# Patient Record
Sex: Male | Born: 1935 | Race: White | Hispanic: No | Smoking: Former smoker
Health system: Southern US, Community
[De-identification: ages and names within clinical notes are randomized; demographics above are authoritative.]

## PROBLEM LIST (undated history)

## (undated) DIAGNOSIS — Z8739 Personal history of other diseases of the musculoskeletal system and connective tissue: Secondary | ICD-10-CM

## (undated) DIAGNOSIS — I441 Atrioventricular block, second degree: Secondary | ICD-10-CM

## (undated) DIAGNOSIS — I10 Essential (primary) hypertension: Secondary | ICD-10-CM

## (undated) DIAGNOSIS — Z87442 Personal history of urinary calculi: Secondary | ICD-10-CM

## (undated) DIAGNOSIS — E785 Hyperlipidemia, unspecified: Secondary | ICD-10-CM

## (undated) DIAGNOSIS — M4310 Spondylolisthesis, site unspecified: Secondary | ICD-10-CM

## (undated) DIAGNOSIS — E119 Type 2 diabetes mellitus without complications: Secondary | ICD-10-CM

## (undated) DIAGNOSIS — I4510 Unspecified right bundle-branch block: Secondary | ICD-10-CM

## (undated) DIAGNOSIS — IMO0001 Reserved for inherently not codable concepts without codable children: Secondary | ICD-10-CM

## (undated) DIAGNOSIS — Z950 Presence of cardiac pacemaker: Secondary | ICD-10-CM

## (undated) DIAGNOSIS — N183 Chronic kidney disease, stage 3 unspecified: Secondary | ICD-10-CM

## (undated) DIAGNOSIS — D649 Anemia, unspecified: Secondary | ICD-10-CM

## (undated) DIAGNOSIS — M1990 Unspecified osteoarthritis, unspecified site: Secondary | ICD-10-CM

## (undated) DIAGNOSIS — J189 Pneumonia, unspecified organism: Secondary | ICD-10-CM

## (undated) DIAGNOSIS — C679 Malignant neoplasm of bladder, unspecified: Secondary | ICD-10-CM

## (undated) DIAGNOSIS — H9190 Unspecified hearing loss, unspecified ear: Secondary | ICD-10-CM

## (undated) HISTORY — DX: Anemia, unspecified: D64.9

## (undated) HISTORY — PX: EXCISIONAL HEMORRHOIDECTOMY: SHX1541

## (undated) HISTORY — DX: Unspecified right bundle-branch block: I45.10

## (undated) HISTORY — PX: CYSTOSCOPY WITH LITHOLAPAXY: SHX1425

## (undated) HISTORY — PX: REFRACTIVE SURGERY: SHX103

---

## 2001-07-17 ENCOUNTER — Emergency Department (HOSPITAL_COMMUNITY): Admission: EM | Admit: 2001-07-17 | Discharge: 2001-07-17 | Payer: Self-pay

## 2002-03-13 ENCOUNTER — Encounter: Admission: RE | Admit: 2002-03-13 | Discharge: 2002-06-11 | Payer: Self-pay | Admitting: Internal Medicine

## 2004-03-26 HISTORY — PX: COLONOSCOPY: SHX174

## 2009-02-08 ENCOUNTER — Encounter (INDEPENDENT_AMBULATORY_CARE_PROVIDER_SITE_OTHER): Payer: Self-pay | Admitting: *Deleted

## 2009-07-11 HISTORY — PX: US ECHOCARDIOGRAPHY: HXRAD669

## 2011-02-26 ENCOUNTER — Other Ambulatory Visit: Payer: Self-pay | Admitting: Urology

## 2011-02-26 ENCOUNTER — Ambulatory Visit
Admission: RE | Admit: 2011-02-26 | Discharge: 2011-02-26 | Disposition: A | Discharge status: Home / Self Care | Payer: MEDICARE | Source: Ambulatory Visit | Attending: Urology | Admitting: Urology

## 2011-02-26 DIAGNOSIS — Z01811 Encounter for preprocedural respiratory examination: Secondary | ICD-10-CM

## 2011-02-26 DIAGNOSIS — D494 Neoplasm of unspecified behavior of bladder: Secondary | ICD-10-CM

## 2011-03-02 ENCOUNTER — Other Ambulatory Visit: Payer: Self-pay | Admitting: Urology

## 2011-03-02 ENCOUNTER — Ambulatory Visit (HOSPITAL_BASED_OUTPATIENT_CLINIC_OR_DEPARTMENT_OTHER)
Admission: RE | Admit: 2011-03-02 | Discharge: 2011-03-02 | Disposition: A | Discharge status: Home / Self Care | Payer: MEDICARE | Source: Ambulatory Visit | Attending: Urology | Admitting: Urology

## 2011-03-02 DIAGNOSIS — Z01812 Encounter for preprocedural laboratory examination: Secondary | ICD-10-CM | POA: Insufficient documentation

## 2011-03-02 DIAGNOSIS — R9431 Abnormal electrocardiogram [ECG] [EKG]: Secondary | ICD-10-CM | POA: Insufficient documentation

## 2011-03-02 DIAGNOSIS — C671 Malignant neoplasm of dome of bladder: Secondary | ICD-10-CM | POA: Insufficient documentation

## 2011-03-02 DIAGNOSIS — R310 Gross hematuria: Secondary | ICD-10-CM | POA: Insufficient documentation

## 2011-03-02 DIAGNOSIS — Z01810 Encounter for preprocedural cardiovascular examination: Secondary | ICD-10-CM | POA: Insufficient documentation

## 2011-03-02 HISTORY — PX: TRANSURETHRAL RESECTION OF BLADDER TUMOR: SHX2575

## 2011-03-02 LAB — POCT I-STAT, CHEM 8
BUN: 29 mg/dL — ABNORMAL HIGH (ref 6–23)
Calcium, Ion: 1.36 mmol/L — ABNORMAL HIGH (ref 1.12–1.32)
Chloride: 111 mEq/L (ref 96–112)
Creatinine, Ser: 1.3 mg/dL (ref 0.4–1.5)
Glucose, Bld: 118 mg/dL — ABNORMAL HIGH (ref 70–99)
HCT: 39 % (ref 39.0–52.0)
Hemoglobin: 13.3 g/dL (ref 13.0–17.0)
Potassium: 4.3 mEq/L (ref 3.5–5.1)
Sodium: 143 mEq/L (ref 135–145)
TCO2: 20 mmol/L (ref 0–100)

## 2011-03-05 NOTE — Op Note (Signed)
  Keith Drake, Keith Drake               ACCOUNT NO.:  [id number]  MEDICAL RECORD NO.:  [id number]           PATIENT TYPE:  LOCATION:                                 FACILITY:  PHYSICIAN:  Timmie Dugue C. Karsten Ro, M.D.       DATE OF BIRTH:  DATE OF PROCEDURE:  03/02/2011 DATE OF DISCHARGE:                              OPERATIVE REPORT   PREOPERATIVE DIAGNOSIS:  Bladder tumor.  POSTOPERATIVE DIAGNOSIS:  Bladder tumor.  PROCEDURE:  Transurethral resection of bladder tumor (2.5 cm).  SURGEON:  Vegas Fritze C. Karsten Ro, M.D.  ANESTHESIA:  General.  DRAINS:  None.  SPECIMENS:  Portions of bladder tumor to pathology.  BLOOD LOSS:  Minimal.  COMPLICATIONS:  None.  INDICATIONS:  The patient is a 76 year old male who had an episode of gross hematuria.  He was found by CT scan to have no abnormality of the upper tract, however, cystoscopically I noted what appeared to be a papillary/sessile tumor involving the dome of the bladder.  No other tumors were found within the bladder other than the single tumor at the dome and I, therefore, recommended transurethral resection of this tumor.  I have discussed the risks, complications and alternatives with the patient and he understands and has elected to proceed.  DESCRIPTION OF OPERATION:  After informed consent, the patient was brought to the major OR, placed on the table and administered general anesthesia.  He was then moved to the dorsal lithotomy position and his genitalia was sterilely prepped and draped.  An official time-out was then performed.  The 28-French resectoscope sheath with Timberlake obturator was introduced into the bladder and the obturator removed and replaced with the resectoscope element and 12-degree lens.  A full, systematic inspection of the bladder was then undertaken.  Ureteral orifices were noted to be of normal configuration and position.  There was 3+ trabeculation of the bladder noted as well.  The tumor was identified  at the dome of the bladder again and appeared to have a papillary as well as sessile component.  I resected the bladder tumor from the dome of the bladder and then fulgurated the surrounding mucosa and base of the tumor.  I then used the Microvasive evacuator to remove the portions of bladder tumor and these were sent to pathology.  Reinspection of the bladder revealed no bleeding, perforation, or other abnormality.  I, therefore, drained the bladder, removed the resectoscope and the patient was awakened and taken to recovery room in stable and satisfactory condition.  He tolerated the procedure well with no intraoperative complications.  He will be given a prescription for Pyridium 200 mg #30 and Vicodin HP #28 with followup in my office in 1 week to discuss the results of pathology and in addition written discharge instructions were given at the time of discharge.     Kree Rafter C. Karsten Ro, M.D.     MCO/MEDQ  D:  03/02/2011  T:  03/02/2011  Job:  [id number]  Electronically Signed by Kathie Rhodes M.D. on 03/05/2011 04:30:33 AM

## 2011-05-01 ENCOUNTER — Ambulatory Visit (HOSPITAL_BASED_OUTPATIENT_CLINIC_OR_DEPARTMENT_OTHER)
Admission: RE | Admit: 2011-05-01 | Discharge: 2011-05-01 | Disposition: A | Discharge status: Home / Self Care | Payer: Medicare Other | Source: Ambulatory Visit | Attending: Urology | Admitting: Urology

## 2011-05-01 ENCOUNTER — Other Ambulatory Visit: Payer: Self-pay | Admitting: Urology

## 2011-05-01 DIAGNOSIS — C671 Malignant neoplasm of dome of bladder: Secondary | ICD-10-CM | POA: Insufficient documentation

## 2011-05-01 DIAGNOSIS — I10 Essential (primary) hypertension: Secondary | ICD-10-CM | POA: Insufficient documentation

## 2011-05-01 DIAGNOSIS — I4510 Unspecified right bundle-branch block: Secondary | ICD-10-CM | POA: Insufficient documentation

## 2011-05-01 DIAGNOSIS — E119 Type 2 diabetes mellitus without complications: Secondary | ICD-10-CM | POA: Insufficient documentation

## 2011-05-01 DIAGNOSIS — Z794 Long term (current) use of insulin: Secondary | ICD-10-CM | POA: Insufficient documentation

## 2011-05-01 DIAGNOSIS — I441 Atrioventricular block, second degree: Secondary | ICD-10-CM | POA: Insufficient documentation

## 2011-05-01 DIAGNOSIS — R310 Gross hematuria: Secondary | ICD-10-CM | POA: Insufficient documentation

## 2011-05-01 DIAGNOSIS — N400 Enlarged prostate without lower urinary tract symptoms: Secondary | ICD-10-CM | POA: Insufficient documentation

## 2011-05-01 DIAGNOSIS — I498 Other specified cardiac arrhythmias: Secondary | ICD-10-CM | POA: Insufficient documentation

## 2011-05-01 DIAGNOSIS — Z01812 Encounter for preprocedural laboratory examination: Secondary | ICD-10-CM | POA: Insufficient documentation

## 2011-05-01 HISTORY — PX: OTHER SURGICAL HISTORY: SHX169

## 2011-05-01 LAB — POCT I-STAT 4, (NA,K, GLUC, HGB,HCT)
Glucose, Bld: 112 mg/dL — ABNORMAL HIGH (ref 70–99)
HCT: 40 % (ref 39.0–52.0)
Hemoglobin: 13.6 g/dL (ref 13.0–17.0)
Potassium: 3.9 mEq/L (ref 3.5–5.1)
Sodium: 141 mEq/L (ref 135–145)

## 2011-05-01 LAB — GLUCOSE, CAPILLARY: Glucose-Capillary: 137 mg/dL — ABNORMAL HIGH (ref 70–99)

## 2011-05-06 NOTE — Op Note (Signed)
Keith Drake, Keith Drake               ACCOUNT NO.:  [id number]  MEDICAL RECORD NO.:  [id number]  LOCATION:                                 FACILITY:  PHYSICIAN:  Yehonatan Grandison C. Karsten Drake, M.D.       DATE OF BIRTH:  DATE OF PROCEDURE:  05/01/2011 DATE OF DISCHARGE:                              OPERATIVE REPORT   PREOPERATIVE DIAGNOSIS:  History of transitional cell carcinoma of the bladder.  POSTOPERATIVE DIAGNOSIS:  History of transitional cell carcinoma of the bladder.  PROCEDURE:  Cystoscopy with cold cup and resectional biopsies.  SURGEON:  Doneshia Hill C. Karsten Drake, M.D.  ANESTHESIA:  General.  SPECIMENS: 1. Cold cup biopsies of the previous tumor site. 2. Resectional biopsies of previous tumor site.  These were sent to     pathology.  BLOOD LOSS:  Less than 5 cc.  DRAINS:  None.  COMPLICATIONS:  None.  INDICATIONS:  The patient is a 76 year old male who was found on workup of gross hematuria to have a transitional cell carcinoma involving the dome of the bladder.  This was resected on Mar 02, 2011, and was found to be pathologically high-grade transitional cell carcinoma with invasion into the lamina propria.  We therefore discussed the need for repeat biopsy and deeper resection into the muscularis to be sure he was free of tumor.  The risks, complications, alternatives, and limitations were discussed.  The patient understands and has elected to proceed.  DESCRIPTION OF OPERATION:  After informed consent, the patient was brought to the major OR, placed on table, and administered general anesthesia, and then moved to the dorsal lithotomy position.  His genitalia was sterilely prepped and draped and an official time-out was then performed.  The 22-French cystoscope was then passed under direct vision using 12 degree lens down the urethra which was normal.  The prostatic urethra revealed elongation and bilobar hypertrophy but no lesions were identified.  Upon entering the bladder,  again I noted some trabeculation.  Normal ureteral orifices and using both the 12 and 70 degree lens, the bladder was fully and systematically inspected and the only area of concern was at the dome where I previously resected his bladder tumor.  It appeared to be possibly inflamed as to be expected after having undergone resection in that location fairly recently.  The cold cup biopsy forceps were then introduced and I obtained cold cup biopsies from 3 different locations near the periphery of the previous resection site that appeared possibly suspicious.  I then removed the cystoscope and inserted the resectoscope.  The resectoscope was then used to resect deeper into the base of the previous tumor location and then I used the resectoscope to fulgurate both the base and surrounding mucosa.  No bleeding was noted at the end of the procedure.  I used the Microvasive evacuator to remove all the resected portions of the bladder and these were sent to pathology separately.  The resectoscope was removed after the bladder was drained and the patient was awakened and taken to recovery room in stable and satisfactory condition.  He tolerated the procedure well and there were no intraoperative complications.  He was given written instructions on  discharge and will follow up with me in a week and was given prescriptions for Pyridium 200 #20 and Vicodin #20.     Keith Drake, M.D.     MCO/MEDQ  D:  05/01/2011  T:  05/01/2011  Job:  [id number]  Electronically Signed by Kathie Rhodes M.D. on 05/06/2011 04:48:58 AM

## 2011-09-10 ENCOUNTER — Other Ambulatory Visit: Payer: Self-pay | Admitting: Urology

## 2011-10-01 ENCOUNTER — Encounter (HOSPITAL_BASED_OUTPATIENT_CLINIC_OR_DEPARTMENT_OTHER): Payer: Self-pay | Admitting: *Deleted

## 2011-10-02 ENCOUNTER — Encounter (HOSPITAL_BASED_OUTPATIENT_CLINIC_OR_DEPARTMENT_OTHER): Payer: Self-pay | Admitting: *Deleted

## 2011-10-02 NOTE — Progress Notes (Signed)
NPO AFTER MN. PT ARRIVES AT 0615. NEEDS ISTAT. CURRENT EKG W/ CHART.

## 2011-10-04 NOTE — H&P (Signed)
History of Present Illness      Transitional cell carcinoma of the bladder: He had an episode of gross hematuria and CT scan revealed no abnormality of the upper tracts as well as no pelvic lymphadenopathy. He did have bilateral renal cysts as well as a left lower pole calculus however cystoscopically I found a bladder tumor at the dome of his bladder. On 03/02/11 he underwent TURBT and pathologically was found to have high-grade transitional cell carcinoma with invasion into the lamina propria (stage T1,G3).      Nephrolithiasis: He underwent lithotripsy approximately 20 years ago with no further recurrence of stones.  BPH with outlet obstruction: He was found to have a thickened bladder wall by CT scan consistent with long-standing outlet obstruction which was confirmed cystoscopically with the finding of 3+ trabeculation and bilobar hypertrophy of the prostate.  Nephrolithiasis: He was found on a CT scan done 02/17/11 to have a single nonobstructing stone in the lower pole of his left kidney. No renal calculi were identified.   Past Medical History Problems  1. History of  Diabetes Mellitus 250.00 2. History of  Essential Hypertension 401.9 3. History of  Gout 274.9 4. History of  Hypercholesterolemia 272.0 5. History of  Nephrolithiasis V13.01  Surgical History Problems  1. History of  Cystoscopy With Fulguration Medium Lesion (2-5cm)  Current Meds 1. Allopurinol 300 MG Oral Tablet; Therapy: 29Sep2011 to 2. Crestor 40 MG Oral Tablet; Therapy: [id number] to 3. Glimepiride 4 MG Oral Tablet; Therapy: 28Feb2011 to 4. Januvia 100 MG Oral Tablet; Therapy: [id number] to 5. Lantus SoloStar 100 UNIT/ML Subcutaneous Solution; Therapy: 08Sep2011 to 6. Losartan Potassium 100 MG Oral Tablet; Therapy: [id number] to 7. MetFORMIN HCl 1000 MG Oral Tablet; Therapy: [id number] to 8. Zetia 10 MG Oral Tablet; Therapy: [id number] to  Allergies Medication  1. No Known Drug Allergies  Family  History Problems  1. Family history of  Death In The Family Father died age 41-old age 60. Family history of  Death In The Family Mother died age 58-Hit by drunk driver 3. Family history of  Family Health Status Number Of Children 2 sons 4. Fraternal history of  Nephrolithiasis  Social History Problems  1. Caffeine Use 2 per day 2. Marital History - Currently Married 3. Occupation: Retired 85. Tobacco Use Occassional cigar use Denied  5. History of  Alcohol  Vitals Vital Signs [Data Includes: Last 1 Day]  [id number] 08:30AM  BMI Calculated: 28.92 BSA Calculated: 2.1 Height: 5 ft 10 in Weight: 202 lb  Blood Pressure: 117 / 85 Heart Rate: 66  Physical Exam Constitutional: Well nourished and well developed . No acute distress. The patient appears well hydrated.  ENT:. The ears and nose are normal in appearance.  Neck: The appearance of the neck is normal.  Pulmonary: No respiratory distress.  Cardiovascular: Heart rate and rhythm are normal.  Abdomen: The abdomen is flat. The abdomen is soft and nontender. No suprapubic tenderness. No CVA tenderness. Bowel sounds are normal. No hernias are palpable.  Rectal: Rectal exam demonstrates normal sphincter tone, the anus is normal on inspection. and no tenderness. Estimated prostate size is 2+. Normal rectal tone, no rectal masses, prostate is smooth, symmetric and non-tender. The prostate has no nodularity, is not indurated, is not tender and is not fluctuant. The perineum is normal on inspection, no perineal tenderness.  Genitourinary: Examination of the penis demonstrates no adherence of the prepuce and a normal meatus. The penis is uncircumcised. The scrotum is normal in appearance.  The right testis is palpably normal, not enlarged and non-tender. The left testis is normal, not enlarged and non-tender.  Lymphatics: The femoral and inguinal nodes are not enlarged or tender.  Skin: Normal skin turgor and normal skin color and  pigmentation.  Neuro/Psych:. Mood and affect are appropriate.    Results/Data  The following images/tracing/specimen were independently visualized:  Renal US: 11.42 X 1.03 X 6.26 X 6.23 cm. Upper pole cyst: 1.15 X 0.58 X 0.61 cm. Left kidney: 11.40 X 1.12 X 5.21 X 5.07 cm. Lower pole cyst: 0.85 X 0.78 X 0.77 cm. Cyst with calcifications: 0.81 X 0.61 X 0.97 cm. Bilateral hyperechoic areas w/w/o shadowing.  The following clinical lab reports were reviewed:  UA NMP-22 PSA BUN/creatitine.  PVR: Ultrasound PVR 192.85 ml.  Assessment Assessed  1. Transitional Cell Carcinoma Of The Bladder 188.9      His repeat resection has revealed residual disease that unfortunately is T1,G3 with lymphovascular invasion present. I first discussed with him the fact that this places him at an increased risk for recurrence as well as an increased risk for progression. We therefore discussed the treatment options which would be proceeding with cystectomy at this time or chemoradiation versus induction BCG with repeat resection. He would like to try to avoid either of these first 2 options and we therefore discussed proceeding with full-strength BCG induction therapy for 6 weeks followed by a repeat resection. I went over BCG with him as well as its potential risks and complications. He understands and has elected to proceed in that fashion.  Plan: He presents today to undergo cystoscopy with bladder biopsies to evaluate for the presence of any residual disease status post induction course of BCG.

## 2011-10-05 ENCOUNTER — Encounter (HOSPITAL_BASED_OUTPATIENT_CLINIC_OR_DEPARTMENT_OTHER): Payer: Self-pay | Admitting: *Deleted

## 2011-10-05 ENCOUNTER — Encounter (HOSPITAL_BASED_OUTPATIENT_CLINIC_OR_DEPARTMENT_OTHER): Payer: Self-pay | Admitting: Anesthesiology

## 2011-10-05 ENCOUNTER — Ambulatory Visit (HOSPITAL_BASED_OUTPATIENT_CLINIC_OR_DEPARTMENT_OTHER)
Admission: RE | Admit: 2011-10-05 | Discharge: 2011-10-05 | Disposition: A | Discharge status: Home / Self Care | Payer: Medicare Other | Source: Ambulatory Visit | Attending: Urology | Admitting: Urology

## 2011-10-05 ENCOUNTER — Ambulatory Visit (HOSPITAL_BASED_OUTPATIENT_CLINIC_OR_DEPARTMENT_OTHER): Payer: Medicare Other | Admitting: Anesthesiology

## 2011-10-05 ENCOUNTER — Other Ambulatory Visit: Payer: Self-pay | Admitting: Urology

## 2011-10-05 ENCOUNTER — Encounter (HOSPITAL_BASED_OUTPATIENT_CLINIC_OR_DEPARTMENT_OTHER)
Admission: RE | Disposition: A | Discharge status: Home / Self Care | Payer: Self-pay | Source: Ambulatory Visit | Attending: Urology

## 2011-10-05 DIAGNOSIS — E119 Type 2 diabetes mellitus without complications: Secondary | ICD-10-CM | POA: Insufficient documentation

## 2011-10-05 DIAGNOSIS — C679 Malignant neoplasm of bladder, unspecified: Secondary | ICD-10-CM | POA: Insufficient documentation

## 2011-10-05 DIAGNOSIS — Z87442 Personal history of urinary calculi: Secondary | ICD-10-CM | POA: Insufficient documentation

## 2011-10-05 DIAGNOSIS — N401 Enlarged prostate with lower urinary tract symptoms: Secondary | ICD-10-CM | POA: Insufficient documentation

## 2011-10-05 DIAGNOSIS — M109 Gout, unspecified: Secondary | ICD-10-CM | POA: Insufficient documentation

## 2011-10-05 DIAGNOSIS — I10 Essential (primary) hypertension: Secondary | ICD-10-CM | POA: Insufficient documentation

## 2011-10-05 DIAGNOSIS — E7800 Pure hypercholesterolemia, unspecified: Secondary | ICD-10-CM | POA: Insufficient documentation

## 2011-10-05 DIAGNOSIS — N138 Other obstructive and reflux uropathy: Secondary | ICD-10-CM | POA: Insufficient documentation

## 2011-10-05 DIAGNOSIS — Z79899 Other long term (current) drug therapy: Secondary | ICD-10-CM | POA: Insufficient documentation

## 2011-10-05 HISTORY — PX: CYSTOSCOPY WITH BIOPSY: SHX5122

## 2011-10-05 HISTORY — DX: Hyperlipidemia, unspecified: E78.5

## 2011-10-05 HISTORY — DX: Personal history of other diseases of the musculoskeletal system and connective tissue: Z87.39

## 2011-10-05 HISTORY — DX: Essential (primary) hypertension: I10

## 2011-10-05 HISTORY — DX: Reserved for inherently not codable concepts without codable children: IMO0001

## 2011-10-05 HISTORY — DX: Unspecified osteoarthritis, unspecified site: M19.90

## 2011-10-05 HISTORY — DX: Unspecified hearing loss, unspecified ear: H91.90

## 2011-10-05 LAB — POCT I-STAT 4, (NA,K, GLUC, HGB,HCT)
Glucose, Bld: 103 mg/dL — ABNORMAL HIGH (ref 70–99)
Glucose, Bld: 113 mg/dL — ABNORMAL HIGH (ref 70–99)
HCT: 41 % (ref 39.0–52.0)
HCT: 38 % — ABNORMAL LOW (ref 39.0–52.0)
Hemoglobin: 13.9 g/dL (ref 13.0–17.0)
Hemoglobin: 12.9 g/dL — ABNORMAL LOW (ref 13.0–17.0)
Potassium: 5.6 mEq/L — ABNORMAL HIGH (ref 3.5–5.1)
Potassium: 3.9 mEq/L (ref 3.5–5.1)
Sodium: 142 mEq/L (ref 135–145)
Sodium: 143 mEq/L (ref 135–145)

## 2011-10-05 LAB — GLUCOSE, CAPILLARY: Glucose-Capillary: 78 mg/dL (ref 70–99)

## 2011-10-05 SURGERY — CYSTOSCOPY, WITH BIOPSY
Anesthesia: General | Site: Bladder | Wound class: Clean Contaminated

## 2011-10-05 MED ORDER — LACTATED RINGERS IV SOLN
INTRAVENOUS | Status: DC
  Administered 2011-10-05 (×3): via INTRAVENOUS

## 2011-10-05 MED ORDER — STERILE WATER FOR IRRIGATION IR SOLN
Status: DC | PRN
  Administered 2011-10-05: 1

## 2011-10-05 MED ORDER — GLYCOPYRROLATE 0.2 MG/ML IJ SOLN
INTRAMUSCULAR | Status: DC | PRN
  Administered 2011-10-05: 0.2 mg via INTRAVENOUS

## 2011-10-05 MED ORDER — PROMETHAZINE HCL 25 MG/ML IJ SOLN: 6.2500 mg | INTRAMUSCULAR | Status: DC | PRN

## 2011-10-05 MED ORDER — PROPOFOL 10 MG/ML IV EMUL
INTRAVENOUS | Status: DC | PRN
  Administered 2011-10-05: 50 mg via INTRAVENOUS
  Administered 2011-10-05: 200 mg via INTRAVENOUS
  Administered 2011-10-05: 50 mg via INTRAVENOUS

## 2011-10-05 MED ORDER — PHENAZOPYRIDINE HCL 200 MG PO TABS: 200.0000 mg | ORAL_TABLET | Freq: Three times a day (TID) | ORAL | Status: AC | PRN

## 2011-10-05 MED ORDER — FENTANYL CITRATE 0.05 MG/ML IJ SOLN
INTRAMUSCULAR | Status: DC | PRN
  Administered 2011-10-05 (×3): 50 ug via INTRAVENOUS

## 2011-10-05 MED ORDER — ONDANSETRON HCL 4 MG/2ML IJ SOLN
INTRAMUSCULAR | Status: DC | PRN
  Administered 2011-10-05: 4 mg via INTRAVENOUS

## 2011-10-05 MED ORDER — CIPROFLOXACIN IN D5W 200 MG/100ML IV SOLN
200.0000 mg | INTRAVENOUS | Status: AC
  Administered 2011-10-05: 200 mg via INTRAVENOUS

## 2011-10-05 MED ORDER — LIDOCAINE HCL (CARDIAC) 20 MG/ML IV SOLN
INTRAVENOUS | Status: DC | PRN
  Administered 2011-10-05: 60 mg via INTRAVENOUS

## 2011-10-05 MED ORDER — FENTANYL CITRATE 0.05 MG/ML IJ SOLN: 25.0000 ug | INTRAMUSCULAR | Status: DC | PRN

## 2011-10-05 MED ORDER — LACTATED RINGERS IV SOLN: INTRAVENOUS | Status: DC

## 2011-10-05 MED ORDER — PHENAZOPYRIDINE HCL 200 MG PO TABS
200.0000 mg | ORAL_TABLET | Freq: Once | ORAL | Status: AC
  Administered 2011-10-05: 200 mg via ORAL

## 2011-10-05 SURGICAL SUPPLY — 18 items
BAG DRAIN URO-CYSTO SKYTR STRL (DRAIN) ×2 IMPLANT
BAG DRN UROCATH (DRAIN) ×1
CANISTER SUCT LVC 12 LTR MEDI- (MISCELLANEOUS) ×1 IMPLANT
CLOTH BEACON ORANGE TIMEOUT ST (SAFETY) ×2 IMPLANT
DRAPE CAMERA CLOSED 9X96 (DRAPES) ×2 IMPLANT
ELECT REM PT RETURN 9FT ADLT (ELECTROSURGICAL) ×2
ELECTRODE REM PT RTRN 9FT ADLT (ELECTROSURGICAL) ×1 IMPLANT
GLOVE BIO SURGEON STRL SZ8 (GLOVE) ×2 IMPLANT
GLOVE BIOGEL PI IND STRL 6.5 (GLOVE) IMPLANT
GLOVE BIOGEL PI INDICATOR 6.5 (GLOVE) ×2
GOWN BRE IMP SLV AUR LG STRL (GOWN DISPOSABLE) ×1 IMPLANT
GOWN STRL REIN XL XLG (GOWN DISPOSABLE) ×2 IMPLANT
GOWN XL W/COTTON TOWEL STD (GOWNS) ×2 IMPLANT
IV NS IRRIG 3000ML ARTHROMATIC (IV SOLUTION) ×1 IMPLANT
NEEDLE HYPO 22GX1.5 SAFETY (NEEDLE) IMPLANT
NS IRRIG 500ML POUR BTL (IV SOLUTION) IMPLANT
PACK CYSTOSCOPY (CUSTOM PROCEDURE TRAY) ×2 IMPLANT
WATER STERILE IRR 3000ML UROMA (IV SOLUTION) ×2 IMPLANT

## 2011-10-05 NOTE — Anesthesia Preprocedure Evaluation (Addendum)
Anesthesia Evaluation  Patient identified by MRN, date of birth, ID band Patient awake    Reviewed: Allergy & Precautions, H&P , NPO status , Patient's Chart, lab work & pertinent test results  Airway Mallampati: IV TM Distance: >3 FB Neck ROM: Full    Dental No notable dental hx.    Pulmonary neg pulmonary ROS,  clear to auscultation  Pulmonary exam normal       Cardiovascular hypertension, Pt. on medications neg cardio ROS Regular Normal    Neuro/Psych Negative Neurological ROS  Negative Psych ROS   GI/Hepatic negative GI ROS, Neg liver ROS,   Endo/Other  Negative Endocrine ROSDiabetes mellitus-, Type 2, Oral Hypoglycemic Agents  Renal/GU negative Renal ROS  Genitourinary negative   Musculoskeletal negative musculoskeletal ROS (+)   Abdominal   Peds negative pediatric ROS (+)  Hematology negative hematology ROS (+)   Anesthesia Other Findings   Reproductive/Obstetrics negative OB ROS                          Anesthesia Physical Anesthesia Plan  ASA: III  Anesthesia Plan: General   Post-op Pain Management:    Induction: Intravenous  Airway Management Planned: LMA  Additional Equipment:   Intra-op Plan:   Post-operative Plan: Extubation in OR  Informed Consent: I have reviewed the patients History and Physical, chart, labs and discussed the procedure including the risks, benefits and alternatives for the proposed anesthesia with the patient or authorized representative who has indicated his/her understanding and acceptance.   Dental advisory given  Plan Discussed with: CRNA  Anesthesia Plan Comments:        Anesthesia Quick Evaluation

## 2011-10-05 NOTE — Op Note (Signed)
PATIENT:  Keith Drake  PRE-OPERATIVE DIAGNOSIS: History of transitional cell carcinoma of the bladder.  POST-OPERATIVE DIAGNOSIS: Same  PROCEDURE:  Procedure(s):  cystoscopy with bladder biopsy  SURGEON:  Surgeon(s): Claybon Jabs  ANESTHESIA:   General  EBL:  Minimal  DRAINS:  none  SPECIMEN:  Source of Specimen: bladder biopsies   DISPOSITION OF SPECIMEN:  PATHOLOGY  Indication: He had an episode of gross hematuria and was evaluated with a CT scan which revealed no abnormality of the upper tracts. He was found to have a bladder tumor and underwent TURBT on 03/02/11 and was found to have high-grade transitional cell carcinoma with invasion into the lamina propria but not into the muscularis (stage TI, G3). He underwent an six-week induction course of BCG and presents today for reevaluation with cystoscopy and bladder biopsies.  Description of operation: The patient was taken to the operating room and administered general anesthesia. He was then placed on the table and moved to the dorsal lithotomy position after which his genitalia was sterilely prepped and draped. An official timeout was then performed.  The 61 French cystoscope with 12 lens was then passed under direct vision down the urethra which was noted be entirely normal. The prostatic urethra was free of lesions and the bladder was entered. The ureteral orifices were noted to be of normal configuration and position. There was 3-4+ trabeculation of the bladder and this was photographed. I did a systematic inspection of the entire bladder with both the 12 and 70 lenses. I noted no papillary tumors. There was an area of slight erythema located at the dome/anterior bladder wall it was best seen with the 70 lens. No other lesions were identified within the bladder.  The cold cup biopsy forceps were then passed through the cystoscope and a biopsy from the area of his previous tumor was obtained as well as a biopsy from an area  near the dome as well. These were sent to pathology. I then used the 70 lens and deflecting bridge to better visualize the area and it was fulgurated with Bugbee electrode. I made sure that I fulgurated all of the area that appeared slightly redder than the surrounding mucosa. Once this was completely fulgurated I drained the bladder, removed the cystoscope and the patient was awakened and taken to recovery room. He tolerated the procedure well and there were no intraoperative complications.  PLAN OF CARE: Discharge to home after PACU  PATIENT DISPOSITION:  PACU - hemodynamically stable.

## 2011-10-05 NOTE — Anesthesia Procedure Notes (Signed)
Procedure Name: LMA Insertion Date/Time: 10/05/2011 7:47 AM Performed by: Edwyna Perfect Pre-anesthesia Checklist: Patient identified, Emergency Drugs available, Suction available and Patient being monitored Patient Re-evaluated:Patient Re-evaluated prior to inductionOxygen Delivery Method: Circle System Utilized Preoxygenation: Pre-oxygenation with 100% oxygen Intubation Type: IV induction Ventilation: Mask ventilation without difficulty LMA: LMA with gastric port inserted LMA Size: 4.0 Number of attempts: 1 Placement Confirmation: positive ETCO2 Tube secured with: Tape Dental Injury: Teeth and Oropharynx as per pre-operative assessment

## 2011-10-05 NOTE — Anesthesia Postprocedure Evaluation (Signed)
  Anesthesia Post-op Note  Patient: Keith Drake  Procedure(s) Performed:  CYSTOSCOPY WITH BIOPSY - BLADDER BIOPSY GYRUS DIABETIC  Patient Location: PACU  Anesthesia Type: General  Level of Consciousness: awake and alert   Airway and Oxygen Therapy: Patient Spontanous Breathing  Post-op Pain: mild  Post-op Assessment: Post-op Vital signs reviewed, Patient's Cardiovascular Status Stable, Respiratory Function Stable, Patent Airway and No signs of Nausea or vomiting  Post-op Vital Signs: stable  Complications: No apparent anesthesia complications

## 2011-10-05 NOTE — Transfer of Care (Signed)
Immediate Anesthesia Transfer of Care Note  Patient: Keith Drake  Procedure(s) Performed:  CYSTOSCOPY WITH BIOPSY - BLADDER BIOPSY GYRUS DIABETIC  Patient Location: Patient transported to PACU with oxygen via face mask at 6 Liters / Min  Anesthesia Type: General  Level of Consciousness:  alert and sedated Airway & Oxygen Therapy: Patient Spontanous Breathing and Patient connected to face mask oxygen  Post-op Assessment: Report given to PACU RN and Post -op Vital signs reviewed and stable  Post vital signs: Reviewed and stable  Complications: No apparent anesthesia complications

## 2011-10-06 ENCOUNTER — Encounter (HOSPITAL_BASED_OUTPATIENT_CLINIC_OR_DEPARTMENT_OTHER): Payer: Self-pay | Admitting: Urology

## 2012-01-11 ENCOUNTER — Encounter: Payer: Self-pay | Admitting: *Deleted

## 2013-02-28 ENCOUNTER — Encounter: Payer: Self-pay | Admitting: *Deleted

## 2013-03-23 ENCOUNTER — Encounter: Payer: Self-pay | Admitting: Cardiovascular Disease

## 2013-03-23 ENCOUNTER — Ambulatory Visit (INDEPENDENT_AMBULATORY_CARE_PROVIDER_SITE_OTHER): Payer: Medicare Other | Admitting: Cardiovascular Disease

## 2013-03-23 ENCOUNTER — Encounter (HOSPITAL_COMMUNITY): Payer: Self-pay | Admitting: Cardiovascular Disease

## 2013-03-23 VITALS — BP 110/70 | HR 55 | Ht 70.0 in | Wt 201.0 lb

## 2013-03-23 DIAGNOSIS — I10 Essential (primary) hypertension: Secondary | ICD-10-CM

## 2013-03-23 DIAGNOSIS — Z794 Long term (current) use of insulin: Secondary | ICD-10-CM

## 2013-03-23 DIAGNOSIS — I4510 Unspecified right bundle-branch block: Secondary | ICD-10-CM | POA: Insufficient documentation

## 2013-03-23 DIAGNOSIS — E119 Type 2 diabetes mellitus without complications: Secondary | ICD-10-CM

## 2013-03-23 DIAGNOSIS — R0789 Other chest pain: Secondary | ICD-10-CM

## 2013-03-23 DIAGNOSIS — IMO0001 Reserved for inherently not codable concepts without codable children: Secondary | ICD-10-CM | POA: Insufficient documentation

## 2013-03-23 DIAGNOSIS — E785 Hyperlipidemia, unspecified: Secondary | ICD-10-CM

## 2013-03-23 NOTE — Assessment & Plan Note (Signed)
On statin therapy followed by his premature physician

## 2013-03-23 NOTE — Patient Instructions (Signed)
  Your physician wants you to follow-up with him in : 13 months                                              Your physician has ordered the following tests: [facility] has requested that you have an [place]. For further information please visit [email or web address]. Please follow instruction sheet, as given.

## 2013-03-23 NOTE — Assessment & Plan Note (Signed)
Under good control of her medications

## 2013-03-23 NOTE — Progress Notes (Signed)
03/23/2013 [name]   Mar 02, 1935  [id number]  Primary Physician Jerlyn Ly, MD Primary Cardiologist: Lorretta Harp MD Renae Gloss   HPI:  Mr. Watwood is a 78 year old mildly overweight divorced African American male father of 2, grandfather to 5 grandchildren accompanied by his male companion today. 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 He was referred by Dr. Joylene Draft for cardiovascular evaluation because of right bundle branch block. His cardiac risk factor profile is notable for 2 hypertension, hyperlipidemia and insulin-dependent diabetes.    Current Outpatient Prescriptions  Medication Sig Dispense Refill  . allopurinol (ZYLOPRIM) 300 MG tablet Take 300 mg by mouth every other day.       Marland Kitchen aspirin 81 MG tablet Take 81 mg by mouth every other day.       . ezetimibe (ZETIA) 10 MG tablet Take 10 mg by mouth daily.       . hydrochlorothiazide (HYDRODIURIL) 25 MG tablet 25 mg daily.      . insulin glargine (LANTUS) 100 UNIT/ML injection Inject 4 Units into the skin at bedtime.       Marland Kitchen LOSARTAN POTASSIUM PO Take 100 mg by mouth daily.       . metFORMIN (GLUCOPHAGE) 1000 MG tablet Take 1,000 mg by mouth 2 (two) times daily with a meal.       . minocycline (MINOCIN,DYNACIN) 100 MG capsule 100 mg every other day.      Marland Kitchen NOVOLOG FLEXPEN 100 UNIT/ML SOPN FlexPen 24 Units every morning.      . rosuvastatin (CRESTOR) 40 MG tablet Take 40 mg by mouth every evening.       . sitaGLIPtin (JANUVIA) 100 MG tablet Take 100 mg by mouth. 1/2 tablet daily       No current facility-administered medications for this visit.    No Known Allergies  History   Social History  . Marital Status: Divorced    Spouse Name: N/A    Number of Children: N/A  . Years of Education: N/A   Occupational History  . Not on file.   Social History Main Topics  . Smoking status: Never Smoker   . Smokeless tobacco: Never Used  . Alcohol Use: No   . Drug Use: No  . Sexually Active: Not on file   Other Topics Concern  . Not on file   Social History Narrative  . No narrative on file     Review of Systems: General: negative for chills, fever, night sweats or weight changes.  Cardiovascular: negative for chest pain, dyspnea on exertion, edema, orthopnea, palpitations, paroxysmal nocturnal dyspnea or shortness of breath Dermatological: negative for rash Respiratory: negative for cough or wheezing Urologic: negative for hematuria Abdominal: negative for nausea, vomiting, diarrhea, bright red blood per rectum, melena, or hematemesis Neurologic: negative for visual changes, syncope, or dizziness All other systems reviewed and are otherwise negative except as noted above.    Blood pressure 110/70, pulse 55, height 5\' 10"  (1.778 m), weight 201 lb (91.173 kg).  General appearance: alert and no distress Neck: no adenopathy, no carotid bruit, no JVD, supple, symmetrical, trachea midline and thyroid not enlarged, symmetric, no tenderness/mass/nodules Lungs: clear to auscultation bilaterally Heart: regular rate and rhythm, S1, S2 normal, no murmur, click, rub or gallop Abdomen: soft, non-tender; bowel sounds normal; no masses,  no organomegaly Extremities: extremities normal, atraumatic, no cyanosis or edema Pulses: 2+ and symmetric  EKG sinus bradycardia at 55 with right bundle-branch block and  left axis deviation  ASSESSMENT AND PLAN:   Right bundle branch block The patient was a rare referred for abnormal EKG notable for right bundle branch block. He has complained of some nocturnal chest pain with cardiac risk factors notable for hypertension, hyperlipidemia and diabetes. Based on this, his age and his EKG findings and then to obtain [place] to rule out an ischemic etiology.  Essential hypertension Under good control of her medications  Hyperlipidemia On statin therapy followed by his premature  physician      Lorretta Harp MD [facility], [facility] 03/23/2013 9:50 AM

## 2013-03-23 NOTE — Assessment & Plan Note (Signed)
The patient was a rare referred for abnormal EKG notable for right bundle branch block. He has complained of some nocturnal chest pain with cardiac risk factors notable for hypertension, hyperlipidemia and diabetes. Based on this, his age and his EKG findings and then to obtain [place] to rule out an ischemic etiology.

## 2013-03-29 ENCOUNTER — Ambulatory Visit (HOSPITAL_COMMUNITY)
Admission: RE | Admit: 2013-03-29 | Discharge: 2013-03-29 | Disposition: A | Discharge status: Home / Self Care | Payer: Medicare Other | Source: Ambulatory Visit | Attending: Cardiovascular Disease | Admitting: Cardiovascular Disease

## 2013-03-29 DIAGNOSIS — R079 Chest pain, unspecified: Secondary | ICD-10-CM | POA: Insufficient documentation

## 2013-03-29 DIAGNOSIS — E109 Type 1 diabetes mellitus without complications: Secondary | ICD-10-CM | POA: Insufficient documentation

## 2013-03-29 DIAGNOSIS — I4510 Unspecified right bundle-branch block: Secondary | ICD-10-CM | POA: Insufficient documentation

## 2013-03-29 DIAGNOSIS — Z794 Long term (current) use of insulin: Secondary | ICD-10-CM | POA: Insufficient documentation

## 2013-03-29 DIAGNOSIS — I10 Essential (primary) hypertension: Secondary | ICD-10-CM | POA: Insufficient documentation

## 2013-03-29 DIAGNOSIS — R0789 Other chest pain: Secondary | ICD-10-CM

## 2013-03-29 DIAGNOSIS — E663 Overweight: Secondary | ICD-10-CM | POA: Insufficient documentation

## 2013-03-29 DIAGNOSIS — R42 Dizziness and giddiness: Secondary | ICD-10-CM | POA: Insufficient documentation

## 2013-03-29 MED ORDER — TECHNETIUM TC 99M SESTAMIBI GENERIC - CARDIOLITE
30.4000 | Freq: Once | INTRAVENOUS | Status: AC | PRN
  Administered 2013-03-29: 30 via INTRAVENOUS

## 2013-03-29 MED ORDER — TECHNETIUM TC 99M SESTAMIBI GENERIC - CARDIOLITE
10.5000 | Freq: Once | INTRAVENOUS | Status: AC | PRN
  Administered 2013-03-29: 11 via INTRAVENOUS

## 2013-03-29 MED ORDER — REGADENOSON 0.4 MG/5ML IV SOLN
0.4000 mg | Freq: Once | INTRAVENOUS | Status: AC
  Administered 2013-03-29: 0.4 mg via INTRAVENOUS

## 2013-03-29 NOTE — Procedures (Addendum)
[facility] [address] [place] [address] [id number]  Cardiology Nuclear Med Study  Keith Drake is a 78 y.o. male     MRN : [id number]     DOB: 1935-07-26  Procedure Date: 03/29/2013  Nuclear Med Background Indication for Stress Test:  Evaluation for Ischemia and Abnormal EKG History:  NO PRIOR HISTORY Cardiac Risk Factors: Hypertension, IDDM Type 1, Lipids, Overweight and RBBB  Symptoms:  Chest Pain, Dizziness and Light-Headedness   Nuclear Pre-Procedure Caffeine/Decaff Intake:  7:00pm NPO After: 5:00am   IV Site: R Hand  IV 0.9% NS with Angio Cath:  22g  Chest Size (in):  44"  IV Started by: Azucena Cecil, RN  Height: 5\' 10"  (1.778 m)  Cup Size: n/a  BMI:  Body mass index is 28.84 kg/(m^2). Weight:  201 lb (91.173 kg)   Tech Comments:  N/A    Nuclear Med Study 1 or 2 day study: 1 day  Stress Test Type:  [place]  Order Authorizing Provider:  Quay Burow, MD   Resting Radionuclide: Technetium 41m Sestamibi  Resting Radionuclide Dose: 10.5 mCi   Stress Radionuclide:  Technetium 43m Sestamibi  Stress Radionuclide Dose: 30.4 mCi           Stress Protocol Rest HR: 49 Stress HR: 65  Rest BP: 142/81 Stress BP: 133/83  Exercise Time (min): n/a METS: n/a   Predicted Max HR: 144 bpm % Max HR: 45.14 bpm Rate Pressure Product: 9230  Dose of Adenosine (mg):  n/a Dose of Lexiscan: 0.4 mg  Dose of Atropine (mg): n/a Dose of Dobutamine: n/a mcg/kg/min (at max HR)  Stress Test Technologist: Leane Para, CCT Nuclear Technologist: Imagene Riches, CNMT   Rest Procedure:  Myocardial perfusion imaging was performed at rest 45 minutes following the intravenous administration of Technetium 27m Sestamibi. Stress Procedure:  The patient received IV Lexiscan 0.4 mg over 15-seconds.  Technetium 82m Sestamibi injected at 30-seconds.  There were no significant changes with Lexiscan.  Quantitative spect images were obtained  after a 45 minute delay.  Transient Ischemic Dilatation (Normal <1.22):  1.23 Lung/Heart Ratio (Normal <0.45):  0.27 QGS EDV:  n/a ml QGS ESV:  n/a ml LV Ejection Fraction: Study not gated     Rest ECG: NSR-RBBB and PACs  Stress ECG: No significant change from baseline ECG  QPS Raw Data Images:  Normal; no motion artifact; normal heart/lung ratio. Stress Images:  Normal homogeneous uptake in all areas of the myocardium. Rest Images:  Normal homogeneous uptake in all areas of the myocardium. Subtraction (SDS):  Normal  Impression Exercise Capacity:  Lexiscan with no exercise. BP Response:  Normal blood pressure response. Clinical Symptoms:  No significant symptoms noted. ECG Impression:  No significant ST segment change suggestive of ischemia. Comparison with Prior Nuclear Study: No previous nuclear study performed  Overall Impression:  Normal stress nuclear study.  LV Wall Motion:  Could not be gated due to ectopy   Keith Mcmichael, MD  03/29/2013 1:49 PM

## 2013-04-03 ENCOUNTER — Encounter: Payer: Self-pay | Admitting: *Deleted

## 2013-04-18 ENCOUNTER — Encounter: Payer: Self-pay | Admitting: Cardiovascular Disease

## 2014-02-01 ENCOUNTER — Encounter: Payer: Self-pay | Admitting: Gastroenterology

## 2015-02-15 ENCOUNTER — Encounter: Payer: Self-pay | Admitting: Gastroenterology

## 2015-10-30 ENCOUNTER — Encounter: Payer: Self-pay | Admitting: Gastroenterology

## 2015-11-11 ENCOUNTER — Encounter: Payer: Self-pay | Admitting: Gastroenterology

## 2016-01-16 ENCOUNTER — Ambulatory Visit (AMBULATORY_SURGERY_CENTER): Payer: Self-pay

## 2016-01-16 VITALS — Ht 70.0 in | Wt 203.0 lb

## 2016-01-16 DIAGNOSIS — Z1211 Encounter for screening for malignant neoplasm of colon: Secondary | ICD-10-CM

## 2016-01-16 MED ORDER — NA SULFATE-K SULFATE-MG SULF 17.5-3.13-1.6 GM/177ML PO SOLN: 1.0000 | Freq: Once | ORAL | Status: DC

## 2016-01-16 NOTE — Progress Notes (Signed)
No egg or soy allergies Not on home 02 No previous anesthesia complications No diet or weight loss meds 

## 2016-01-16 NOTE — Addendum Note (Signed)
Addended by: Kasandra Knudsen on: 01/16/2016 03:32 PM   Modules accepted: Orders

## 2016-01-21 ENCOUNTER — Ambulatory Visit: Payer: Medicare Other | Admitting: Gastroenterology

## 2016-01-21 VITALS — Temp 97.3°F

## 2016-01-21 DIAGNOSIS — Z1211 Encounter for screening for malignant neoplasm of colon: Secondary | ICD-10-CM

## 2016-01-21 MED ORDER — NA SULFATE-K SULFATE-MG SULF 17.5-3.13-1.6 GM/177ML PO SOLN: 1.0000 | Freq: Once | ORAL | Status: DC

## 2016-01-21 MED ORDER — SODIUM CHLORIDE 0.9 % IV SOLN: 500.0000 mL | INTRAVENOUS | Status: DC

## 2016-01-21 NOTE — Progress Notes (Signed)
Went over 2 day prep instructions with patient and carepartner. Advised clear liquids only until after the procedure on 01-23-16. Patient and care partner verbalized understanding and agreed to follow instructions. Advised patient/care partner to purchase miralax, dulcolax, and gatorade for 2 day prep. Patient was also given a sample of Suprep. Keith Drake-PV

## 2016-01-21 NOTE — Progress Notes (Signed)
Pt.stated that he is still having solid Alechia Lezama stool after taken both parts of prep,made doctor Fuller Plan aware and he stated that patient need to be rescheduled with a 2 day prep. Explained  To pt. That doctor would not be able to see colon if he is not cleaned out,pt. Verbalize understanding.pt. Scheduled for Thursday 01/23/16 @8 :30 and we are doing previst today with 2 day prep. Pt. Taken to consultation room to give instructions.

## 2016-01-23 ENCOUNTER — Ambulatory Visit (AMBULATORY_SURGERY_CENTER): Payer: Medicare Other | Admitting: Gastroenterology

## 2016-01-23 ENCOUNTER — Encounter: Payer: Self-pay | Admitting: Gastroenterology

## 2016-01-23 VITALS — BP 108/65 | HR 56 | Temp 97.7°F | Resp 16 | Ht 70.0 in | Wt 203.0 lb

## 2016-01-23 DIAGNOSIS — D124 Benign neoplasm of descending colon: Secondary | ICD-10-CM | POA: Diagnosis not present

## 2016-01-23 DIAGNOSIS — Z1211 Encounter for screening for malignant neoplasm of colon: Secondary | ICD-10-CM | POA: Diagnosis present

## 2016-01-23 DIAGNOSIS — D122 Benign neoplasm of ascending colon: Secondary | ICD-10-CM

## 2016-01-23 MED ORDER — SODIUM CHLORIDE 0.9 % IV SOLN: 500.0000 mL | INTRAVENOUS | Status: DC

## 2016-01-23 NOTE — Op Note (Signed)
[name]: Keith Drake Procedure Date: 01/23/2016 8:35 AM MRN: [id number] Endoscopist: Ladene Artist , MD Age: 81 Referring MD:  Date of Birth: [birth year] Gender: Male Procedure:                Colonoscopy Indications:              Screening for colorectal malignant neoplasm Medicines:                Monitored Anesthesia Care Procedure:                Pre-Anesthesia Assessment:                           - Prior to the procedure, a History and Physical                            was performed, and patient medications and                            allergies were reviewed. The patient's tolerance of                            previous anesthesia was also reviewed. The risks                            and benefits of the procedure and the sedation                            options and risks were discussed with the patient.                            All questions were answered, and informed consent                            was obtained. Prior Anticoagulants: The patient has                            taken no previous anticoagulant or antiplatelet                            agents. ASA Grade Assessment: III - A patient with                            severe systemic disease. After reviewing the risks                            and benefits, the patient was deemed in                            satisfactory condition to undergo the procedure.                           After obtaining informed consent, the colonoscope  was passed under direct vision. Throughout the                            procedure, the patient's blood pressure, pulse, and                            oxygen saturations were monitored continuously. The                            Model PCF-H190L [id number]) scope was introduced                            through the anus and advanced to the the cecum,                            identified by appendiceal orifice and  ileocecal                            valve. The colonoscopy was performed without                            difficulty. The patient tolerated the procedure                            well. The quality of the bowel preparation was                            good. The ileocecal valve, appendiceal orifice, and                            rectum were photographed. Scope In: 8:42:50 AM Scope Out: 8:59:03 AM Scope Withdrawal Time: 0 hours 13 minutes 22 seconds  Total Procedure Duration: 0 hours 16 minutes 13 seconds  Findings:      The digital rectal exam was normal.      Two sessile polyps were found in the descending colon and ascending       colon. The polyps were 7 to 8 mm in size. These polyps were removed with       a cold snare. Resection and retrieval were complete.      The exam was otherwise normal throughout the examined colon.      Internal hemorrhoids were found during retroflexion. The hemorrhoids       were moderate and Grade I (internal hemorrhoids that do not prolapse).      No additional abnormalities were found on retroflexion. Complications:            No immediate complications. Estimated Blood Loss:     Estimated blood loss: none. Impression:               - Two 7 to 8 mm polyps in the descending colon and                            in the ascending colon, removed with a cold snare.  Resected and retrieved.                           - Internal hemorrhoids. Recommendation:           - Patient has a contact number available for                            emergencies. The signs and symptoms of potential                            delayed complications were discussed with the                            patient. Return to normal activities tomorrow.                            Written discharge instructions were provided to the                            patient.                           - Resume previous diet.                           - Continue  present medications.                           - Await pathology results.                           - No repeat colonoscopy due to age. Procedure Code(s):        --- Professional ---                           [phone], Colonoscopy, flexible; with removal of                            tumor(s), polyp(s), or other lesion(s) by snare                            technique CPT copyright 2016 American Medical Association. All rights reserved. Ladene Artist, MD 01/23/2016 9:05:51 AM This report has been signed electronically. Number of Addenda: 0 Referring MD:      Crist Infante, MD

## 2016-01-23 NOTE — Progress Notes (Signed)
A/ox3 pleased with MAC, report to Suzanne RN 

## 2016-01-23 NOTE — Progress Notes (Signed)
Discharge instructions given by Riki Sheer, LPN

## 2016-01-23 NOTE — Progress Notes (Signed)
Called to room to assist during endoscopic procedure.  Patient ID and intended procedure confirmed with present staff. Received instructions for my participation in the procedure from the performing physician.  

## 2016-01-23 NOTE — Patient Instructions (Signed)
YOU HAD AN ENDOSCOPIC PROCEDURE TODAY AT THE [facility]:   Refer to the procedure report that was given to you for any specific questions about what was found during the examination.  If the procedure report does not answer your questions, please call your gastroenterologist to clarify.  If you requested that your care partner not be given the details of your procedure findings, then the procedure report has been included in a sealed envelope for you to review at your convenience later.  YOU SHOULD EXPECT: Some feelings of bloating in the abdomen. Passage of more gas than usual.  Walking can help get rid of the air that was put into your GI tract during the procedure and reduce the bloating. If you had a lower endoscopy (such as a colonoscopy or flexible sigmoidoscopy) you may notice spotting of blood in your stool or on the toilet paper. If you underwent a bowel prep for your procedure, you may not have a normal bowel movement for a few days.  Please Note:  You might notice some irritation and congestion in your nose or some drainage.  This is from the oxygen used during your procedure.  There is no need for concern and it should clear up in a day or so.  SYMPTOMS TO REPORT IMMEDIATELY:   Following lower endoscopy (colonoscopy or flexible sigmoidoscopy):  Excessive amounts of blood in the stool  Significant tenderness or worsening of abdominal pains  Swelling of the abdomen that is new, acute  Fever of 100F or higher   For urgent or emergent issues, a gastroenterologist can be reached at any hour by calling [phone].   DIET: Your first meal following the procedure should be a small meal and then it is ok to progress to your normal diet. Heavy or fried foods are harder to digest and may make you feel nauseous or bloated.  Likewise, meals heavy in dairy and vegetables can increase bloating.  Drink plenty of fluids but you should avoid alcoholic beverages for 24  hours.  ACTIVITY:  You should plan to take it easy for the rest of today and you should NOT DRIVE or use heavy machinery until tomorrow (because of the sedation medicines used during the test).    FOLLOW UP: Our staff will call the number listed on your records the next business day following your procedure to check on you and address any questions or concerns that you may have regarding the information given to you following your procedure. If we do not reach you, we will leave a message.  However, if you are feeling well and you are not experiencing any problems, there is no need to return our call.  We will assume that you have returned to your regular daily activities without incident.  If any biopsies were taken you will be contacted by phone or by letter within the next 1-3 weeks.  Please call us at [phone] if you have not heard about the biopsies in 3 weeks.    SIGNATURES/CONFIDENTIALITY: You and/or your care partner have signed paperwork which will be entered into your electronic medical record.  These signatures attest to the fact that that the information above on your After Visit Summary has been reviewed and is understood.  Full responsibility of the confidentiality of this discharge information lies with you and/or your care-partner.  Read all of the handouts given to you by your recovery room nurse. 

## 2016-01-24 ENCOUNTER — Telehealth: Payer: Self-pay | Admitting: *Deleted

## 2016-01-24 NOTE — Telephone Encounter (Signed)
  Follow up Call-  Call back number 01/23/2016 01/21/2016  Post procedure Call Back phone  # [phone]  Permission to leave phone message Yes Yes     Patient questions:  Do you have a fever, pain , or abdominal swelling? No. Pain Score  0 *  Have you tolerated food without any problems? Yes.    Have you been able to return to your normal activities? Yes.    Do you have any questions about your discharge instructions: Diet   No. Medications  No. Follow up visit  No.  Do you have questions or concerns about your Care? No.  Actions: * If pain score is 4 or above: No action needed, pain <4.

## 2016-01-31 ENCOUNTER — Encounter: Payer: Self-pay | Admitting: Gastroenterology

## 2017-07-05 ENCOUNTER — Ambulatory Visit (INDEPENDENT_AMBULATORY_CARE_PROVIDER_SITE_OTHER): Payer: Medicare Other | Admitting: Podiatry

## 2017-07-05 ENCOUNTER — Encounter: Payer: Self-pay | Admitting: Podiatry

## 2017-07-05 VITALS — BP 141/69 | HR 54

## 2017-07-05 DIAGNOSIS — B351 Tinea unguium: Secondary | ICD-10-CM

## 2017-07-05 DIAGNOSIS — E0842 Diabetes mellitus due to underlying condition with diabetic polyneuropathy: Secondary | ICD-10-CM

## 2017-07-05 DIAGNOSIS — M79676 Pain in unspecified toe(s): Secondary | ICD-10-CM | POA: Diagnosis not present

## 2017-07-05 NOTE — Progress Notes (Signed)
   Subjective:    Patient ID: Keith Drake, male    DOB: 08/25/1935, 82 y.o.   MRN: [id number]  HPI  Chief Complaint  Patient presents with  . Diabetic foot care       Review of Systems  All other systems reviewed and are negative.      Objective:   Physical Exam        Assessment & Plan:

## 2017-07-05 NOTE — Progress Notes (Signed)
Patient ID: Keith Drake, male   DOB: 06-Apr-1935, 82 y.o.   MRN: [id number]   SUBJECTIVE Patient with a history of diabetes mellitus presents to office today complaining of elongated, thickened nails. Pain while ambulating in shoes. Patient is unable to trim their own nails.   Past Medical History:  Diagnosis Date  . Anemia   . Arthritis    OCC LEG PAIN  . Chronic kidney disease    stage lll  . Diabetes mellitus    ORAL AND INSULIN MEDS  . History of bladder carcinoma TCC   S/P BCG TX'S   . History of gout CURRENTLY STABLE  . Hyperlipidemia   . Hypertension   . Impaired hearing BILATERAL    OCCASIONAL WEARS HEARING AIDS  . Right bundle branch block     OBJECTIVE General Patient is awake, alert, and oriented x 3 and in no acute distress. Derm Skin is dry and supple bilateral. Negative open lesions or macerations. Remaining integument unremarkable. Nails are tender, long, thickened and dystrophic with subungual debris, consistent with onychomycosis, 1-5 bilateral. No signs of infection noted. Vasc  DP and PT pedal pulses palpable bilaterally. Temperature gradient within normal limits.  Neuro Epicritic and protective threshold sensation diminished bilaterally.  Musculoskeletal Exam No symptomatic pedal deformities noted bilateral. Muscular strength within normal limits.  ASSESSMENT 1. Diabetes Mellitus w/ peripheral neuropathy 2. Onychomycosis of nail due to dermatophyte bilateral 3. Pain in foot bilateral  PLAN OF CARE 1. Patient evaluated today. 2. Instructed to maintain good pedal hygiene and foot care. Stressed importance of controlling blood sugar.  3. Mechanical debridement of nails 1-5 bilaterally performed using a nail nipper. Filed with dremel without incident.  4. Return to clinic in 3 mos.     Edrick Kins, DPM [facility]  Dr. Edrick Kins, [place]                                        [place], [place] [address]                 Office [phone]  Fax [phone]

## 2017-08-31 ENCOUNTER — Other Ambulatory Visit: Payer: Self-pay | Admitting: Nephrology

## 2017-08-31 DIAGNOSIS — N184 Chronic kidney disease, stage 4 (severe): Secondary | ICD-10-CM

## 2017-09-21 ENCOUNTER — Other Ambulatory Visit: Payer: Medicare Other

## 2017-09-24 ENCOUNTER — Ambulatory Visit
Admission: RE | Admit: 2017-09-24 | Discharge: 2017-09-24 | Disposition: A | Discharge status: Home / Self Care | Payer: Medicare Other | Source: Ambulatory Visit | Attending: Nephrology | Admitting: Nephrology

## 2017-09-24 DIAGNOSIS — N184 Chronic kidney disease, stage 4 (severe): Secondary | ICD-10-CM

## 2017-10-04 ENCOUNTER — Ambulatory Visit: Payer: Medicare Other | Admitting: Podiatry

## 2018-04-21 ENCOUNTER — Other Ambulatory Visit: Payer: Self-pay | Admitting: Family Medicine

## 2018-04-21 DIAGNOSIS — N184 Chronic kidney disease, stage 4 (severe): Secondary | ICD-10-CM

## 2018-04-21 DIAGNOSIS — R29898 Other symptoms and signs involving the musculoskeletal system: Secondary | ICD-10-CM

## 2018-04-21 DIAGNOSIS — C679 Malignant neoplasm of bladder, unspecified: Secondary | ICD-10-CM

## 2018-05-02 ENCOUNTER — Ambulatory Visit
Admission: RE | Admit: 2018-05-02 | Discharge: 2018-05-02 | Disposition: A | Discharge status: Home / Self Care | Payer: Medicare Other | Source: Ambulatory Visit | Attending: Family Medicine | Admitting: Family Medicine

## 2018-05-02 DIAGNOSIS — C679 Malignant neoplasm of bladder, unspecified: Secondary | ICD-10-CM

## 2018-05-02 DIAGNOSIS — N184 Chronic kidney disease, stage 4 (severe): Secondary | ICD-10-CM

## 2018-05-02 DIAGNOSIS — R29898 Other symptoms and signs involving the musculoskeletal system: Secondary | ICD-10-CM

## 2018-06-21 ENCOUNTER — Ambulatory Visit (HOSPITAL_COMMUNITY): Admission: RE | Admit: 2018-06-21 | Payer: Medicare Other | Source: Ambulatory Visit

## 2018-06-21 ENCOUNTER — Other Ambulatory Visit (HOSPITAL_COMMUNITY): Payer: Self-pay | Admitting: Nephrology

## 2018-06-21 DIAGNOSIS — I10 Essential (primary) hypertension: Secondary | ICD-10-CM

## 2018-07-06 ENCOUNTER — Ambulatory Visit (HOSPITAL_COMMUNITY)
Admission: RE | Admit: 2018-07-06 | Discharge: 2018-07-06 | Disposition: A | Discharge status: Home / Self Care | Payer: Medicare Other | Source: Ambulatory Visit | Attending: Nephrology | Admitting: Nephrology

## 2018-07-06 DIAGNOSIS — I10 Essential (primary) hypertension: Secondary | ICD-10-CM

## 2018-07-06 DIAGNOSIS — I129 Hypertensive chronic kidney disease with stage 1 through stage 4 chronic kidney disease, or unspecified chronic kidney disease: Secondary | ICD-10-CM | POA: Insufficient documentation

## 2018-07-06 DIAGNOSIS — M109 Gout, unspecified: Secondary | ICD-10-CM | POA: Diagnosis not present

## 2018-07-06 DIAGNOSIS — D631 Anemia in chronic kidney disease: Secondary | ICD-10-CM | POA: Insufficient documentation

## 2018-07-06 DIAGNOSIS — N281 Cyst of kidney, acquired: Secondary | ICD-10-CM | POA: Insufficient documentation

## 2018-07-06 DIAGNOSIS — I774 Celiac artery compression syndrome: Secondary | ICD-10-CM | POA: Insufficient documentation

## 2018-07-06 DIAGNOSIS — C679 Malignant neoplasm of bladder, unspecified: Secondary | ICD-10-CM | POA: Insufficient documentation

## 2018-07-06 DIAGNOSIS — N183 Chronic kidney disease, stage 3 (moderate): Secondary | ICD-10-CM | POA: Insufficient documentation

## 2018-07-06 NOTE — Progress Notes (Signed)
Renal artery duplex completed:  No evidence of renal artery stenosis.   Keith Drake 07/06/2018 10:41 AM

## 2018-07-12 ENCOUNTER — Other Ambulatory Visit: Payer: Self-pay | Admitting: Neurosurgery

## 2018-07-19 ENCOUNTER — Encounter (HOSPITAL_COMMUNITY): Payer: Self-pay

## 2018-07-19 NOTE — Pre-Procedure Instructions (Signed)
Keith Drake  07/19/2018      [id number] - [place], [place] [address] [place] [address] Phone: [phone] Fax: [phone]    Your procedure is scheduled on Monday September 30.  Report to [facility] at 5:30 A.M.  Call this number if you have problems the morning of surgery:  [phone]   Remember:  Do not eat or drink after midnight.    Take these medicines the morning of surgery with A SIP OF WATER:   Allopurinol (Zyloprim) Amlodipine (Norvasc) Minocycline (Minocin) Acetaminophen (Tylenol) if needed  TAKE 70% of Humolog 70/30 the day before surgery. (Morning- 28 units, Evening- 10 units)  DO NOT TAKE Humolog 70/30 the day of surgery  7 days prior to surgery STOP taking any Aleve, Naproxen, Ibuprofen, Motrin, Advil, Goody's, BC's, all herbal medications, fish oil, and all vitamins  FOLLOW your surgeon's instructions on stopping Aspirin. If no instructions were given, please call your surgeon's office.       How to Manage Your Diabetes Before and After Surgery  Why is it important to control my blood sugar before and after surgery? . Improving blood sugar levels before and after surgery helps healing and can limit problems. . A way of improving blood sugar control is eating a healthy diet by: o  Eating less sugar and carbohydrates o  Increasing activity/exercise o  Talking with your doctor about reaching your blood sugar goals . High blood sugars (greater than 180 mg/dL) can raise your risk of infections and slow your recovery, so you will need to focus on controlling your diabetes during the weeks before surgery. . Make sure that the doctor who takes care of your diabetes knows about your planned surgery including the date and location.  How do I manage my blood sugar before surgery? . Check your blood sugar at least 4 times a day, starting 2 days before  surgery, to make sure that the level is not too high or low. o Check your blood sugar the morning of your surgery when you wake up and every 2 hours until you get to the Short Stay unit. . If your blood sugar is less than 70 mg/dL, you will need to treat for low blood sugar: o Do not take insulin. o Treat a low blood sugar (less than 70 mg/dL) with  cup of clear juice (cranberry or apple), 4 glucose tablets, OR glucose gel. Recheck blood sugar in 15 minutes after treatment (to make sure it is greater than 70 mg/dL). If your blood sugar is not greater than 70 mg/dL on recheck, call [phone] o  for further instructions. . Report your blood sugar to the short stay nurse when you get to Short Stay.  . If you are admitted to the hospital after surgery: o Your blood sugar will be checked by the staff and you will probably be given insulin after surgery (instead of oral diabetes medicines) to make sure you have good blood sugar levels. o The goal for blood sugar control after surgery is 80-180 mg/dL.              Do not wear jewelry, make-up or nail polish.  Do not wear lotions, powders, or perfumes, or deodorant.  Do not shave 48 hours prior to surgery.  Men may shave face and neck.  Do not bring valuables to the hospital.  [facility]  is not responsible for any belongings or valuables.  Contacts, dentures or bridgework may not be worn into surgery.  Leave your suitcase in the car.  After surgery it may be brought to your room.  For patients admitted to the hospital, discharge time will be determined by your treatment team.  Patients discharged the day of surgery will not be allowed to drive home.   Special instructions:    [facility]- Preparing For Surgery  Before surgery, you can play an important role. Because skin is not sterile, your skin needs to be as free of germs as possible. You can reduce the number of germs on your skin by washing with CHG (chlorahexidine gluconate)  Soap before surgery.  CHG is an antiseptic cleaner which kills germs and bonds with the skin to continue killing germs even after washing.    Oral Hygiene is also important to reduce your risk of infection.  Remember - BRUSH YOUR TEETH THE MORNING OF SURGERY WITH YOUR REGULAR TOOTHPASTE  Please do not use if you have an allergy to CHG or antibacterial soaps. If your skin becomes reddened/irritated stop using the CHG.  Do not shave (including legs and underarms) for at least 48 hours prior to first CHG shower. It is OK to shave your face.  Please follow these instructions carefully.   1. Shower the NIGHT BEFORE SURGERY and the MORNING OF SURGERY with CHG.   2. If you chose to wash your hair, wash your hair first as usual with your normal shampoo.  3. After you shampoo, rinse your hair and body thoroughly to remove the shampoo.  4. Use CHG as you would any other liquid soap. You can apply CHG directly to the skin and wash gently with a scrungie or a clean washcloth.   5. Apply the CHG Soap to your body ONLY FROM THE NECK DOWN.  Do not use on open wounds or open sores. Avoid contact with your eyes, ears, mouth and genitals (private parts). Wash Face and genitals (private parts)  with your normal soap.  6. Wash thoroughly, paying special attention to the area where your surgery will be performed.  7. Thoroughly rinse your body with warm water from the neck down.  8. DO NOT shower/wash with your normal soap after using and rinsing off the CHG Soap.  9. Pat yourself dry with a CLEAN TOWEL.  10. Wear CLEAN PAJAMAS to bed the night before surgery, wear comfortable clothes the morning of surgery  11. Place CLEAN SHEETS on your bed the night of your first shower and DO NOT SLEEP WITH PETS.    Day of Surgery:  Do not apply any deodorants/lotions.  Please wear clean clothes to the hospital/surgery center.   Remember to brush your teeth WITH YOUR REGULAR TOOTHPASTE.    Please read over  the following fact sheets that you were given. Coughing and Deep Breathing, MRSA Information and Surgical Site Infection Prevention

## 2018-07-20 ENCOUNTER — Emergency Department (HOSPITAL_BASED_OUTPATIENT_CLINIC_OR_DEPARTMENT_OTHER): Payer: Medicare Other

## 2018-07-20 ENCOUNTER — Telehealth: Payer: Self-pay | Admitting: Physician Assistant

## 2018-07-20 ENCOUNTER — Encounter (HOSPITAL_COMMUNITY): Payer: Self-pay

## 2018-07-20 ENCOUNTER — Ambulatory Visit (HOSPITAL_COMMUNITY)
Admission: EM | Disposition: A | Discharge status: Home / Self Care | Payer: Self-pay | Source: Home / Self Care | Attending: Emergency Medicine

## 2018-07-20 ENCOUNTER — Encounter (HOSPITAL_COMMUNITY)
Admission: RE | Admit: 2018-07-20 | Discharge: 2018-07-20 | Disposition: A | Discharge status: Home / Self Care | Payer: Medicare Other | Source: Ambulatory Visit | Attending: Neurosurgery | Admitting: Neurosurgery

## 2018-07-20 ENCOUNTER — Encounter (HOSPITAL_COMMUNITY): Payer: Self-pay | Admitting: Physician Assistant

## 2018-07-20 ENCOUNTER — Other Ambulatory Visit: Payer: Self-pay

## 2018-07-20 ENCOUNTER — Emergency Department (HOSPITAL_COMMUNITY): Payer: Medicare Other

## 2018-07-20 ENCOUNTER — Ambulatory Visit (HOSPITAL_COMMUNITY)
Admission: EM | Admit: 2018-07-20 | Discharge: 2018-07-21 | Disposition: A | Discharge status: Home / Self Care | Payer: Medicare Other | Attending: Cardiovascular Disease | Admitting: Cardiovascular Disease

## 2018-07-20 DIAGNOSIS — I081 Rheumatic disorders of both mitral and tricuspid valves: Secondary | ICD-10-CM | POA: Insufficient documentation

## 2018-07-20 DIAGNOSIS — I441 Atrioventricular block, second degree: Secondary | ICD-10-CM | POA: Diagnosis not present

## 2018-07-20 DIAGNOSIS — Z794 Long term (current) use of insulin: Secondary | ICD-10-CM | POA: Insufficient documentation

## 2018-07-20 DIAGNOSIS — E785 Hyperlipidemia, unspecified: Secondary | ICD-10-CM | POA: Diagnosis not present

## 2018-07-20 DIAGNOSIS — Z8551 Personal history of malignant neoplasm of bladder: Secondary | ICD-10-CM | POA: Insufficient documentation

## 2018-07-20 DIAGNOSIS — M109 Gout, unspecified: Secondary | ICD-10-CM | POA: Insufficient documentation

## 2018-07-20 DIAGNOSIS — E1122 Type 2 diabetes mellitus with diabetic chronic kidney disease: Secondary | ICD-10-CM | POA: Insufficient documentation

## 2018-07-20 DIAGNOSIS — Z9889 Other specified postprocedural states: Secondary | ICD-10-CM | POA: Diagnosis not present

## 2018-07-20 DIAGNOSIS — Z800 Family history of malignant neoplasm of digestive organs: Secondary | ICD-10-CM | POA: Insufficient documentation

## 2018-07-20 DIAGNOSIS — R001 Bradycardia, unspecified: Secondary | ICD-10-CM

## 2018-07-20 DIAGNOSIS — Z01818 Encounter for other preprocedural examination: Secondary | ICD-10-CM | POA: Insufficient documentation

## 2018-07-20 DIAGNOSIS — I442 Atrioventricular block, complete: Secondary | ICD-10-CM | POA: Diagnosis not present

## 2018-07-20 DIAGNOSIS — E119 Type 2 diabetes mellitus without complications: Secondary | ICD-10-CM

## 2018-07-20 DIAGNOSIS — Z79899 Other long term (current) drug therapy: Secondary | ICD-10-CM | POA: Insufficient documentation

## 2018-07-20 DIAGNOSIS — I10 Essential (primary) hypertension: Secondary | ICD-10-CM | POA: Insufficient documentation

## 2018-07-20 DIAGNOSIS — R9431 Abnormal electrocardiogram [ECG] [EKG]: Secondary | ICD-10-CM

## 2018-07-20 DIAGNOSIS — I361 Nonrheumatic tricuspid (valve) insufficiency: Secondary | ICD-10-CM | POA: Diagnosis not present

## 2018-07-20 DIAGNOSIS — Z833 Family history of diabetes mellitus: Secondary | ICD-10-CM | POA: Diagnosis not present

## 2018-07-20 DIAGNOSIS — N183 Chronic kidney disease, stage 3 (moderate): Secondary | ICD-10-CM | POA: Insufficient documentation

## 2018-07-20 DIAGNOSIS — F17290 Nicotine dependence, other tobacco product, uncomplicated: Secondary | ICD-10-CM | POA: Diagnosis not present

## 2018-07-20 DIAGNOSIS — M1990 Unspecified osteoarthritis, unspecified site: Secondary | ICD-10-CM | POA: Diagnosis not present

## 2018-07-20 DIAGNOSIS — Z809 Family history of malignant neoplasm, unspecified: Secondary | ICD-10-CM | POA: Diagnosis not present

## 2018-07-20 DIAGNOSIS — I452 Bifascicular block: Secondary | ICD-10-CM

## 2018-07-20 DIAGNOSIS — Z7982 Long term (current) use of aspirin: Secondary | ICD-10-CM | POA: Insufficient documentation

## 2018-07-20 DIAGNOSIS — I129 Hypertensive chronic kidney disease with stage 1 through stage 4 chronic kidney disease, or unspecified chronic kidney disease: Secondary | ICD-10-CM | POA: Diagnosis not present

## 2018-07-20 DIAGNOSIS — Z950 Presence of cardiac pacemaker: Secondary | ICD-10-CM

## 2018-07-20 HISTORY — PX: PACEMAKER IMPLANT: EP1218

## 2018-07-20 HISTORY — DX: Chronic kidney disease, stage 3 (moderate): N18.3

## 2018-07-20 HISTORY — DX: Type 2 diabetes mellitus without complications: E11.9

## 2018-07-20 HISTORY — DX: Personal history of urinary calculi: Z87.442

## 2018-07-20 HISTORY — PX: INSERT / REPLACE / REMOVE PACEMAKER: SUR710

## 2018-07-20 HISTORY — DX: Chronic kidney disease, stage 3 unspecified: N18.30

## 2018-07-20 HISTORY — DX: Atrioventricular block, second degree: I44.1

## 2018-07-20 HISTORY — DX: Presence of cardiac pacemaker: Z95.0

## 2018-07-20 HISTORY — DX: Pneumonia, unspecified organism: J18.9

## 2018-07-20 HISTORY — DX: Malignant neoplasm of bladder, unspecified: C67.9

## 2018-07-20 LAB — LIPID PANEL
CHOL/HDL RATIO: 4.3 ratio
Cholesterol: 115 mg/dL (ref 0–200)
HDL: 27 mg/dL — AB (ref >40)
LDL Cholesterol: 61 mg/dL (ref 0–99)
Triglycerides: 133 mg/dL (ref <150)
VLDL: 27 mg/dL (ref 0–40)

## 2018-07-20 LAB — COMPREHENSIVE METABOLIC PANEL
ALBUMIN: 3.9 g/dL (ref 3.5–5.0)
ALT: 21 U/L (ref 0–44)
AST: 23 U/L (ref 15–41)
Alkaline Phosphatase: 61 U/L (ref 38–126)
Anion gap: 10 (ref 5–15)
BUN: 33 mg/dL — AB (ref 8–23)
CHLORIDE: 112 mmol/L — AB (ref 98–111)
CO2: 20 mmol/L — ABNORMAL LOW (ref 22–32)
Calcium: 10.5 mg/dL — ABNORMAL HIGH (ref 8.9–10.3)
Creatinine, Ser: 2.08 mg/dL — ABNORMAL HIGH (ref 0.61–1.24)
GFR calc Af Amer: 33 mL/min — ABNORMAL LOW (ref >60)
GFR calc non Af Amer: 28 mL/min — ABNORMAL LOW (ref >60)
GLUCOSE: 57 mg/dL — AB (ref 70–99)
POTASSIUM: 4.3 mmol/L (ref 3.5–5.1)
SODIUM: 142 mmol/L (ref 135–145)
Total Bilirubin: 0.6 mg/dL (ref 0.3–1.2)
Total Protein: 7.1 g/dL (ref 6.5–8.1)

## 2018-07-20 LAB — SURGICAL PCR SCREEN
MRSA, PCR: NEGATIVE
Staphylococcus aureus: NEGATIVE

## 2018-07-20 LAB — GLUCOSE, CAPILLARY
GLUCOSE-CAPILLARY: 130 mg/dL — AB (ref 70–99)
Glucose-Capillary: 103 mg/dL — ABNORMAL HIGH (ref 70–99)
Glucose-Capillary: 159 mg/dL — ABNORMAL HIGH (ref 70–99)
Glucose-Capillary: 290 mg/dL — ABNORMAL HIGH (ref 70–99)

## 2018-07-20 LAB — CBG MONITORING, ED
Glucose-Capillary: 127 mg/dL — ABNORMAL HIGH (ref 70–99)
Glucose-Capillary: 249 mg/dL — ABNORMAL HIGH (ref 70–99)
Glucose-Capillary: 61 mg/dL — ABNORMAL LOW (ref 70–99)
Glucose-Capillary: 165 mg/dL — ABNORMAL HIGH (ref 70–99)
Glucose-Capillary: 137 mg/dL — ABNORMAL HIGH (ref 70–99)
Glucose-Capillary: 52 mg/dL — ABNORMAL LOW (ref 70–99)

## 2018-07-20 LAB — CBC WITH DIFFERENTIAL/PLATELET
Abs Immature Granulocytes: 0 10*3/uL (ref 0.0–0.1)
Basophils Absolute: 0.1 10*3/uL (ref 0.0–0.1)
Basophils Relative: 1 %
Eosinophils Absolute: 0.6 10*3/uL (ref 0.0–0.7)
Eosinophils Relative: 6 %
HEMATOCRIT: 42.4 % (ref 39.0–52.0)
HEMOGLOBIN: 13.2 g/dL (ref 13.0–17.0)
IMMATURE GRANULOCYTES: 0 %
LYMPHS PCT: 40 %
Lymphs Abs: 4 10*3/uL (ref 0.7–4.0)
MCH: 27.8 pg (ref 26.0–34.0)
MCHC: 31.1 g/dL (ref 30.0–36.0)
MCV: 89.3 fL (ref 78.0–100.0)
Monocytes Absolute: 1 10*3/uL (ref 0.1–1.0)
Monocytes Relative: 10 %
NEUTROS PCT: 43 %
Neutro Abs: 4.3 10*3/uL (ref 1.7–7.7)
Platelets: 243 10*3/uL (ref 150–400)
RBC: 4.75 MIL/uL (ref 4.22–5.81)
RDW: 15.1 % (ref 11.5–15.5)
WBC: 9.9 10*3/uL (ref 4.0–10.5)

## 2018-07-20 LAB — TYPE AND SCREEN
ABO/RH(D): B POS
Antibody Screen: NEGATIVE

## 2018-07-20 LAB — APTT: APTT: 27 s (ref 24–36)

## 2018-07-20 LAB — CBC
HCT: 41.2 % (ref 39.0–52.0)
Hemoglobin: 12.9 g/dL — ABNORMAL LOW (ref 13.0–17.0)
MCH: 27.5 pg (ref 26.0–34.0)
MCHC: 31.3 g/dL (ref 30.0–36.0)
MCV: 87.8 fL (ref 78.0–100.0)
Platelets: 226 10*3/uL (ref 150–400)
RBC: 4.69 MIL/uL (ref 4.22–5.81)
RDW: 15 % (ref 11.5–15.5)
WBC: 8.8 10*3/uL (ref 4.0–10.5)

## 2018-07-20 LAB — TROPONIN I: Troponin I: <0.03 ng/mL (ref <0.03)

## 2018-07-20 LAB — BASIC METABOLIC PANEL
Anion gap: 9 (ref 5–15)
BUN: 34 mg/dL — ABNORMAL HIGH (ref 8–23)
CO2: 22 mmol/L (ref 22–32)
Calcium: 10.5 mg/dL — ABNORMAL HIGH (ref 8.9–10.3)
Chloride: 109 mmol/L (ref 98–111)
Creatinine, Ser: 2.07 mg/dL — ABNORMAL HIGH (ref 0.61–1.24)
GFR calc Af Amer: 33 mL/min — ABNORMAL LOW (ref >60)
GFR calc non Af Amer: 28 mL/min — ABNORMAL LOW (ref >60)
GLUCOSE: 81 mg/dL (ref 70–99)
POTASSIUM: 4.2 mmol/L (ref 3.5–5.1)
Sodium: 140 mmol/L (ref 135–145)

## 2018-07-20 LAB — PROTIME-INR
INR: 1.03
Prothrombin Time: 13.4 seconds (ref 11.4–15.2)

## 2018-07-20 LAB — ECHOCARDIOGRAM COMPLETE
Height: 70 in
Weight: 3136 oz

## 2018-07-20 LAB — ABO/RH: ABO/RH(D): B POS

## 2018-07-20 LAB — HEMOGLOBIN A1C
Hgb A1c MFr Bld: 9.2 % — ABNORMAL HIGH (ref 4.8–5.6)
Mean Plasma Glucose: 217.34 mg/dL

## 2018-07-20 SURGERY — PACEMAKER IMPLANT

## 2018-07-20 MED ORDER — SODIUM CHLORIDE 0.9% FLUSH
3.0000 mL | Freq: Two times a day (BID) | INTRAVENOUS | Status: DC
  Administered 2018-07-20: 22:00:00 3 mL via INTRAVENOUS

## 2018-07-20 MED ORDER — HEPARIN (PORCINE) IN NACL 1000-0.9 UT/500ML-% IV SOLN
INTRAVENOUS | Status: AC
  Filled 2018-07-20: qty 500

## 2018-07-20 MED ORDER — LIDOCAINE HCL (PF) 1 % IJ SOLN
INTRAMUSCULAR | Status: DC | PRN
  Administered 2018-07-20: 60 mL

## 2018-07-20 MED ORDER — ONDANSETRON HCL 4 MG/2ML IJ SOLN: 4.0000 mg | Freq: Four times a day (QID) | INTRAMUSCULAR | Status: DC | PRN

## 2018-07-20 MED ORDER — SODIUM CHLORIDE 0.9 % IV SOLN: INTRAVENOUS | Status: DC

## 2018-07-20 MED ORDER — ROSUVASTATIN CALCIUM 20 MG PO TABS
40.0000 mg | ORAL_TABLET | Freq: Every day | ORAL | Status: DC
  Administered 2018-07-21: 09:00:00 40 mg via ORAL
  Filled 2018-07-20: qty 2

## 2018-07-20 MED ORDER — ACETAMINOPHEN 325 MG PO TABS: 325.0000 mg | ORAL_TABLET | ORAL | Status: DC | PRN

## 2018-07-20 MED ORDER — CEFAZOLIN SODIUM-DEXTROSE 2-4 GM/100ML-% IV SOLN
2.0000 g | INTRAVENOUS | Status: AC
  Administered 2018-07-20: 2 g via INTRAVENOUS

## 2018-07-20 MED ORDER — CEFAZOLIN SODIUM-DEXTROSE 2-4 GM/100ML-% IV SOLN
INTRAVENOUS | Status: AC
  Filled 2018-07-20: qty 100

## 2018-07-20 MED ORDER — ALLOPURINOL 100 MG PO TABS
100.0000 mg | ORAL_TABLET | Freq: Every day | ORAL | Status: DC
  Administered 2018-07-21: 09:00:00 100 mg via ORAL
  Filled 2018-07-20: qty 1

## 2018-07-20 MED ORDER — CHLORHEXIDINE GLUCONATE 4 % EX LIQD: 60.0000 mL | Freq: Once | CUTANEOUS | Status: DC

## 2018-07-20 MED ORDER — SODIUM CHLORIDE 0.9 % IV SOLN
INTRAVENOUS | Status: DC
  Administered 2018-07-21: 01:00:00 via INTRAVENOUS

## 2018-07-20 MED ORDER — FENTANYL CITRATE (PF) 100 MCG/2ML IJ SOLN
INTRAMUSCULAR | Status: AC
  Filled 2018-07-20: qty 2

## 2018-07-20 MED ORDER — INSULIN ASPART PROT & ASPART (70-30 MIX) 100 UNIT/ML ~~LOC~~ SUSP
15.0000 [IU] | Freq: Two times a day (BID) | SUBCUTANEOUS | Status: DC
  Administered 2018-07-21: 09:00:00 15 [IU] via SUBCUTANEOUS
  Filled 2018-07-20: qty 10

## 2018-07-20 MED ORDER — LIDOCAINE HCL 1 % IJ SOLN
INTRAMUSCULAR | Status: AC
  Filled 2018-07-20: qty 60

## 2018-07-20 MED ORDER — DEXTROSE 50 % IV SOLN
INTRAVENOUS | Status: AC
  Filled 2018-07-20: qty 50

## 2018-07-20 MED ORDER — ACETAMINOPHEN 500 MG PO TABS: 1000.0000 mg | ORAL_TABLET | Freq: Four times a day (QID) | ORAL | Status: DC | PRN

## 2018-07-20 MED ORDER — MIDAZOLAM HCL 5 MG/5ML IJ SOLN
INTRAMUSCULAR | Status: AC
  Filled 2018-07-20: qty 5

## 2018-07-20 MED ORDER — DEXTROSE 10 % IV SOLN: INTRAVENOUS | Status: DC

## 2018-07-20 MED ORDER — SODIUM CHLORIDE 0.9 % IV SOLN: 250.0000 mL | INTRAVENOUS | Status: DC | PRN

## 2018-07-20 MED ORDER — SODIUM CHLORIDE 0.9 % IV SOLN
INTRAVENOUS | Status: AC
  Filled 2018-07-20: qty 2

## 2018-07-20 MED ORDER — MINOCYCLINE HCL 100 MG PO CAPS
100.0000 mg | ORAL_CAPSULE | Freq: Every day | ORAL | Status: DC
  Administered 2018-07-21: 100 mg via ORAL
  Filled 2018-07-20: qty 1

## 2018-07-20 MED ORDER — HEPARIN (PORCINE) IN NACL 1000-0.9 UT/500ML-% IV SOLN
INTRAVENOUS | Status: DC | PRN
  Administered 2018-07-20: 500 mL

## 2018-07-20 MED ORDER — YOU HAVE A PACEMAKER BOOK
Freq: Once | Status: AC
  Administered 2018-07-20: 22:00:00
  Filled 2018-07-20: qty 1

## 2018-07-20 MED ORDER — CEFAZOLIN SODIUM-DEXTROSE 1-4 GM/50ML-% IV SOLN
1.0000 g | Freq: Three times a day (TID) | INTRAVENOUS | Status: AC
  Administered 2018-07-21 (×2): 1 g via INTRAVENOUS
  Filled 2018-07-20 (×2): qty 50

## 2018-07-20 MED ORDER — ASPIRIN 81 MG PO CHEW
81.0000 mg | CHEWABLE_TABLET | Freq: Every day | ORAL | Status: DC
  Filled 2018-07-20 (×2): qty 1

## 2018-07-20 MED ORDER — SODIUM CHLORIDE 0.9% FLUSH: 3.0000 mL | INTRAVENOUS | Status: DC | PRN

## 2018-07-20 MED ORDER — HYDROCHLOROTHIAZIDE 25 MG PO TABS
25.0000 mg | ORAL_TABLET | Freq: Every day | ORAL | Status: DC
  Administered 2018-07-21: 25 mg via ORAL
  Filled 2018-07-20: qty 1

## 2018-07-20 MED ORDER — SODIUM CHLORIDE 0.9 % IV SOLN
80.0000 mg | INTRAVENOUS | Status: AC
  Administered 2018-07-20: 80 mg

## 2018-07-20 MED ORDER — DEXTROSE 50 % IV SOLN: 50.0000 mL | Freq: Once | INTRAVENOUS | Status: DC

## 2018-07-20 MED ORDER — DEXTROSE 50 % IV SOLN
50.0000 mL | Freq: Once | INTRAVENOUS | Status: AC
  Administered 2018-07-20: 50 mL via INTRAVENOUS

## 2018-07-20 MED ORDER — INSULIN ASPART PROT & ASPART (70-30 MIX) 100 UNIT/ML ~~LOC~~ SUSP: 40.0000 [IU] | Freq: Every day | SUBCUTANEOUS | Status: DC

## 2018-07-20 MED ORDER — AMLODIPINE BESYLATE 10 MG PO TABS
10.0000 mg | ORAL_TABLET | Freq: Every day | ORAL | Status: DC
  Administered 2018-07-21: 10 mg via ORAL
  Filled 2018-07-20: qty 1

## 2018-07-20 SURGICAL SUPPLY — 8 items
CABLE SURGICAL S-101-97-12 (CABLE) ×3 IMPLANT
LEAD TENDRIL MRI 52CM LPA1200M (Lead) ×2 IMPLANT
LEAD TENDRIL MRI 58CM LPA1200M (Lead) ×2 IMPLANT
PACEMAKER ASSURITY DR-RF (Pacemaker) ×2 IMPLANT
PAD PRO RADIOLUCENT 2001M-C (PAD) ×3 IMPLANT
SHEATH CLASSIC 8F (SHEATH) ×4 IMPLANT
SHEATH PROBE COVER 6X72 (BAG) ×2 IMPLANT
TRAY PACEMAKER INSERTION (PACKS) ×3 IMPLANT

## 2018-07-20 NOTE — Consult Note (Addendum)
Cardiology Admission History and Physical:   Patient ID: Keith Drake MRN: [id number]; DOB: January 26, 1935   Admission date: 07/20/2018  Primary Care Provider: Crist Infante, MD Primary Cardiologist: Dr. Gwenlyn Found (last 2014) Primary Electrophysiologist:  None   Chief Complaint:  bradycardia  Patient Profile:   Keith Drake is a 83 y.o. male with PMHx of gout, DM, HTN, HLD, in 2014 was referred to Dr. Gwenlyn Found for RBBB with some symptoms back then had stress test that was negative for ischemia (not a gated study).  History of Present Illness:   Keith Drake was seen today in the pre-op testing/RN for pre-op eval/question for a planned back surgery 07/25/18.  While there he was incidentally found to have CHB on his EKG, asymptomatic, and referred to the ER.  He feels well.  States he came for his pre-op testing for surgery planned next week on his back, and the next thing he knew everyone got crazy.  He denies any kind of CP, palpitations or cardiac awareness.  No near syncope or syncope.,  He is a semi-retired brick layer, and up until about a month ago, had excellent exertional capacity, though of late, when doing physical work he "gives out", this is sudden, but no syncope.  He states he needs to take a 15 minute break to work 10 minutes, and is highly unusual for him.  He reports years of occasional unsteady episodes that he has attributed to low BS.  LABS K+ 4.2 BUN/Creat 34/2.07 WBC 8.8 H/H 13/42 Plts 226   Past Medical History:  Diagnosis Date  . Anemia   . Arthritis    OCC LEG PAIN  . Chronic kidney disease    stage lll  . Diabetes mellitus    ORAL AND INSULIN MEDS  . History of bladder carcinoma TCC   S/P BCG TX'S   . History of gout CURRENTLY STABLE  . Hyperlipidemia   . Hypertension   . Impaired hearing BILATERAL    OCCASIONAL WEARS HEARING AIDS  . Right bundle branch block     Past Surgical History:  Procedure Laterality Date  . COLONOSCOPY  03/2004  . CYSTO /  RESECTIONAL BLADDER BX'S  05-01-2011  . CYSTOSCOPY WITH BIOPSY  10/05/2011   Procedure: CYSTOSCOPY WITH BIOPSY;  Surgeon: Claybon Jabs, MD;  Location: [facility];  Service: Urology;  Laterality: N/A;  BLADDER BIOPSY   . TRANSURETHRAL RESECTION OF BLADDER TUMOR  03-02-2011  . US ECHOCARDIOGRAPHY  07-11-2009   EF 55-60%     Medications Prior to Admission: Prior to Admission medications   Medication Sig Start Date End Date Taking? Authorizing Provider  acetaminophen (TYLENOL) 500 MG tablet Take 1,000 mg by mouth every 6 (six) hours as needed for mild pain.   Yes [provider]  allopurinol (ZYLOPRIM) 100 MG tablet Take 100 mg by mouth daily. 07/07/18  Yes [provider]  amLODipine (NORVASC) 10 MG tablet Take 10 mg by mouth daily.  12/09/15  Yes [provider]  aspirin 81 MG tablet Take 81 mg by mouth daily.    Yes [provider]  HUMULIN 70/30 (70-30) 100 UNIT/ML injection Inject 15-40 Units into the skin 2 (two) times daily with a meal. Inject 40 units with breakfast and 15 units with supper. 12/24/15  Yes [provider]  hydrochlorothiazide (HYDRODIURIL) 25 MG tablet Take 25 mg by mouth daily.   Yes [provider]  minocycline (MINOCIN,DYNACIN) 100 MG capsule Take 100 mg by mouth daily.  02/11/13  Yes [provider]  rosuvastatin (CRESTOR) 40 MG tablet Take 40 mg by mouth daily.    Yes [provider]     Allergies:   No Known Allergies  Social History:   Social History   Socioeconomic History  . Marital status: Divorced    Spouse name: Not on file  . Number of children: Not on file  . Years of education: Not on file  . Highest education level: Not on file  Occupational History  . Not on file  Social Needs  . Financial resource strain: Not on file  . Food insecurity:    Worry: Not on file    Inability: Not on file  . Transportation needs:    Medical: Not on file    Non-medical: Not on  file  Tobacco Use  . Smoking status: Current Some Day Smoker    Packs/day: 0.10    Types: Cigars  . Smokeless tobacco: Never Used  Substance and Sexual Activity  . Alcohol use: No    Alcohol/week: 0.0 standard drinks  . Drug use: No  . Sexual activity: Not on file  Lifestyle  . Physical activity:    Days per week: Not on file    Minutes per session: Not on file  . Stress: Not on file  Relationships  . Social connections:    Talks on phone: Not on file    Gets together: Not on file    Attends religious service: Not on file    Active member of club or organization: Not on file    Attends meetings of clubs or organizations: Not on file    Relationship status: Not on file  . Intimate partner violence:    Fear of current or ex partner: Not on file    Emotionally abused: Not on file    Physically abused: Not on file    Forced sexual activity: Not on file  Other Topics Concern  . Not on file  Social History Narrative  . Not on file    Family History:   The patient's family history includes Cancer in his brother and brother; Colon cancer in his brother; Diabetes in his sister.    ROS:  Please see the history of present illness.  All other ROS reviewed and negative.     Physical Exam/Data:  There were no vitals filed for this visit. No intake or output data in the 24 hours ending 07/20/18 1046 There were no vitals filed for this visit. There is no height or weight on file to calculate BMI.  General:  Well nourished, well developed, in no acute distress HEENT: normal Lymph: no adenopathy Neck: no JVD Endocrine:  No thryomegaly Vascular: No carotid bruits Cardiac:  RRR, bradycardic; no murmurs, gallops or rubs Lungs:  CTA b/l, no wheezing, rhonchi or rales  Abd: soft, nontender  Ext: trace edema Musculoskeletal:  No deformities, BUE and BLE strength normal and equal Skin: warm and dry  Neuro:  No gross focal abnormalities noted Psych:  Normal affect    EKG:  The  ECGs that were done today was personally reviewed and demonstrates  #1 suspect is Mobitz I, RBBB, V rate 35 #2 is Mobitz I, RBBB, V rate 33  03/23/13: SR, RBBB, 55bpm   Relevant CV Studies:  03/29/18: stress myoview Impression Exercise Capacity:  Lexiscan with no exercise. BP Response:  Normal blood pressure response. Clinical Symptoms:  No significant symptoms noted. ECG Impression:  No significant ST segment change suggestive of  ischemia. Comparison with Prior Nuclear Study: No previous nuclear study performed  Overall Impression:  Normal stress nuclear study. LV Wall Motion:  Could not be gated due to ectopy  Laboratory Data:  Chemistry Recent Labs  Lab 07/20/18 0913  NA 140  K 4.2  CL 109  CO2 22  GLUCOSE 81  BUN 34*  CREATININE 2.07*  CALCIUM 10.5*  GFRNONAA 28*  GFRAA 33*  ANIONGAP 9    No results for input(s): PROT, ALBUMIN, AST, ALT, ALKPHOS, BILITOT in the last 168 hours. Hematology Recent Labs  Lab 07/20/18 0913  WBC 8.8  RBC 4.69  HGB 12.9*  HCT 41.2  MCV 87.8  MCH 27.5  MCHC 31.3  RDW 15.0  PLT 226   Cardiac EnzymesNo results for input(s): TROPONINI in the last 168 hours. No results for input(s): TROPIPOC in the last 168 hours.  BNPNo results for input(s): BNP, PROBNP in the last 168 hours.  DDimer No results for input(s): DDIMER in the last 168 hours.  Radiology/Studies:  No results found.  Assessment and Plan:   1. Bradycardia 2. Mobitz I block, known baseline RBBB  Dr. Caryl Comes has seen and examined the patient.  Not completely convinced the patient has symptomatic bradycardia He reports new exertional capacity decline, though when checks his BP at home, HR generally in the 60's-70's by his machine  Will get an echo, the patient's girlfriend is going to get his BP machine for [place] to review what his HR has been of late.  Dr. Caryl Comes, not convinced of need for pacing at this point BP very stable Patient is with no active symptoms  Trop is  negative No clear anginal sounding symptoms  3. Abn BUN/Creat     Unclear if this is new, last available labs from years ago   For questions or updates, please contact [place] Please consult [email or web address] for contact info under        Signed, Baldwin Jamaica, PA-C  07/20/2018 10:46 AM    1.  Mobitz 1 heart block  2.  Right bundle branch left anterior fascicular block-chronic  3.  Change in exercise tolerance about a month ago.  4.  Hypertension.  5.  Renal insufficiency grade 4.    The patient presents without complaints.  He denies dyspnea.  He denies weakness.  He does note however that about a month ago he went from being able to "work me to death "having to rest 15 minutes every 10 minutes he works.  He measures his blood pressure at home.  He has not noted a change in heart rate concurrent with the symptoms.  We will have him get his monitor.  In the event that there is indeed no correlation, pacing for asymptomatic or poorly correlated symptoms is contraindicated in Mobitz 1 heart block.  Hence, we would seek to identify symptomatic correlation with heart block going forward.  It also would beg the question as to what other mechanisms might be responsible for his acute changes in exercise tolerance.  Not withstanding the fact that his troponins are negative, evaluation of his coronary artery bed probably with stress testing given his renal function will be appropriate.  We will follow-up again later today after his wife brings in his home blood pressure recordings (with heart rates)

## 2018-07-20 NOTE — Telephone Encounter (Signed)
Received call from pre-surgical screening team, patient arrived today for lumbar fusion eval. Initial HR reported to be in 60s but EKG revealed HR 35 with RBBB and high grade heart block. He does not appear to be on any AVN blocking agents and the pre-op team corroborates this. BP was stable at 140s/60s and patient is totally asymptomatic. Have advised they escort patient to emergency room with pads in place for further testing. I will notify EP team of his impending arrival. Also spoke with ER triage nurse to make them aware. I would suggest istat labs on arrival to ER.  Dayna Dunn PA-C

## 2018-07-20 NOTE — H&P (View-Only) (Signed)
Cardiology Admission History and Physical:   Patient ID: Ozie Lupe MRN: [id number]; DOB: September 30, 1935   Admission date: 07/20/2018  Primary Care Provider: Crist Infante, MD Primary Cardiologist: Dr. Gwenlyn Found (last 2014) Primary Electrophysiologist:  None   Chief Complaint:  bradycardia  Patient Profile:   Taren Toops is a 83 y.o. male with PMHx of gout, DM, HTN, HLD, in 2014 was referred to Dr. Gwenlyn Found for RBBB with some symptoms back then had stress test that was negative for ischemia (not a gated study).  History of Present Illness:   Mr. Vandyke was seen today in the pre-op testing/RN for pre-op eval/question for a planned back surgery 07/25/18.  While there he was incidentally found to have CHB on his EKG, asymptomatic, and referred to the ER.  He feels well.  States he came for his pre-op testing for surgery planned next week on his back, and the next thing he knew everyone got crazy.  He denies any kind of CP, palpitations or cardiac awareness.  No near syncope or syncope.,  He is a semi-retired brick layer, and up until about a month ago, had excellent exertional capacity, though of late, when doing physical work he "gives out", this is sudden, but no syncope.  He states he needs to take a 15 minute break to work 10 minutes, and is highly unusual for him.  He reports years of occasional unsteady episodes that he has attributed to low BS.  LABS K+ 4.2 BUN/Creat 34/2.07 WBC 8.8 H/H 13/42 Plts 226   Past Medical History:  Diagnosis Date  . Anemia   . Arthritis    OCC LEG PAIN  . Chronic kidney disease    stage lll  . Diabetes mellitus    ORAL AND INSULIN MEDS  . History of bladder carcinoma TCC   S/P BCG TX'S   . History of gout CURRENTLY STABLE  . Hyperlipidemia   . Hypertension   . Impaired hearing BILATERAL    OCCASIONAL WEARS HEARING AIDS  . Right bundle branch block     Past Surgical History:  Procedure Laterality Date  . COLONOSCOPY  03/2004  . CYSTO /  RESECTIONAL BLADDER BX'S  05-01-2011  . CYSTOSCOPY WITH BIOPSY  10/05/2011   Procedure: CYSTOSCOPY WITH BIOPSY;  Surgeon: Claybon Jabs, MD;  Location: [facility];  Service: Urology;  Laterality: N/A;  BLADDER BIOPSY   . TRANSURETHRAL RESECTION OF BLADDER TUMOR  03-02-2011  . US ECHOCARDIOGRAPHY  07-11-2009   EF 55-60%     Medications Prior to Admission: Prior to Admission medications   Medication Sig Start Date End Date Taking? Authorizing Provider  acetaminophen (TYLENOL) 500 MG tablet Take 1,000 mg by mouth every 6 (six) hours as needed for mild pain.   Yes [provider]  allopurinol (ZYLOPRIM) 100 MG tablet Take 100 mg by mouth daily. 07/07/18  Yes [provider]  amLODipine (NORVASC) 10 MG tablet Take 10 mg by mouth daily.  12/09/15  Yes [provider]  aspirin 81 MG tablet Take 81 mg by mouth daily.    Yes [provider]  HUMULIN 70/30 (70-30) 100 UNIT/ML injection Inject 15-40 Units into the skin 2 (two) times daily with a meal. Inject 40 units with breakfast and 15 units with supper. 12/24/15  Yes [provider]  hydrochlorothiazide (HYDRODIURIL) 25 MG tablet Take 25 mg by mouth daily.   Yes [provider]  minocycline (MINOCIN,DYNACIN) 100 MG capsule Take 100 mg by mouth daily.  02/11/13  Yes [provider]  rosuvastatin (CRESTOR) 40 MG tablet Take 40 mg by mouth daily.    Yes [provider]     Allergies:   No Known Allergies  Social History:   Social History   Socioeconomic History  . Marital status: Divorced    Spouse name: Not on file  . Number of children: Not on file  . Years of education: Not on file  . Highest education level: Not on file  Occupational History  . Not on file  Social Needs  . Financial resource strain: Not on file  . Food insecurity:    Worry: Not on file    Inability: Not on file  . Transportation needs:    Medical: Not on file    Non-medical: Not on  file  Tobacco Use  . Smoking status: Current Some Day Smoker    Packs/day: 0.10    Types: Cigars  . Smokeless tobacco: Never Used  Substance and Sexual Activity  . Alcohol use: No    Alcohol/week: 0.0 standard drinks  . Drug use: No  . Sexual activity: Not on file  Lifestyle  . Physical activity:    Days per week: Not on file    Minutes per session: Not on file  . Stress: Not on file  Relationships  . Social connections:    Talks on phone: Not on file    Gets together: Not on file    Attends religious service: Not on file    Active member of club or organization: Not on file    Attends meetings of clubs or organizations: Not on file    Relationship status: Not on file  . Intimate partner violence:    Fear of current or ex partner: Not on file    Emotionally abused: Not on file    Physically abused: Not on file    Forced sexual activity: Not on file  Other Topics Concern  . Not on file  Social History Narrative  . Not on file    Family History:   The patient's family history includes Cancer in his brother and brother; Colon cancer in his brother; Diabetes in his sister.    ROS:  Please see the history of present illness.  All other ROS reviewed and negative.     Physical Exam/Data:  There were no vitals filed for this visit. No intake or output data in the 24 hours ending 07/20/18 1046 There were no vitals filed for this visit. There is no height or weight on file to calculate BMI.  General:  Well nourished, well developed, in no acute distress HEENT: normal Lymph: no adenopathy Neck: no JVD Endocrine:  No thryomegaly Vascular: No carotid bruits Cardiac:  RRR, bradycardic; no murmurs, gallops or rubs Lungs:  CTA b/l, no wheezing, rhonchi or rales  Abd: soft, nontender  Ext: trace edema Musculoskeletal:  No deformities, BUE and BLE strength normal and equal Skin: warm and dry  Neuro:  No gross focal abnormalities noted Psych:  Normal affect    EKG:  The  ECGs that were done today was personally reviewed and demonstrates  #1 suspect is Mobitz I, RBBB, V rate 35 #2 is Mobitz I, RBBB, V rate 33  03/23/13: SR, RBBB, 55bpm   Relevant CV Studies:  03/29/18: stress myoview Impression Exercise Capacity:  Lexiscan with no exercise. BP Response:  Normal blood pressure response. Clinical Symptoms:  No significant symptoms noted. ECG Impression:  No significant ST segment change suggestive of  ischemia. Comparison with Prior Nuclear Study: No previous nuclear study performed  Overall Impression:  Normal stress nuclear study. LV Wall Motion:  Could not be gated due to ectopy  Laboratory Data:  Chemistry Recent Labs  Lab 07/20/18 0913  NA 140  K 4.2  CL 109  CO2 22  GLUCOSE 81  BUN 34*  CREATININE 2.07*  CALCIUM 10.5*  GFRNONAA 28*  GFRAA 33*  ANIONGAP 9    No results for input(s): PROT, ALBUMIN, AST, ALT, ALKPHOS, BILITOT in the last 168 hours. Hematology Recent Labs  Lab 07/20/18 0913  WBC 8.8  RBC 4.69  HGB 12.9*  HCT 41.2  MCV 87.8  MCH 27.5  MCHC 31.3  RDW 15.0  PLT 226   Cardiac EnzymesNo results for input(s): TROPONINI in the last 168 hours. No results for input(s): TROPIPOC in the last 168 hours.  BNPNo results for input(s): BNP, PROBNP in the last 168 hours.  DDimer No results for input(s): DDIMER in the last 168 hours.  Radiology/Studies:  No results found.  Assessment and Plan:   1. Bradycardia 2. Mobitz I block, known baseline RBBB  Dr. Caryl Comes has seen and examined the patient.  Not completely convinced the patient has symptomatic bradycardia He reports new exertional capacity decline, though when checks his BP at home, HR generally in the 60's-70's by his machine  Will get an echo, the patient's girlfriend is going to get his BP machine for [place] to review what his HR has been of late.  Dr. Caryl Comes, not convinced of need for pacing at this point BP very stable Patient is with no active symptoms  Trop is  negative No clear anginal sounding symptoms  3. Abn BUN/Creat     Unclear if this is new, last available labs from years ago   For questions or updates, please contact [place] Please consult [email or web address] for contact info under        Signed, Baldwin Jamaica, PA-C  07/20/2018 10:46 AM    1.  Mobitz 1 heart block  2.  Right bundle branch left anterior fascicular block-chronic  3.  Change in exercise tolerance about a month ago.  4.  Hypertension.  5.  Renal insufficiency grade 4.    The patient presents without complaints.  He denies dyspnea.  He denies weakness.  He does note however that about a month ago he went from being able to "work me to death "having to rest 15 minutes every 10 minutes he works.  He measures his blood pressure at home.  He has not noted a change in heart rate concurrent with the symptoms.  We will have him get his monitor.  In the event that there is indeed no correlation, pacing for asymptomatic or poorly correlated symptoms is contraindicated in Mobitz 1 heart block.  Hence, we would seek to identify symptomatic correlation with heart block going forward.  It also would beg the question as to what other mechanisms might be responsible for his acute changes in exercise tolerance.  Not withstanding the fact that his troponins are negative, evaluation of his coronary artery bed probably with stress testing given his renal function will be appropriate.  We will follow-up again later today after his wife brings in his home blood pressure recordings (with heart rates)

## 2018-07-20 NOTE — Op Note (Signed)
Procedure report  Procedure performed:  1. Implantation of new dual chamber permanent pacemaker 2. Fluoroscopy   Reason for procedure: Symptomatic bradycardia due to: Second degree atrioventricular block Mobitz type I   Transient complete heart block  Procedure performed by: Sanda Klein, MD  Complications: None  Estimated blood loss: <10 mL  Medications administered during procedure: Ancef 2 g intravenously Lidocaine 1% 30 mL locally,   Device details: Generator [employer] Assurity MRI model [id number] serial number [id number] Right atrial lead [employer] [id number] serial number [id number] Right ventricular lead [employer] Jude [id number] serial number [id number]  Procedure details:  After the risks and benefits of the procedure were discussed the patient provided informed consent and was brought to the cardiac cath lab in the fasting state. The patient was prepped and draped in usual sterile fashion. Local anesthesia with 1% lidocaine was administered to to the left infraclavicular area. A 5-6 cm horizontal incision was made parallel with and 2-3 cm caudal to the left clavicle. Using electrocautery and blunt dissection a prepectoral pocket was created down to the level of the pectoralis major muscle fascia. The pocket was carefully inspected for hemostasis. An antibiotic-soaked sponge was placed in the pocket.  Under fluoroscopic guidance and using the modified Seldinger technique 2 separate venipunctures were performed to access the left subclavian vein. Some difficulty was encountered accessing the vein and US guidance was used.  Two J-tip guidewires were subsequently exchanged for 9 French safe sheaths.  Under fluoroscopic guidance the ventricular lead was advanced to level of the mid to apical right ventricular septum and thet active-fixation helix was deployed. Prominent current of injury was seen. Satisfactory pacing and sensing parameters were recorded. There was no evidence of  diaphragmatic stimulation at maximum device output. The safe sheath was peeled away and the lead was secured in place with 2-0 silk.  In similar fashion the right atrial lead was advanced to the level of the atrial appendage. The active-fixation helix was deployed. There was prominent current of injury. Satisfactory  pacing and sensing parameters were recorded. There was no evidence of diaphragmatic stimulation with pacing at maximum device output. The safe sheath was peeled away and the lead was secured in place with 2-0 silk.  The antibiotic-soaked sponge was removed from the pocket. The pocket was flushed with copious amounts of antibiotic solution. Reinspection showed excellent hemostasis..  The ventricular lead was connected to the generator and appropriate ventricular pacing was seen. Subsequently the atrial lead was also connected. Repeat testing of the lead parameters later showed excellent values.  The entire system was then carefully inserted in the pocket with care been taking that the leads and device assumed a comfortable position without pressure on the incision. Great care was taken that the leads be located deep to the generator. The pocket was then closed in layers using 2 layers of 2-0 Vicryl and cutaneous staples, after which a sterile dressing was applied.  At the end of the procedure the following lead parameters were encountered:  Right atrial lead sensed P waves 2.4 mV, impedance 725ohms, threshold 0.9 V at 0.5 ms pulse width.  Right ventricular lead sensed R waves 7.6 mV, impedance 706ohms, threshold 1.4 V at 0.5 ms pulse width. (through analyzer 2.80mV, 640 ohm, 1.2V@0 .5 ms). Prominent current of injury was still present at the end of the procedure.   Sanda Klein, MD, [facility] [phone] office [phone] pager

## 2018-07-20 NOTE — ED Triage Notes (Signed)
Pt from short-stay after arriving for pre-op prep; vitals displayed heart rate in 60s; however EKG shows heart rate in mid thirties; Pt A/Ox4; no signs of distress; denies SOB, denies N/V.

## 2018-07-20 NOTE — Progress Notes (Signed)
Asked by Dr. Caryl Comes to see for AV block and discuss dual chamber pacemaker. He remains in sinus rhythm with second degree, Mobitz type I AV block, frequently 2:1, with marked bradycardia (30s). Some of the "spells" (pre-syncope) that he ascribes to low blood sugar are probably due to bradycardia/pauses. He is not on any negative chronotropic agents. Despite Mobitz I pattern, he clearly has infrahisian disease as well (RBBB, LAFB). Echo show normal LVEF/wall motion and no major valvular abnormalities (diastolic MR/TR due to AV dissociation). He is an appropriate candidate for dual chamber permanent pacemaker implantation. This procedure has been fully reviewed with the patient and written informed consent has been obtained.  Sanda Klein, MD, [facility] [phone] office [phone] pager

## 2018-07-20 NOTE — Interval H&P Note (Signed)
History and Physical Interval Note:  07/20/2018 3:26 PM  Keith Drake  has presented today for surgery, with the diagnosis of complete heart block. The various methods of treatment have been discussed with the patient and family. After consideration of risks, benefits and other options for treatment, the patient has consented to  Procedure(s): PACEMAKER IMPLANT (N/A) as a surgical intervention .  The patient's history has been reviewed, patient examined, no change in status, stable for surgery.  I have reviewed the patient's chart and labs.  Questions were answered to the patient's satisfaction.     Keith Drake

## 2018-07-20 NOTE — Progress Notes (Addendum)
PCP - Dr. Joylene Draft Cardiologist - patient denies  Chest x-ray - n/a EKG - 07/20/2018 Stress Test - 03/29/2013 ECHO - 07/11/2009 Cardiac Cath - patient denies  Sleep Study - patient denies, stop bang negative  Fasting Blood Sugar - 110's Checks Blood Sugar 2 times a day  Aspirin Instructions: per Dr. Windy Carina office stop 1 week prior to surgery; last dose 07/17/2018  Anesthesia review: yes, abnormal EKG  Patient denies shortness of breath, fever, cough and chest pain at PAT appointment   Patient verbalized understanding of instructions that were given to them at the PAT appointment. Patient was also instructed that they will need to review over the PAT instructions again at home before surgery.   Patient's HR was 35bpm on EKG; patient is not symptomatic.  Patient states he does feel dizzy at times when he stands up but no associated syncope.  Jeneen Rinks, [place] was contacted and is meeting with patient.

## 2018-07-20 NOTE — Progress Notes (Signed)
I was notified by PAT nursing that pt's EKG showed bradycardia in the 30s with 2:1 AV conduction block. Review of records indicates this is new for him. On arrival to the clinic vitals check showed BP 142/61 with HR 63. When I saw the pt he was asymptomatic, sitting upright with HR ~60 by palpation. He denies any history of syncope, denies CP/SOB, reports he generally feels well. He does report feeling like his is often initially unsteady when getting up from a seated position. I notified Melina Copa, PA-C who advised pt be transported to ED for further eval. Pt transported via wheelchair with pads in place.  [facility] Short Stay Center/Anesthesiology Phone [phone] 07/20/2018 9:54 AM

## 2018-07-20 NOTE — Progress Notes (Signed)
Ate [place] sandwich

## 2018-07-20 NOTE — Progress Notes (Signed)
  Echocardiogram 2D Echocardiogram has been performed.  Keith Drake 07/20/2018, 12:23 PM

## 2018-07-20 NOTE — ED Provider Notes (Signed)
Emergency Department Provider Note   I have reviewed the triage vital signs and the nursing notes.   HISTORY  Chief Complaint Bradycardia   HPI Keith Drake is a 83 y.o. male without significant past medical history the presents to the emergency department today from short stay.  He was here to get preadmission lab and testing and was found to be bradycardic on EKG heart rate in the 30s.  Patient is asymptomatic at this time.  Sometimes he will have some lightheadedness with movement but nothing particularly persistent recently.  No chest pain, back pain, abdominal pain, shortness of breath, vision changes or other associated symptoms. No other associated or modifying symptoms.    Past Medical History:  Diagnosis Date  . Anemia   . Arthritis    OCC LEG PAIN  . Chronic kidney disease    stage lll  . Diabetes mellitus    ORAL AND INSULIN MEDS  . History of bladder carcinoma TCC   S/P BCG TX'S   . History of gout CURRENTLY STABLE  . Hyperlipidemia   . Hypertension   . Impaired hearing BILATERAL    OCCASIONAL WEARS HEARING AIDS  . Right bundle branch block     Patient Active Problem List   Diagnosis Date Noted  . Essential hypertension 03/23/2013  . Hyperlipidemia 03/23/2013  . Insulin dependent diabetes mellitus ([place]) 03/23/2013  . Right bundle branch block     Past Surgical History:  Procedure Laterality Date  . COLONOSCOPY  03/2004  . CYSTO / RESECTIONAL BLADDER BX'S  05-01-2011  . CYSTOSCOPY WITH BIOPSY  10/05/2011   Procedure: CYSTOSCOPY WITH BIOPSY;  Surgeon: Claybon Jabs, MD;  Location: [facility];  Service: Urology;  Laterality: N/A;  BLADDER BIOPSY   . TRANSURETHRAL RESECTION OF BLADDER TUMOR  03-02-2011  . US ECHOCARDIOGRAPHY  07-11-2009   EF 55-60%    Current Outpatient Rx  . Order #: [id number] Class: Historical Med  . Order #: [id number] Class: Historical Med  . Order #: [id number] Class: Historical Med  . Order #: [id number] Class:  Historical Med  . Order #: [id number] Class: Historical Med  . Order #: [id number] Class: Historical Med  . Order #: [id number] Class: Historical Med  . Order #: [id number] Class: Historical Med    Allergies Patient has no known allergies.  Family History  Problem Relation Age of Onset  . Colon cancer Brother   . Cancer Brother   . Cancer Brother   . Diabetes Sister     Social History Social History   Tobacco Use  . Smoking status: Current Some Day Smoker    Packs/day: 0.10    Types: Cigars  . Smokeless tobacco: Never Used  Substance Use Topics  . Alcohol use: No    Alcohol/week: 0.0 standard drinks  . Drug use: No    Review of Systems  All other systems negative except as documented in the HPI. All pertinent positives and negatives as reviewed in the HPI. ____________________________________________   PHYSICAL EXAM:  VITAL SIGNS: There were no vitals filed for this visit.   Constitutional: Alert and oriented. Well appearing and in no acute distress. Eyes: Conjunctivae are normal. PERRL. EOMI. Head: Atraumatic. Nose: No congestion/rhinnorhea. Mouth/Throat: Mucous membranes are moist.  Oropharynx non-erythematous. Neck: No stridor.  No meningeal signs.   Cardiovascular: bradycardic rate, regular rhythm. Good peripheral circulation. Grossly normal heart sounds.   Respiratory: Normal respiratory effort.  No retractions. Lungs CTAB. Gastrointestinal: Soft and nontender. No distention.  Musculoskeletal: No lower extremity tenderness nor edema.  No gross deformities of extremities. Neurologic:  Normal speech and language. No gross focal neurologic deficits are appreciated.  Skin:  Skin is warm, dry and intact. No rash noted.   ____________________________________________   LABS (all labs ordered are listed, but only abnormal results are displayed)  Labs Reviewed  COMPREHENSIVE METABOLIC PANEL - Abnormal; Notable for the following components:      Result Value    Chloride 112 (*)    CO2 20 (*)    Glucose, Bld 57 (*)    BUN 33 (*)    Creatinine, Ser 2.08 (*)    Calcium 10.5 (*)    GFR calc non Af Amer 28 (*)    GFR calc Af Amer 33 (*)    All other components within normal limits  LIPID PANEL - Abnormal; Notable for the following components:   HDL 27 (*)    All other components within normal limits  CBG MONITORING, ED - Abnormal; Notable for the following components:   Glucose-Capillary 61 (*)    All other components within normal limits  CBG MONITORING, ED - Abnormal; Notable for the following components:   Glucose-Capillary 249 (*)    All other components within normal limits  CBG MONITORING, ED - Abnormal; Notable for the following components:   Glucose-Capillary 127 (*)    All other components within normal limits  CBG MONITORING, ED - Abnormal; Notable for the following components:   Glucose-Capillary 165 (*)    All other components within normal limits  CBG MONITORING, ED - Abnormal; Notable for the following components:   Glucose-Capillary 137 (*)    All other components within normal limits  CBC WITH DIFFERENTIAL/PLATELET  PROTIME-INR  APTT  TROPONIN I   ____________________________________________  EKG   EKG Interpretation  Date/Time:  Wednesday July 20 2018 10:09:29 EDT Ventricular Rate:  33 PR Interval:    QRS Duration: 159 QT Interval:  512 QTC Calculation: 380 R Axis:   -73 Text Interpretation:  heart block, CHB vs mobitz RBBB and LAFB Artifact in lead(s) I III aVL aVF V1 V2 V3 V4 V5 V6 Confirmed by Merrily Pew [phone]) on 07/20/2018 11:57:31 AM      ____________________________________________  RADIOLOGY  Dg Chest Port 1 View  Result Date: 07/20/2018 CLINICAL DATA:  Bradycardia. EXAM: PORTABLE CHEST 1 VIEW COMPARISON:  Radiographs of Feb 26, 2011. FINDINGS: Stable cardiomediastinal silhouette. No pneumothorax or pleural effusion is noted. No acute pulmonary disease is noted. Bony thorax is unremarkable.  IMPRESSION: No acute cardiopulmonary abnormality seen. Electronically Signed   By: Marijo Conception, M.D.   On: 07/20/2018 10:45    ____________________________________________   PROCEDURES  Procedure(s) performed:   Procedures  CRITICAL CARE Performed by: Merrily Pew Total critical care time: 35 minutes Critical care time was exclusive of separately billable procedures and treating other patients. Critical care was necessary to treat or prevent imminent or life-threatening deterioration. Critical care was time spent personally by me on the following activities: development of treatment plan with patient and/or surrogate as well as nursing, discussions with consultants, evaluation of patient's response to treatment, examination of patient, obtaining history from patient or surrogate, ordering and performing treatments and interventions, ordering and review of laboratory studies, ordering and review of radiographic studies, pulse oximetry and re-evaluation of patient's condition.  ____________________________________________   INITIAL IMPRESSION / ASSESSMENT AND PLAN / ED COURSE  Profound bradycardia.  Not on any beta-blockers that I can see.  Does seem to be asymptomatic at this time however will  probably need cardiology consultation and admission after work-up is completed.  Pads placed.  Cardiology seen and will place a pacer?  Pertinent labs & imaging results that were available during my care of the patient were reviewed by me and considered in my medical decision making (see chart for details).  ____________________________________________  FINAL CLINICAL IMPRESSION(S) / ED DIAGNOSES  Final diagnoses:  Complete heart block (HCC)  Bradycardia     MEDICATIONS GIVEN DURING THIS VISIT:  Medications  0.9 %  sodium chloride infusion (has no administration in time range)  dextrose 50 % solution 50 mL (50 mLs Intravenous Given 07/20/18 1015)     NEW OUTPATIENT MEDICATIONS  STARTED DURING THIS VISIT:  New Prescriptions   No medications on file    Note:  This note was prepared with assistance of Dragon voice recognition software. Occasional wrong-word or sound-a-like substitutions may have occurred due to the inherent limitations of voice recognition software.   Merrily Pew, MD 07/20/18 [phone]

## 2018-07-21 ENCOUNTER — Encounter (HOSPITAL_COMMUNITY): Payer: Self-pay | Admitting: Cardiovascular Disease

## 2018-07-21 ENCOUNTER — Other Ambulatory Visit: Payer: Self-pay | Admitting: Physician Assistant

## 2018-07-21 ENCOUNTER — Ambulatory Visit (HOSPITAL_COMMUNITY): Payer: Medicare Other

## 2018-07-21 DIAGNOSIS — R001 Bradycardia, unspecified: Secondary | ICD-10-CM | POA: Diagnosis not present

## 2018-07-21 DIAGNOSIS — I081 Rheumatic disorders of both mitral and tricuspid valves: Secondary | ICD-10-CM | POA: Diagnosis not present

## 2018-07-21 DIAGNOSIS — I129 Hypertensive chronic kidney disease with stage 1 through stage 4 chronic kidney disease, or unspecified chronic kidney disease: Secondary | ICD-10-CM | POA: Diagnosis not present

## 2018-07-21 DIAGNOSIS — I442 Atrioventricular block, complete: Secondary | ICD-10-CM | POA: Diagnosis not present

## 2018-07-21 DIAGNOSIS — I441 Atrioventricular block, second degree: Secondary | ICD-10-CM | POA: Diagnosis not present

## 2018-07-21 DIAGNOSIS — N289 Disorder of kidney and ureter, unspecified: Secondary | ICD-10-CM

## 2018-07-21 LAB — GLUCOSE, CAPILLARY: Glucose-Capillary: 157 mg/dL — ABNORMAL HIGH (ref 70–99)

## 2018-07-21 MED FILL — Fentanyl Citrate Preservative Free (PF) Inj 100 MCG/2ML: INTRAMUSCULAR | Qty: 2 | Status: AC

## 2018-07-21 MED FILL — Midazolam HCl Inj 5 MG/5ML (Base Equivalent): INTRAMUSCULAR | Qty: 5 | Status: AC

## 2018-07-21 MED FILL — Lidocaine HCl Local Inj 1%: INTRAMUSCULAR | Qty: 60 | Status: AC

## 2018-07-21 NOTE — Discharge Instructions (Signed)
° ° °  Supplemental Discharge Instructions for  Pacemaker/Defibrillator Patients  Activity No heavy lifting or vigorous activity with your left/right arm for 6 to 8 weeks.  Do not raise your left/right arm above your head for one week.  Gradually raise your affected arm as drawn below.             07/24/18                     07/25/18                     07/26/18                  07/27/18  __  NO DRIVING for  1 week  ; you may begin driving on  12/02/35  .  WOUND CARE - Keep the wound area clean and dry.  Do not get this area wet, no showers until cleared to at your wound check visit. - The tape/steri-strips on your wound will fall off; do not pull them off.  No bandage is needed on the site.  DO  NOT apply any creams, oils, or ointments to the wound area. - If you notice any drainage or discharge from the wound, any swelling or bruising at the site, or you develop a fever > 101? F after you are discharged home, call the office at once.  Special Instructions - You are still able to use cellular telephones; use the ear opposite the side where you have your pacemaker/defibrillator.  Avoid carrying your cellular phone near your device. - When traveling through airports, show security personnel your identification card to avoid being screened in the metal detectors.  Ask the security personnel to use the hand wand. - Avoid arc welding equipment, MRI testing (magnetic resonance imaging), TENS units (transcutaneous nerve stimulators).  Call the office for questions about other devices. - Avoid electrical appliances that are in poor condition or are not properly grounded. - Microwave ovens are safe to be near or to operate.  Additional information for defibrillator patients should your device go off: - If your device goes off ONCE and you feel fine afterward, notify the device clinic nurses. - If your device goes off ONCE and you do not feel well afterward, call 911. - If your device goes off TWICE,  call 911. - If your device goes off THREE times in one day, call 911.  DO NOT DRIVE YOURSELF OR A FAMILY MEMBER WITH A DEFIBRILLATOR TO THE HOSPITAL--CALL 911.

## 2018-07-21 NOTE — Discharge Summary (Addendum)
ELECTROPHYSIOLOGY PROCEDURE DISCHARGE SUMMARY    Patient ID: Keith Drake,  MRN: [id number], DOB/AGE: 1934-11-12 83 y.o.  Admit date: 07/20/2018 Discharge date: 07/21/2018  Primary Care Physician: Crist Infante, MD  Primary Cardiologist: new, Dr. Sallyanne Kuster   Primary Discharge Diagnosis:  1. Symptomatic bradycardia  Secondary Discharge Diagnosis:  1. HTN 2. DM 3. HLD 4. CKD (III)     Follows with Dr. Justin Mend  No Known Allergies   Procedures This Admission:  1.  Implantation of a SJM dual chamber PPM on 07/20/18 by Dr Sallyanne Kuster.  The patient received a St. Jude Assurity MRI model [id number] serial number [id number], Right atrial lead St. Jude [id number] serial number [id number], Right ventricular lead St. Jude [id number] serial number [id number] There were no immediate post procedure complications. 2.  CXR on 07/21/18 demonstrated no pneumothorax status post device implantation.   Brief HPI: Keith Drake is a 83 y.o. male was seen in the pre-op staff here for his planned back surgery on Monday, with this visit noted to be very bradycardic with abnormal EKG, and referred to the ER for further evaluation.  He had no physical complaints his BP stable.  He was noted to be in Mobitz I with V rates in 30's.   Hospital Course:  The patient reported no symptoms, denies any kind of CP or palpitations, only with his CBP.  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  He stated he needed to take a 15 minute break to work 10 minutes, and was highly unusual for him.  His new exertional intolerance felt 2/2 to his bradycardia.  No reversible causes were identified, labs unremarkable except for elevated BUN/Creat, he  follows with Dr. Justin Mend, Narda [facility] with known CKD (III), f/u BMET at his wound check visit is ordered.  TTE noted LVEF 60-65%, no significant VHD noted.  He was admitted and underwent implantation of a PPM with details as outlined above.  was monitored on telemetry overnight which demonstrated AV paced.  Left chest was without hematoma or ecchymosis.  The device was interrogated and found to be functioning normally, he is device dependent this AM.  CXR was obtained and demonstrated no pneumothorax status post device implantation.  Wound care, arm mobility, and restrictions were reviewed with the patient.  The patient feels well this morning, no CP or SOB, he was examined by Dr. Caryl Comes and considered stable for discharge to home.   In discussion with Dr. Sallyanne Kuster, is OK from his perspective to proceed with his back surgery as planned though patient's left arm is restricted and can not be lifted past shoulder level here in the short term.  He will need routine peri-operative pacemaker management.   Physical Exam: Vitals:   07/20/18 1930 07/20/18 2000 07/21/18 0637 07/21/18 0803  BP: (!) 155/79  (!) 148/79 (!) 141/64  Pulse: 70 75 60 65  Resp: (!) 21 (!) 22 17 18   Temp: (!) 97.4 F (36.3 C)  98.4 F (36.9 C) 98.1 F (36.7 C)  TempSrc: Axillary  Oral Oral  SpO2: 100% 95% 99% 97%  Weight:   89 kg     GEN- The patient is well appearing, alert and oriented x 3 today.  HEENT: normocephalic, atraumatic; sclera clear, conjunctiva pink; hearing intact; oropharynx clear; neck supple, no JVP Lungs- CTA b/l, normal work of breathing.  No wheezes, rales, rhonchi Heart- RRR, no murmurs, rubs or gallops, PMI not laterally displaced GI- soft, non-tender, non-distended Extremities- no clubbing, cyanosis, or edema MS- no significant deformity or atrophy Skin- warm and dry, no rash or lesion, left chest with staples in place, is clean/dry, without hematoma/ecchymosis Psych- euthymic mood, full  affect Neuro- no gross deficits   Labs:   Lab Results  Component Value Date   WBC 9.9 07/20/2018   HGB 13.2 07/20/2018   HCT 42.4 07/20/2018   MCV 89.3 07/20/2018   PLT 243 07/20/2018    Recent Labs  Lab 07/20/18 1012  NA 142  K 4.3  CL 112*  CO2 20*  BUN 33*  CREATININE 2.08*  CALCIUM 10.5*  PROT 7.1  BILITOT 0.6  ALKPHOS 61  ALT 21  AST 23  GLUCOSE 57*    Discharge Medications:  Allergies as of 07/21/2018   No Known Allergies     Medication List    TAKE these medications   acetaminophen 500 MG tablet Commonly known as:  TYLENOL Take 1,000 mg by mouth every 6 (six) hours as needed for mild pain.   allopurinol 100 MG tablet Commonly known as:  ZYLOPRIM Take 100 mg by mouth daily.   amLODipine 10 MG tablet Commonly known as:  NORVASC Take 10 mg by mouth daily.   aspirin 81 MG tablet Take 81 mg by mouth daily.   HUMULIN 70/30 (70-30) 100 UNIT/ML injection Generic drug:  insulin NPH-regular Human Inject 15-40 Units into the skin 2 (two) times daily with a meal. Inject 40 units with breakfast and 15 units with supper.   hydrochlorothiazide 25 MG tablet Commonly known as:  HYDRODIURIL Take 25 mg by mouth daily.   minocycline 100 MG capsule Commonly known as:  MINOCIN,DYNACIN Take 100 mg by mouth daily.   rosuvastatin 40 MG tablet Commonly known as:  CRESTOR Take 40 mg by mouth daily.       Disposition:  Home  Discharge Instructions    Diet - low sodium heart healthy   Complete by:  As directed    Increase activity slowly   Complete by:  As directed      Follow-up Information    [facility] Follow up on 08/03/2018.   Specialty:  Cardiology Why:  12:00PM (noon), wound check visit Contact information: [address] [place] [phone]       Sanda Klein, MD Follow up on 10/28/2018.   Specialty:  Cardiology Why:  9:40AM Contact information: [address] [place] [address] [phone]           Duration of Discharge Encounter: Greater than 30 minutes including physician time.  Venetia Night, PA-C 07/21/2018 9:21 AM  Instructions given.  Chest x-ray reviewed. Device function normal.

## 2018-07-25 ENCOUNTER — Encounter (HOSPITAL_COMMUNITY): Admission: RE | Payer: Self-pay | Source: Ambulatory Visit

## 2018-07-25 ENCOUNTER — Inpatient Hospital Stay (HOSPITAL_COMMUNITY): Admission: RE | Admit: 2018-07-25 | Payer: Medicare Other | Source: Ambulatory Visit | Admitting: Neurosurgery

## 2018-07-25 SURGERY — POSTERIOR LUMBAR FUSION 1 LEVEL
Anesthesia: General | Site: Back

## 2018-07-26 ENCOUNTER — Telehealth: Payer: Self-pay | Admitting: Cardiovascular Disease

## 2018-07-26 NOTE — Telephone Encounter (Signed)
New Message     [facility] Pre-operative Risk Assessment    Request for surgical clearance:  1. What type of surgery is being performed? Lumbar Fushion  2. When is this surgery scheduled? TBD  3. What type of clearance is required (medical clearance vs. Pharmacy clearance to hold med vs. Both)? Both  4. Are there any medications that need to be held prior to surgery and how long? Any Blood thinners  5. Practice name and name of physician performing surgery? [facility]/ Dr.Cram  6. What is your office phone number [phone] ext. [phone]    7.   What is your office fax number [phone]  8.   Anesthesia type (None, local, MAC, general) ? General   Nicholes Stairs 07/26/2018, 4:37 PM  _________________________________________________________________   (provider comments below)   Calling to get clarification on the notes in epic by dayna dunn and pts surgical clearance for lumbar fushion and wants to see if we can check for blood thinners. Please call

## 2018-07-27 NOTE — Telephone Encounter (Signed)
Patient just underwent dual chamber pacer due to complete heart block.  Will follow up with Dr. Sallyanne Kuster before providing clearance.  Richardson Dopp, PA-C    07/27/2018 5:42 PM

## 2018-07-28 ENCOUNTER — Telehealth: Payer: Self-pay

## 2018-07-28 NOTE — Telephone Encounter (Signed)
Yes, OK for surgery. I think I already put a letter in the system MCr

## 2018-07-28 NOTE — Telephone Encounter (Signed)
Dr. Victorino December letter for surgical clearance faxed to [facility].

## 2018-07-28 NOTE — Telephone Encounter (Signed)
   Primary Cardiologist: Sanda Klein, MD  Chart reviewed as part of pre-operative protocol coverage. Given past medical history and time since last visit, based on ACC/AHA guidelines, Keith Drake would be at acceptable risk for the planned procedure without further cardiovascular testing.   Recommend continuation of ASA, but can hold 5 days prior to surgery if absolutely necessary.   I will route this recommendation to the requesting party via Epic fax function and remove from pre-op pool.  Please call with questions.  Lyda Jester, PA-C 07/28/2018, 2:10 PM

## 2018-07-29 ENCOUNTER — Telehealth: Payer: Self-pay | Admitting: Cardiovascular Disease

## 2018-07-29 NOTE — Telephone Encounter (Signed)
No message needed °

## 2018-07-29 NOTE — Telephone Encounter (Signed)
Discussed with Dr Sallyanne Kuster in the office today. Pt is cleared for proposed surgery at any time. He mentioned the pt should not have his left arm extended over his head if possible. He also mentioned the patient is pacemaker dependent.   Kerin Ransom PA-C 07/29/2018 12:41 PM

## 2018-07-29 NOTE — Telephone Encounter (Signed)
Follow-up  Keith Drake from Dr. Windy Carina office needs clarification on how soon after pacemaker placement he can have his lumbar spine surgery under general anaestheti? Please call to discuss.

## 2018-08-03 ENCOUNTER — Ambulatory Visit (INDEPENDENT_AMBULATORY_CARE_PROVIDER_SITE_OTHER): Payer: Medicare Other | Admitting: *Deleted

## 2018-08-03 ENCOUNTER — Other Ambulatory Visit: Payer: Medicare Other | Admitting: *Deleted

## 2018-08-03 DIAGNOSIS — Z950 Presence of cardiac pacemaker: Secondary | ICD-10-CM | POA: Diagnosis not present

## 2018-08-03 DIAGNOSIS — N289 Disorder of kidney and ureter, unspecified: Secondary | ICD-10-CM

## 2018-08-03 DIAGNOSIS — R001 Bradycardia, unspecified: Secondary | ICD-10-CM

## 2018-08-03 DIAGNOSIS — I441 Atrioventricular block, second degree: Secondary | ICD-10-CM

## 2018-08-03 LAB — CUP PACEART INCLINIC DEVICE CHECK
Battery Voltage: 3.11 V
Brady Statistic RA Percent Paced: 24 %
Date Time Interrogation Session: 20191009154936
Implantable Lead Implant Date: 20190925
Implantable Lead Implant Date: 20190925
Implantable Lead Location: 753859
Implantable Lead Location: 753860
Implantable Pulse Generator Implant Date: 20190925
Lead Channel Impedance Value: 525 Ohm
Lead Channel Impedance Value: 537.5 Ohm
Lead Channel Pacing Threshold Amplitude: 0.625 V
Lead Channel Pacing Threshold Amplitude: 0.75 V
Lead Channel Pacing Threshold Pulse Width: 0.5 ms
Lead Channel Pacing Threshold Pulse Width: 0.5 ms
Lead Channel Sensing Intrinsic Amplitude: 4.7 mV
Lead Channel Setting Pacing Amplitude: 0.875
Lead Channel Setting Pacing Amplitude: 3.5 V
Lead Channel Setting Sensing Sensitivity: 2 mV
MDC IDC MSMT BATTERY REMAINING LONGEVITY: 112 mo
MDC IDC MSMT LEADCHNL RV SENSING INTR AMPL: 4.3 mV
MDC IDC SET LEADCHNL RV PACING PULSEWIDTH: 0.5 ms
MDC IDC STAT BRADY RV PERCENT PACED: 97 %
Pulse Gen Model: 2272
Pulse Gen Serial Number: 9066127

## 2018-08-03 LAB — BASIC METABOLIC PANEL
BUN/Creatinine Ratio: 15 (ref 10–24)
BUN: 36 mg/dL — ABNORMAL HIGH (ref 8–27)
CO2: 22 mmol/L (ref 20–29)
CREATININE: 2.36 mg/dL — AB (ref 0.76–1.27)
Calcium: 10.6 mg/dL — ABNORMAL HIGH (ref 8.6–10.2)
Chloride: 102 mmol/L (ref 96–106)
GFR calc Af Amer: 29 mL/min/{1.73_m2} — ABNORMAL LOW (ref >59)
GFR, EST NON AFRICAN AMERICAN: 25 mL/min/{1.73_m2} — AB (ref >59)
Glucose: 183 mg/dL — ABNORMAL HIGH (ref 65–99)
Potassium: 4.9 mmol/L (ref 3.5–5.2)
Sodium: 140 mmol/L (ref 134–144)

## 2018-08-03 NOTE — Progress Notes (Signed)
Wound check appointment. Steri-strips removed. Wound without redness or edema. Incision edges approximated, wound well healed. Normal device function. RA thresholds, sensing, and impedances consistent with implant measurements. RV threshold and impedance stable, R-waves measure 4.64mV today, undersensing noted with RV sensitivity at 4.6mV when running sensing tests, intermittent HB, otherwise conducting with P-R measuring ~233ms (see attached rhythm strip). No changes to AV delays today. Received verbal order from Dr. Rayann Heman to increase RV sensitivity to 2.21mV, no noise noted with isometrics; will route this note to Dr. Sallyanne Kuster for review per Dr. Jackalyn Lombard recommendations. Device programmed at 3.5V in RA with auto capture programmed on in RV for extra safety margin until 3 month visit. Histogram distribution appropriate for patient and level of activity. No mode switches or high ventricular rates noted. Patient educated about wound care, arm mobility, lifting restrictions, and Merlin monitor. ROV with [place] on 10/28/18.

## 2018-08-04 ENCOUNTER — Telehealth: Payer: Self-pay | Admitting: *Deleted

## 2018-08-04 NOTE — Telephone Encounter (Signed)
-----   Message from [facility], [place] sent at 08/04/2018  2:45 PM EDT ----- Kidney function a little down from last labs in the hospital.  Please ask the patient to call his kidney specialist, he sees dr. Justin Mend at [facility].  Please forward to him and make his nurse aware as well for follow up.    Thanks renee

## 2018-08-12 ENCOUNTER — Encounter (HOSPITAL_COMMUNITY): Payer: Self-pay | Admitting: *Deleted

## 2018-08-12 ENCOUNTER — Other Ambulatory Visit: Payer: Self-pay

## 2018-08-12 NOTE — Progress Notes (Signed)
Pt denies any acute cardiopulmonary issues. Pt stated that last dose of Aspirin was Sunday as instructed by surgeon. Pt made aware to stop  taking vitamins, fish oil and herbal medications. Do not take any NSAIDs ie: Ibuprofen, Advil, Naproxen (Aleve), Motrin, BC and Goody Powder. Pt made aware to take 28 units of Humulin 70 /30 instead of 40 on Sunday morning, 17 units at HS instead of 25 and no insulin the DOS. Pt made aware to check BG every 2 hours prior to arrival to hospital on DOS. Pt made aware to treat a BG < 70 with  4 ounces of apple juice, wait 15 minutes after intervention to recheck BG, if BG remains < 70, call Short Stay unit to speak with a nurse.Pt verbalized understanding of all pre-op instructions. See anesthesia note.

## 2018-08-12 NOTE — Progress Notes (Signed)
Anesthesia Chart Review: SAME DAY WORKUP   Case:  [id number] Date/Time:  08/15/18 1151   Procedure:  PLIF - L4-L5 (N/A Back)   Anesthesia type:  General   Pre-op diagnosis:  Spondylolisthesis   Location:  [facility] ROOM 20 / [facility]   Surgeon:  Kary Kos, MD      DISCUSSION: 83 yo male current smoker. Pertinent hx includes HTN, Gout, CKDII, IDDM, Anemia, CHB treated with St Jude dual chamber pacemaker 07/20/2018.  Pt originally scheduled to have above procedure several weeks ago, however he presented to PAT 07/20/2018 in [facility] and was sent to ED and subsequently had pacemaker implanted later that day.  Pt cleared for surgery per telephone encounter by Kerin Ransom 07/29/2018 "Discussed with Dr Sallyanne Kuster in the office today. Pt is cleared for proposed surgery at any time. He mentioned the pt should not have his left arm extended over his head if possible. He also mentioned the patient is pacemaker dependent."  Anticipate he can proceed as planned barring acute status change.  VS: There were no vitals taken for this visit.  PROVIDERS: Crist Infante, MD is PCP  Croitoru, Dani Gobble is Cardiologist  LABS: BMP from 08/03/2018 displayed below. Elevated creatinine c/w pt's CKD. Will need CBC on DOS.  Ref. Range 07/20/2018 09:13 07/20/2018 10:12 08/03/2018 11:43  Creatinine Latest Ref Range: 0.76 - 1.27 mg/dL 2.07 (H) 2.08 (H) 2.36 (H)    Ref. Range 08/03/2018 75:64  BASIC METABOLIC PANEL Unknown Rpt (A)  Sodium Latest Ref Range: 134 - 144 mmol/L 140  Potassium Latest Ref Range: 3.5 - 5.2 mmol/L 4.9  Chloride Latest Ref Range: 96 - 106 mmol/L 102  CO2 Latest Ref Range: 20 - 29 mmol/L 22  Glucose Latest Ref Range: 65 - 99 mg/dL 183 (H)  BUN Latest Ref Range: 8 - 27 mg/dL 36 (H)  Creatinine Latest Ref Range: 0.76 - 1.27 mg/dL 2.36 (H)  Calcium Latest Ref Range: 8.6 - 10.2 mg/dL 10.6 (H)  BUN/Creatinine Ratio Latest Ref Range: 10 - 24  15  GFR, Est Non African American Latest Ref Range: >59 mL/min/1.73 25 (L)   GFR, Est African American Latest Ref Range: >59 mL/min/1.73 29 (L)    IMAGES: CHEST - 2 VIEW 07/21/18  COMPARISON:  Portable chest 07/20/2018. PA and lateral views 02/26/2011.  FINDINGS: PA and lateral views. New left chest dual lead cardiac pacemaker. Leads course to the RA and RV apex region. No pneumothorax or adverse features identified. Stable lung volumes. Both lungs appear clear. Mediastinal contours remain within normal limits. Visualized tracheal air column is within normal limits. Negative visible bowel gas pattern. No acute osseous abnormality identified.  IMPRESSION: Left chest cardiac pacemaker placed with no adverse features or acute cardiopulmonary abnormality.  EKG: 07/21/2018: AV dual-paced rhythm.  CV: TTE 07/20/2018: Study Conclusions  - Left ventricle: The cavity size was normal. Systolic function was   normal. The estimated ejection fraction was in the range of 60%   to 65%. Wall motion was normal; there were no regional wall   motion abnormalities. The study is not technically sufficient to   allow evaluation of LV diastolic function. - Aortic valve: Valve area (VTI): 2.59 cm^2. Valve area (Vmax):   2.61 cm^2. Valve area (Vmean): 2.61 cm^2. - Mitral valve: There was no regurgitation. Diastolic regurgitation   was present, consistent with AV dyssynchrony. - Tricuspid valve: There was mild regurgitation directed centrally.   Diastolic regurgitation was present.  Past Medical History:  Diagnosis Date  .  Anemia   . Arthritis    OCC LEG PAIN  . Bladder cancer ([place]) TCC   History of bladder carcinoma; S/P BCG TX'S ; "never treated for cancer; it was a spot on my bladder" (07/20/2018)  . CKD (chronic kidney disease), stage III ([place])   . History of gout    "on daily RX" (07/20/2018)  . History of kidney stones   . Hyperlipidemia   . Hypertension   . Impaired hearing BILATERAL    OCCASIONAL WEARS HEARING AIDS  . Mobitz I    [name] 07/20/2018  .  Pneumonia  ~ 1980s X 1  . Presence of permanent cardiac pacemaker 07/20/2018  . Right bundle branch block   . Type II diabetes mellitus ([place])     Past Surgical History:  Procedure Laterality Date  . COLONOSCOPY  03/2004  . CYSTO / RESECTIONAL BLADDER BX'S  05-01-2011  . CYSTOSCOPY WITH BIOPSY  10/05/2011   Procedure: CYSTOSCOPY WITH BIOPSY;  Surgeon: Claybon Jabs, MD;  Location: [facility];  Service: Urology;  Laterality: N/A;  BLADDER BIOPSY   . CYSTOSCOPY WITH LITHOLAPAXY    . EXCISIONAL HEMORRHOIDECTOMY  1980s  . INSERT / REPLACE / REMOVE PACEMAKER  07/20/2018   Implantation of new dual chamber permanent pacemaker  . PACEMAKER IMPLANT N/A 07/20/2018   Procedure: PACEMAKER IMPLANT;  Surgeon: Sanda Klein, MD;  Location: [facility];  Service: Cardiovascular;  Laterality: N/A;  . REFRACTIVE SURGERY Bilateral   . TRANSURETHRAL RESECTION OF BLADDER TUMOR  03-02-2011  . US ECHOCARDIOGRAPHY  07-11-2009   EF 55-60%    MEDICATIONS: No current facility-administered medications for this encounter.    Marland Kitchen acetaminophen (TYLENOL) 500 MG tablet  . allopurinol (ZYLOPRIM) 100 MG tablet  . amLODipine (NORVASC) 10 MG tablet  . aspirin 81 MG tablet  . HUMULIN 70/30 (70-30) 100 UNIT/ML injection  . hydrochlorothiazide (HYDRODIURIL) 25 MG tablet  . minocycline (MINOCIN,DYNACIN) 100 MG capsule  . rosuvastatin (CRESTOR) 40 MG tablet     Wynonia Musty [facility] Short Stay Center/Anesthesiology Phone [phone] 08/12/2018 1:13 PM

## 2018-08-15 ENCOUNTER — Inpatient Hospital Stay (HOSPITAL_COMMUNITY)
Admission: RE | Admit: 2018-08-15 | Discharge: 2018-08-17 | DRG: 460 | Disposition: A | Discharge status: Home Health | Payer: Medicare Other | Source: Ambulatory Visit | Attending: Neurosurgery | Admitting: Neurosurgery

## 2018-08-15 ENCOUNTER — Encounter (HOSPITAL_COMMUNITY): Payer: Self-pay | Admitting: Surgery

## 2018-08-15 ENCOUNTER — Other Ambulatory Visit: Payer: Self-pay

## 2018-08-15 ENCOUNTER — Inpatient Hospital Stay (HOSPITAL_COMMUNITY)
Admission: RE | Disposition: A | Discharge status: Home Health | Payer: Self-pay | Source: Ambulatory Visit | Attending: Neurosurgery

## 2018-08-15 ENCOUNTER — Inpatient Hospital Stay (HOSPITAL_COMMUNITY): Payer: Medicare Other | Admitting: Emergency Medicine

## 2018-08-15 ENCOUNTER — Inpatient Hospital Stay (HOSPITAL_COMMUNITY): Payer: Medicare Other

## 2018-08-15 DIAGNOSIS — R402363 Coma scale, best motor response, obeys commands, at hospital admission: Secondary | ICD-10-CM | POA: Diagnosis present

## 2018-08-15 DIAGNOSIS — I441 Atrioventricular block, second degree: Secondary | ICD-10-CM | POA: Diagnosis present

## 2018-08-15 DIAGNOSIS — M109 Gout, unspecified: Secondary | ICD-10-CM | POA: Diagnosis present

## 2018-08-15 DIAGNOSIS — Z950 Presence of cardiac pacemaker: Secondary | ICD-10-CM

## 2018-08-15 DIAGNOSIS — I129 Hypertensive chronic kidney disease with stage 1 through stage 4 chronic kidney disease, or unspecified chronic kidney disease: Secondary | ICD-10-CM | POA: Diagnosis present

## 2018-08-15 DIAGNOSIS — F17290 Nicotine dependence, other tobacco product, uncomplicated: Secondary | ICD-10-CM | POA: Diagnosis present

## 2018-08-15 DIAGNOSIS — M1990 Unspecified osteoarthritis, unspecified site: Secondary | ICD-10-CM | POA: Diagnosis present

## 2018-08-15 DIAGNOSIS — I4510 Unspecified right bundle-branch block: Secondary | ICD-10-CM | POA: Diagnosis present

## 2018-08-15 DIAGNOSIS — Z79899 Other long term (current) drug therapy: Secondary | ICD-10-CM | POA: Diagnosis not present

## 2018-08-15 DIAGNOSIS — M48062 Spinal stenosis, lumbar region with neurogenic claudication: Secondary | ICD-10-CM | POA: Diagnosis present

## 2018-08-15 DIAGNOSIS — R402143 Coma scale, eyes open, spontaneous, at hospital admission: Secondary | ICD-10-CM | POA: Diagnosis present

## 2018-08-15 DIAGNOSIS — M7130 Other bursal cyst, unspecified site: Secondary | ICD-10-CM | POA: Diagnosis present

## 2018-08-15 DIAGNOSIS — Z419 Encounter for procedure for purposes other than remedying health state, unspecified: Secondary | ICD-10-CM

## 2018-08-15 DIAGNOSIS — R402253 Coma scale, best verbal response, oriented, at hospital admission: Secondary | ICD-10-CM | POA: Diagnosis present

## 2018-08-15 DIAGNOSIS — D631 Anemia in chronic kidney disease: Secondary | ICD-10-CM | POA: Diagnosis present

## 2018-08-15 DIAGNOSIS — M4316 Spondylolisthesis, lumbar region: Secondary | ICD-10-CM | POA: Diagnosis present

## 2018-08-15 DIAGNOSIS — N183 Chronic kidney disease, stage 3 (moderate): Secondary | ICD-10-CM | POA: Diagnosis present

## 2018-08-15 DIAGNOSIS — E785 Hyperlipidemia, unspecified: Secondary | ICD-10-CM | POA: Diagnosis present

## 2018-08-15 DIAGNOSIS — M4690 Unspecified inflammatory spondylopathy, site unspecified: Secondary | ICD-10-CM | POA: Diagnosis present

## 2018-08-15 DIAGNOSIS — Z7982 Long term (current) use of aspirin: Secondary | ICD-10-CM

## 2018-08-15 DIAGNOSIS — G709 Myoneural disorder, unspecified: Secondary | ICD-10-CM | POA: Diagnosis present

## 2018-08-15 DIAGNOSIS — H9190 Unspecified hearing loss, unspecified ear: Secondary | ICD-10-CM | POA: Diagnosis present

## 2018-08-15 DIAGNOSIS — Z794 Long term (current) use of insulin: Secondary | ICD-10-CM

## 2018-08-15 DIAGNOSIS — Z792 Long term (current) use of antibiotics: Secondary | ICD-10-CM | POA: Diagnosis not present

## 2018-08-15 DIAGNOSIS — E1122 Type 2 diabetes mellitus with diabetic chronic kidney disease: Secondary | ICD-10-CM | POA: Diagnosis present

## 2018-08-15 HISTORY — DX: Spondylolisthesis, site unspecified: M43.10

## 2018-08-15 LAB — GLUCOSE, CAPILLARY
GLUCOSE-CAPILLARY: 186 mg/dL — AB (ref 70–99)
Glucose-Capillary: 122 mg/dL — ABNORMAL HIGH (ref 70–99)
Glucose-Capillary: 108 mg/dL — ABNORMAL HIGH (ref 70–99)
Glucose-Capillary: 101 mg/dL — ABNORMAL HIGH (ref 70–99)
Glucose-Capillary: 138 mg/dL — ABNORMAL HIGH (ref 70–99)

## 2018-08-15 LAB — CBC
HEMATOCRIT: 41.3 % (ref 39.0–52.0)
Hemoglobin: 12.3 g/dL — ABNORMAL LOW (ref 13.0–17.0)
MCH: 26.3 pg (ref 26.0–34.0)
MCHC: 29.8 g/dL — AB (ref 30.0–36.0)
MCV: 88.2 fL (ref 80.0–100.0)
NRBC: 0 % (ref 0.0–0.2)
PLATELETS: 226 10*3/uL (ref 150–400)
RBC: 4.68 MIL/uL (ref 4.22–5.81)
RDW: 15 % (ref 11.5–15.5)
WBC: 8.5 10*3/uL (ref 4.0–10.5)

## 2018-08-15 LAB — TYPE AND SCREEN
ABO/RH(D): B POS
Antibody Screen: NEGATIVE

## 2018-08-15 SURGERY — POSTERIOR LUMBAR FUSION 1 LEVEL
Anesthesia: General | Site: Back

## 2018-08-15 MED ORDER — OXYCODONE HCL 5 MG PO TABS
10.0000 mg | ORAL_TABLET | ORAL | Status: DC | PRN
  Administered 2018-08-15 – 2018-08-17 (×5): 10 mg via ORAL
  Filled 2018-08-15 (×5): qty 2

## 2018-08-15 MED ORDER — VANCOMYCIN HCL 1000 MG IV SOLR
INTRAVENOUS | Status: AC
  Filled 2018-08-15: qty 1000

## 2018-08-15 MED ORDER — INSULIN ASPART 100 UNIT/ML ~~LOC~~ SOLN
4.0000 [IU] | Freq: Three times a day (TID) | SUBCUTANEOUS | Status: DC
  Administered 2018-08-16 – 2018-08-17 (×3): 4 [IU] via SUBCUTANEOUS

## 2018-08-15 MED ORDER — OXYCODONE HCL 5 MG PO TABS
ORAL_TABLET | ORAL | Status: AC
  Filled 2018-08-15: qty 1

## 2018-08-15 MED ORDER — ALUM & MAG HYDROXIDE-SIMETH 200-200-20 MG/5ML PO SUSP: 30.0000 mL | Freq: Four times a day (QID) | ORAL | Status: DC | PRN

## 2018-08-15 MED ORDER — SODIUM CHLORIDE 0.9% FLUSH: 3.0000 mL | INTRAVENOUS | Status: DC | PRN

## 2018-08-15 MED ORDER — ACETAMINOPHEN 500 MG PO TABS: 1000.0000 mg | ORAL_TABLET | Freq: Four times a day (QID) | ORAL | Status: DC | PRN

## 2018-08-15 MED ORDER — ONDANSETRON HCL 4 MG/2ML IJ SOLN
INTRAMUSCULAR | Status: DC | PRN
  Administered 2018-08-15: 4 mg via INTRAVENOUS

## 2018-08-15 MED ORDER — LIDOCAINE-EPINEPHRINE 1 %-1:100000 IJ SOLN
INTRAMUSCULAR | Status: AC
  Filled 2018-08-15: qty 1

## 2018-08-15 MED ORDER — MINOCYCLINE HCL 100 MG PO CAPS
100.0000 mg | ORAL_CAPSULE | Freq: Every day | ORAL | Status: DC
  Administered 2018-08-16 – 2018-08-17 (×2): 100 mg via ORAL
  Filled 2018-08-15 (×3): qty 1

## 2018-08-15 MED ORDER — BUPIVACAINE HCL (PF) 0.25 % IJ SOLN
INTRAMUSCULAR | Status: AC
  Filled 2018-08-15: qty 30

## 2018-08-15 MED ORDER — SODIUM CHLORIDE 0.9 % IV SOLN
INTRAVENOUS | Status: DC
  Administered 2018-08-15 (×2): via INTRAVENOUS

## 2018-08-15 MED ORDER — ACETAMINOPHEN 500 MG PO TABS: 1000.0000 mg | ORAL_TABLET | Freq: Once | ORAL | Status: DC | PRN

## 2018-08-15 MED ORDER — VANCOMYCIN HCL 1 G IV SOLR
INTRAVENOUS | Status: DC | PRN
  Administered 2018-08-15: 1000 mg

## 2018-08-15 MED ORDER — MENTHOL 3 MG MT LOZG: 1.0000 | LOZENGE | OROMUCOSAL | Status: DC | PRN

## 2018-08-15 MED ORDER — ROCURONIUM BROMIDE 100 MG/10ML IV SOLN
INTRAVENOUS | Status: DC | PRN
  Administered 2018-08-15: 50 mg via INTRAVENOUS

## 2018-08-15 MED ORDER — HYDROCHLOROTHIAZIDE 25 MG PO TABS
25.0000 mg | ORAL_TABLET | Freq: Every day | ORAL | Status: DC
  Administered 2018-08-15 – 2018-08-17 (×3): 25 mg via ORAL
  Filled 2018-08-15 (×3): qty 1

## 2018-08-15 MED ORDER — THROMBIN (RECOMBINANT) 20000 UNITS EX SOLR
CUTANEOUS | Status: AC
  Filled 2018-08-15: qty 20000

## 2018-08-15 MED ORDER — PHENOL 1.4 % MT LIQD: 1.0000 | OROMUCOSAL | Status: DC | PRN

## 2018-08-15 MED ORDER — ONDANSETRON HCL 4 MG PO TABS: 4.0000 mg | ORAL_TABLET | Freq: Four times a day (QID) | ORAL | Status: DC | PRN

## 2018-08-15 MED ORDER — CYCLOBENZAPRINE HCL 10 MG PO TABS
10.0000 mg | ORAL_TABLET | Freq: Three times a day (TID) | ORAL | Status: DC | PRN
  Administered 2018-08-15 – 2018-08-17 (×4): 10 mg via ORAL
  Filled 2018-08-15 (×3): qty 1

## 2018-08-15 MED ORDER — 0.9 % SODIUM CHLORIDE (POUR BTL) OPTIME
TOPICAL | Status: DC | PRN
  Administered 2018-08-15: 1000 mL

## 2018-08-15 MED ORDER — CEFAZOLIN SODIUM 1 G IJ SOLR
INTRAMUSCULAR | Status: AC
  Filled 2018-08-15: qty 20

## 2018-08-15 MED ORDER — OXYCODONE HCL 5 MG PO TABS
5.0000 mg | ORAL_TABLET | Freq: Once | ORAL | Status: AC | PRN
  Administered 2018-08-15: 5 mg via ORAL

## 2018-08-15 MED ORDER — BUPIVACAINE LIPOSOME 1.3 % IJ SUSP
20.0000 mL | Freq: Once | INTRAMUSCULAR | Status: DC
  Filled 2018-08-15: qty 20

## 2018-08-15 MED ORDER — FENTANYL CITRATE (PF) 100 MCG/2ML IJ SOLN: 25.0000 ug | INTRAMUSCULAR | Status: DC | PRN

## 2018-08-15 MED ORDER — ROCURONIUM BROMIDE 50 MG/5ML IV SOSY
PREFILLED_SYRINGE | INTRAVENOUS | Status: AC
  Filled 2018-08-15: qty 5

## 2018-08-15 MED ORDER — LIDOCAINE 2% (20 MG/ML) 5 ML SYRINGE
INTRAMUSCULAR | Status: DC | PRN
  Administered 2018-08-15: 60 mg via INTRAVENOUS

## 2018-08-15 MED ORDER — THROMBIN 20000 UNITS EX SOLR
CUTANEOUS | Status: DC | PRN
  Administered 2018-08-15: 20 mL via TOPICAL

## 2018-08-15 MED ORDER — ACETAMINOPHEN 325 MG PO TABS
650.0000 mg | ORAL_TABLET | ORAL | Status: DC | PRN
  Administered 2018-08-15 – 2018-08-16 (×3): 650 mg via ORAL
  Filled 2018-08-15 (×3): qty 2

## 2018-08-15 MED ORDER — INSULIN ASPART 100 UNIT/ML ~~LOC~~ SOLN
0.0000 [IU] | Freq: Three times a day (TID) | SUBCUTANEOUS | Status: DC
  Administered 2018-08-16: 4 [IU] via SUBCUTANEOUS
  Administered 2018-08-16 – 2018-08-17 (×2): 7 [IU] via SUBCUTANEOUS

## 2018-08-15 MED ORDER — SODIUM CHLORIDE 0.9 % IV SOLN
INTRAVENOUS | Status: DC | PRN
  Administered 2018-08-15: 500 mL

## 2018-08-15 MED ORDER — FENTANYL CITRATE (PF) 100 MCG/2ML IJ SOLN
INTRAMUSCULAR | Status: DC | PRN
  Administered 2018-08-15 (×2): 50 ug via INTRAVENOUS
  Administered 2018-08-15: 100 ug via INTRAVENOUS
  Administered 2018-08-15: 50 ug via INTRAVENOUS

## 2018-08-15 MED ORDER — BUPIVACAINE LIPOSOME 1.3 % IJ SUSP
INTRAMUSCULAR | Status: DC | PRN
  Administered 2018-08-15: 20 mL

## 2018-08-15 MED ORDER — DIPHENHYDRAMINE HCL 50 MG/ML IJ SOLN
INTRAMUSCULAR | Status: AC
  Filled 2018-08-15: qty 1

## 2018-08-15 MED ORDER — ACETAMINOPHEN 160 MG/5ML PO SOLN: 1000.0000 mg | Freq: Once | ORAL | Status: DC | PRN

## 2018-08-15 MED ORDER — DIPHENHYDRAMINE HCL 50 MG/ML IJ SOLN
25.0000 mg | Freq: Once | INTRAMUSCULAR | Status: AC
  Administered 2018-08-15: 25 mg via INTRAVENOUS

## 2018-08-15 MED ORDER — PROPOFOL 10 MG/ML IV BOLUS
INTRAVENOUS | Status: DC | PRN
  Administered 2018-08-15: 20 mg via INTRAVENOUS
  Administered 2018-08-15: 30 mg via INTRAVENOUS
  Administered 2018-08-15: 100 mg via INTRAVENOUS

## 2018-08-15 MED ORDER — ONDANSETRON HCL 4 MG/2ML IJ SOLN: 4.0000 mg | Freq: Four times a day (QID) | INTRAMUSCULAR | Status: DC | PRN

## 2018-08-15 MED ORDER — ONDANSETRON HCL 4 MG/2ML IJ SOLN
INTRAMUSCULAR | Status: AC
  Filled 2018-08-15: qty 2

## 2018-08-15 MED ORDER — ACETAMINOPHEN 10 MG/ML IV SOLN: 1000.0000 mg | Freq: Once | INTRAVENOUS | Status: DC | PRN

## 2018-08-15 MED ORDER — PANTOPRAZOLE SODIUM 40 MG PO TBEC
40.0000 mg | DELAYED_RELEASE_TABLET | Freq: Every day | ORAL | Status: DC
  Administered 2018-08-15 – 2018-08-17 (×3): 40 mg via ORAL
  Filled 2018-08-15 (×3): qty 1

## 2018-08-15 MED ORDER — CEFAZOLIN SODIUM-DEXTROSE 2-4 GM/100ML-% IV SOLN
2.0000 g | Freq: Two times a day (BID) | INTRAVENOUS | Status: AC
  Administered 2018-08-15 – 2018-08-17 (×4): 2 g via INTRAVENOUS
  Filled 2018-08-15 (×4): qty 100

## 2018-08-15 MED ORDER — SODIUM CHLORIDE 0.9 % IV SOLN: 250.0000 mL | INTRAVENOUS | Status: DC

## 2018-08-15 MED ORDER — LIDOCAINE-EPINEPHRINE 1 %-1:100000 IJ SOLN
INTRAMUSCULAR | Status: DC | PRN
  Administered 2018-08-15: 7 mL

## 2018-08-15 MED ORDER — AMLODIPINE BESYLATE 5 MG PO TABS
10.0000 mg | ORAL_TABLET | Freq: Every day | ORAL | Status: DC
  Administered 2018-08-15 – 2018-08-17 (×3): 10 mg via ORAL
  Filled 2018-08-15 (×3): qty 2

## 2018-08-15 MED ORDER — PHENYLEPHRINE 40 MCG/ML (10ML) SYRINGE FOR IV PUSH (FOR BLOOD PRESSURE SUPPORT)
PREFILLED_SYRINGE | INTRAVENOUS | Status: DC | PRN
  Administered 2018-08-15 (×2): 80 ug via INTRAVENOUS

## 2018-08-15 MED ORDER — CEFAZOLIN SODIUM-DEXTROSE 2-3 GM-%(50ML) IV SOLR
INTRAVENOUS | Status: DC | PRN
  Administered 2018-08-15: 2 g via INTRAVENOUS

## 2018-08-15 MED ORDER — ROSUVASTATIN CALCIUM 20 MG PO TABS
40.0000 mg | ORAL_TABLET | Freq: Every day | ORAL | Status: DC
  Administered 2018-08-15 – 2018-08-17 (×3): 40 mg via ORAL
  Filled 2018-08-15 (×3): qty 2

## 2018-08-15 MED ORDER — ACETAMINOPHEN 650 MG RE SUPP: 650.0000 mg | RECTAL | Status: DC | PRN

## 2018-08-15 MED ORDER — ASPIRIN EC 81 MG PO TBEC
81.0000 mg | DELAYED_RELEASE_TABLET | Freq: Every day | ORAL | Status: DC
  Administered 2018-08-16 – 2018-08-17 (×2): 81 mg via ORAL
  Filled 2018-08-15 (×2): qty 1

## 2018-08-15 MED ORDER — CYCLOBENZAPRINE HCL 10 MG PO TABS
ORAL_TABLET | ORAL | Status: AC
  Filled 2018-08-15: qty 1

## 2018-08-15 MED ORDER — ALLOPURINOL 100 MG PO TABS
100.0000 mg | ORAL_TABLET | Freq: Every day | ORAL | Status: DC
  Administered 2018-08-16 – 2018-08-17 (×2): 100 mg via ORAL
  Filled 2018-08-15 (×3): qty 1

## 2018-08-15 MED ORDER — FENTANYL CITRATE (PF) 250 MCG/5ML IJ SOLN
INTRAMUSCULAR | Status: AC
  Filled 2018-08-15: qty 5

## 2018-08-15 MED ORDER — HYDROMORPHONE HCL 1 MG/ML IJ SOLN
0.5000 mg | INTRAMUSCULAR | Status: DC | PRN
  Administered 2018-08-15: 0.5 mg via INTRAVENOUS
  Filled 2018-08-15: qty 0.5

## 2018-08-15 MED ORDER — THROMBIN 5000 UNITS EX SOLR
CUTANEOUS | Status: AC
  Filled 2018-08-15: qty 5000

## 2018-08-15 MED ORDER — SODIUM CHLORIDE 0.9% FLUSH
3.0000 mL | Freq: Two times a day (BID) | INTRAVENOUS | Status: DC
  Administered 2018-08-16 – 2018-08-17 (×2): 3 mL via INTRAVENOUS

## 2018-08-15 MED ORDER — SUGAMMADEX SODIUM 200 MG/2ML IV SOLN
INTRAVENOUS | Status: DC | PRN
  Administered 2018-08-15: 200 mg via INTRAVENOUS

## 2018-08-15 MED ORDER — OXYCODONE HCL 5 MG/5ML PO SOLN: 5.0000 mg | Freq: Once | ORAL | Status: AC | PRN

## 2018-08-15 MED ORDER — LIDOCAINE 2% (20 MG/ML) 5 ML SYRINGE
INTRAMUSCULAR | Status: AC
  Filled 2018-08-15: qty 5

## 2018-08-15 MED ORDER — SODIUM CHLORIDE 0.9 % IV SOLN
INTRAVENOUS | Status: DC | PRN
  Administered 2018-08-15: 25 ug/min via INTRAVENOUS

## 2018-08-15 SURGICAL SUPPLY — 92 items
ADH SKN CLS APL DERMABOND .7 (GAUZE/BANDAGES/DRESSINGS) ×1
APL SKNCLS STERI-STRIP NONHPOA (GAUZE/BANDAGES/DRESSINGS) ×1
BAG DECANTER FOR FLEXI CONT (MISCELLANEOUS) ×3 IMPLANT
BASKET BONE COLLECTION (BASKET) ×2 IMPLANT
BENZOIN TINCTURE PRP APPL 2/3 (GAUZE/BANDAGES/DRESSINGS) ×3 IMPLANT
BIT DRILL 5.0/4.0 (BIT) IMPLANT
BLADE CLIPPER SURG (BLADE) ×2 IMPLANT
BLADE SURG 11 STRL SS (BLADE) ×3 IMPLANT
BONE VIVIGEN FORMABLE 5.4CC (Bone Implant) ×3 IMPLANT
BUR CUTTER 7.0 ROUND (BURR) ×3 IMPLANT
BUR MATCHSTICK NEURO 3.0 LAGG (BURR) ×3 IMPLANT
CAGE RISE 11-17-15 10X26 (Cage) ×4 IMPLANT
CANISTER SUCT 3000ML PPV (MISCELLANEOUS) ×3 IMPLANT
CAP LOCKING (Cap) ×8 IMPLANT
CAP LOCKING 5.5 CREO (Cap) IMPLANT
CARTRIDGE OIL MAESTRO DRILL (MISCELLANEOUS) ×1 IMPLANT
CLOSURE WOUND 1/2 X4 (GAUZE/BANDAGES/DRESSINGS) ×1
CONT SPEC 4OZ CLIKSEAL STRL BL (MISCELLANEOUS) ×3 IMPLANT
COVER BACK TABLE 60X90IN (DRAPES) ×3 IMPLANT
COVER WAND RF STERILE (DRAPES) ×1 IMPLANT
DECANTER SPIKE VIAL GLASS SM (MISCELLANEOUS) ×3 IMPLANT
DERMABOND ADVANCED (GAUZE/BANDAGES/DRESSINGS) ×2
DERMABOND ADVANCED .7 DNX12 (GAUZE/BANDAGES/DRESSINGS) ×1 IMPLANT
DIFFUSER DRILL AIR PNEUMATIC (MISCELLANEOUS) ×3 IMPLANT
DRAPE C-ARM 42X72 X-RAY (DRAPES) ×6 IMPLANT
DRAPE C-ARMOR (DRAPES) ×2 IMPLANT
DRAPE HALF SHEET 40X57 (DRAPES) ×2 IMPLANT
DRAPE LAPAROTOMY 100X72X124 (DRAPES) ×3 IMPLANT
DRAPE SURG 17X23 STRL (DRAPES) ×3 IMPLANT
DRILL 5.0/4.0 (BIT) ×3
DRSG OPSITE 4X5.5 SM (GAUZE/BANDAGES/DRESSINGS) ×3 IMPLANT
DRSG OPSITE POSTOP 4X6 (GAUZE/BANDAGES/DRESSINGS) ×3 IMPLANT
DURAPREP 26ML APPLICATOR (WOUND CARE) ×3 IMPLANT
ELECT BLADE 4.0 EZ CLEAN MEGAD (MISCELLANEOUS) ×3
ELECT REM PT RETURN 9FT ADLT (ELECTROSURGICAL) ×3
ELECTRODE BLDE 4.0 EZ CLN MEGD (MISCELLANEOUS) IMPLANT
ELECTRODE REM PT RTRN 9FT ADLT (ELECTROSURGICAL) ×1 IMPLANT
EVACUATOR 1/8 PVC DRAIN (DRAIN) ×2 IMPLANT
EVACUATOR 3/16  PVC DRAIN (DRAIN)
EVACUATOR 3/16 PVC DRAIN (DRAIN) IMPLANT
GAUZE 4X4 16PLY RFD (DISPOSABLE) IMPLANT
GAUZE SPONGE 4X4 12PLY STRL (GAUZE/BANDAGES/DRESSINGS) ×3 IMPLANT
GLOVE BIO SURGEON STRL SZ7 (GLOVE) ×2 IMPLANT
GLOVE BIO SURGEON STRL SZ7.5 (GLOVE) ×8 IMPLANT
GLOVE BIO SURGEON STRL SZ8 (GLOVE) ×6 IMPLANT
GLOVE BIOGEL PI IND STRL 6.5 (GLOVE) IMPLANT
GLOVE BIOGEL PI IND STRL 7.0 (GLOVE) IMPLANT
GLOVE BIOGEL PI IND STRL 7.5 (GLOVE) IMPLANT
GLOVE BIOGEL PI INDICATOR 6.5 (GLOVE) ×4
GLOVE BIOGEL PI INDICATOR 7.0 (GLOVE) ×6
GLOVE BIOGEL PI INDICATOR 7.5 (GLOVE) ×4
GLOVE ECLIPSE 7.5 STRL STRAW (GLOVE) IMPLANT
GLOVE EXAM NITRILE LRG STRL (GLOVE) IMPLANT
GLOVE EXAM NITRILE XL STR (GLOVE) IMPLANT
GLOVE EXAM NITRILE XS STR PU (GLOVE) IMPLANT
GLOVE INDICATOR 8.5 STRL (GLOVE) ×6 IMPLANT
GLOVE SS BIOGEL STRL SZ 6.5 (GLOVE) IMPLANT
GLOVE SUPERSENSE BIOGEL SZ 6.5 (GLOVE) ×6
GLOVE SURG SS PI 6.0 STRL IVOR (GLOVE) ×4 IMPLANT
GOWN STRL REUS W/ TWL LRG LVL3 (GOWN DISPOSABLE) IMPLANT
GOWN STRL REUS W/ TWL XL LVL3 (GOWN DISPOSABLE) ×2 IMPLANT
GOWN STRL REUS W/TWL 2XL LVL3 (GOWN DISPOSABLE) IMPLANT
GOWN STRL REUS W/TWL LRG LVL3 (GOWN DISPOSABLE) ×15
GOWN STRL REUS W/TWL XL LVL3 (GOWN DISPOSABLE) ×6
GRAFT BNE MATRIX VG FRMBL MD 5 (Bone Implant) IMPLANT
HEMOSTAT POWDER KIT SURGIFOAM (HEMOSTASIS) IMPLANT
KIT BASIN OR (CUSTOM PROCEDURE TRAY) ×3 IMPLANT
KIT TURNOVER KIT B (KITS) ×3 IMPLANT
MILL MEDIUM DISP (BLADE) ×3 IMPLANT
NDL HYPO 21X1.5 SAFETY (NEEDLE) IMPLANT
NDL HYPO 25X1 1.5 SAFETY (NEEDLE) ×1 IMPLANT
NEEDLE HYPO 21X1.5 SAFETY (NEEDLE) ×3 IMPLANT
NEEDLE HYPO 25X1 1.5 SAFETY (NEEDLE) ×3 IMPLANT
NS IRRIG 1000ML POUR BTL (IV SOLUTION) ×5 IMPLANT
OIL CARTRIDGE MAESTRO DRILL (MISCELLANEOUS) ×3
PACK LAMINECTOMY NEURO (CUSTOM PROCEDURE TRAY) ×3 IMPLANT
PAD ARMBOARD 7.5X6 YLW CONV (MISCELLANEOUS) ×9 IMPLANT
ROD SPINAL 35MM (Rod) ×4 IMPLANT
SHAFT CREO 30MM (Neuro Prosthesis/Implant) ×8 IMPLANT
SPONGE LAP 4X18 RFD (DISPOSABLE) IMPLANT
SPONGE SURGIFOAM ABS GEL 100 (HEMOSTASIS) ×3 IMPLANT
STRIP CLOSURE SKIN 1/2X4 (GAUZE/BANDAGES/DRESSINGS) ×3 IMPLANT
SUT VIC AB 0 CT1 18XCR BRD8 (SUTURE) ×2 IMPLANT
SUT VIC AB 0 CT1 8-18 (SUTURE) ×3
SUT VIC AB 2-0 CT1 18 (SUTURE) ×3 IMPLANT
SUT VIC AB 4-0 PS2 27 (SUTURE) ×3 IMPLANT
SYRINGE 20CC LL (MISCELLANEOUS) ×2 IMPLANT
TOWEL GREEN STERILE (TOWEL DISPOSABLE) ×3 IMPLANT
TOWEL GREEN STERILE FF (TOWEL DISPOSABLE) ×3 IMPLANT
TRAY FOLEY MTR SLVR 16FR STAT (SET/KITS/TRAYS/PACK) ×3 IMPLANT
TULIP CREP AMP 5.5MM (Orthopedic Implant) ×8 IMPLANT
WATER STERILE IRR 1000ML POUR (IV SOLUTION) ×3 IMPLANT

## 2018-08-15 NOTE — Transfer of Care (Signed)
Immediate Anesthesia Transfer of Care Note  Patient: Keith Drake  Procedure(s) Performed: POSTERIOR LUMBAR INTERBODY FUSION - LUMBAR FOUR-LUMBAR FIVE (N/A Back)  Patient Location: PACU  Anesthesia Type:General  Level of Consciousness: drowsy  Airway & Oxygen Therapy: Patient Spontanous Breathing and Patient connected to face mask oxygen  Post-op Assessment: Report given to RN, Post -op Vital signs reviewed and stable and Patient moving all extremities X 4  Post vital signs: Reviewed and stable  Last Vitals:  Vitals Value Taken Time  BP 138/84 08/15/2018  3:52 PM  Temp    Pulse 70 08/15/2018  3:53 PM  Resp 16 08/15/2018  3:53 PM  SpO2 100 % 08/15/2018  3:53 PM  Vitals shown include unvalidated device data.  Last Pain:  Vitals:   08/15/18 0956  TempSrc:   PainSc: 0-No pain      Patients Stated Pain Goal: 3 (67/54/49 2010)  Complications: No apparent anesthesia complications

## 2018-08-15 NOTE — Op Note (Signed)
Preoperative diagnosis: Grade 1 spondylolisthesis L4-5 with lumbar spinal stenosis and neurogenic location  Postoperative diagnosis: Same  Procedure: #1 complete decompressive lumbar laminectomy L4-5 with complete medial facetectomies radical foraminotomies of the L4 and L5 nerve roots bilaterally in excess and requiring more work to would be needed with a standard interbody fusion  #2 posterior lumbar interbody fusion L4-5 utilizing the globus rise expandable cage systemwith cages packed with locally harvested autograft mixed with vivigen  #3vertical screw fixation L4-5 utilizing the globus Creole and cortical screw set  Surgeon: Dominica Severin Riad Wagley  Asst.: Margo Aye  Anesthesia: Gen.  EBL: Minimal  History of present illness: 67 year oldgentleman with long-standing back pain and neurogenic claudication workup revealed a grade 1 spondylolisthesis with severe thecal sac compression by foraminal stenosis the L4 and L5 nerve roots. Due to patient's failure conservative treatment imaging findings and progressive clinical syndrome or recommended decompression stabilization procedure at L4-5. I extensively went over the risks and benefits of the operation with him as well as perioperative course expectations of outcome and alternatives surgery and he understood and agreed to proceed 4.  Operative procedure: Patient brought into the or was induced under general anesthesia positioned prone the Wilson frame his back was prepped and draped in routine sterile fashion preoperative localizing appropriate level so after infiltration of 7 mL of lidocaine with epi a midline incision was made and Bovie light cautery was used to take down the subcutaneous tissues and subperiosteal dissections care lamina of L4 and L5 confirmed by interoperative x-ray. So then there was a large degenerated synovial cyst and facet joints coming off the L4-5 facet this is all removed facets were drilled down with a high-speed drill spinous  process was removed decompress central canal as central stenosis was is much a problem for him as foraminal stenosis. Complete medial fasciectomies were performed a radical from his the L4 and L5 nerve root and thecal sac was noted be markedly compressed hourglass compression bite hypertrophied ligament and marked facet arthropathy. This is all removed decompressing the thecal sac and both L4 and L5 nerve roots were identified and skeletonized flush with pedicle. Then under under fluoroscopic guidance disc spaces cleanout bilaterally sequential distraction with an 11 Distractor Pl. Allowed [place] to clean and prepare the endplates removed all disc and decompress the extra foraminal space. After adequate endplate preparation been achieved I selected 10-17 expandable cages packed with locally harvested autograft mixed and inserted sequentially. Packed local autograft mix centrally between the 2 cages and after both implants been inserted and expanded and fluoroscopy confirmed good position attention second the cortical screw placement. Utilizing high-speed drill 4 cortical screws are placed all screws excellent purchase, posterior fluoroscopy confirmed good position of all the implants.then the wound scope was irrigated meticulous he states was maintained a inspected the foramina to confirm patency laid Gelfoam and top of the dura assembled the heads of the on the cortical sacs. Drilled the screws and a little bit more Place 2 rods anchored evening in place cervical vancomycin powder in the wound placed a medium Hemovac drain injected X Perrone the fascia and closed the wound in layers with after Vicryl and a running 4 subcuticular Dermabond benzo and Steri-Strips and a sterile dressing was applied patient recovered in stable condition. At the end the case all needle counts sponge counts were correct.

## 2018-08-15 NOTE — Anesthesia Procedure Notes (Signed)
Procedure Name: Intubation Date/Time: 08/15/2018 12:37 PM Performed by: Myna Bright, CRNA Pre-anesthesia Checklist: Patient identified, Emergency Drugs available, Suction available and Patient being monitored Patient Re-evaluated:Patient Re-evaluated prior to induction Oxygen Delivery Method: Circle system utilized Preoxygenation: Pre-oxygenation with 100% oxygen Induction Type: IV induction Ventilation: Mask ventilation without difficulty Laryngoscope Size: Mac and 3 Grade View: Grade II Tube type: Oral Tube size: 7.5 mm Number of attempts: 1 Airway Equipment and Method: Stylet Placement Confirmation: ETT inserted through vocal cords under direct vision,  positive ETCO2 and breath sounds checked- equal and bilateral Secured at: 22 cm Tube secured with: Tape Dental Injury: Teeth and Oropharynx as per pre-operative assessment

## 2018-08-15 NOTE — H&P (Signed)
Keith Drake is an 83 y.o. male.   Chief Complaint: back pain and bilateral leg pain and neurogenic claudication HPI: 83 year old gentleman with severe back pain and bilateral leg pain and neurogenic claudication. Workup revealed severe spinal stenosis spondylolisthesis L4-5. Due to patient's failure conservative treatment imaging findings and progressive clinical syndrome was recommended decompression stabilization procedure at L4-5. We extensively went over the risks and benefits of the operation with him as well as perioperative course expectations of outcome and alternatives of surgery and he understood and agreed to proceed forward.  Past Medical History:  Diagnosis Date  . Anemia   . Arthritis    OCC LEG PAIN  . Bladder cancer ([place]) TCC   History of bladder carcinoma; S/P BCG TX'S ; "never treated for cancer; it was a spot on my bladder" (07/20/2018)  . CKD (chronic kidney disease), stage III ([place])   . History of gout    "on daily RX" (07/20/2018)  . History of kidney stones   . Hyperlipidemia   . Hypertension   . Impaired hearing BILATERAL    OCCASIONAL WEARS HEARING AIDS  . Mobitz I    [name] 07/20/2018  . Pneumonia  ~ 1980s X 1  . Presence of permanent cardiac pacemaker 07/20/2018  . Right bundle branch block   . Spondylolisthesis   . Type II diabetes mellitus ([place])     Past Surgical History:  Procedure Laterality Date  . COLONOSCOPY  03/2004  . CYSTO / RESECTIONAL BLADDER BX'S  05-01-2011  . CYSTOSCOPY WITH BIOPSY  10/05/2011   Procedure: CYSTOSCOPY WITH BIOPSY;  Surgeon: Claybon Jabs, MD;  Location: [facility];  Service: Urology;  Laterality: N/A;  BLADDER BIOPSY   . CYSTOSCOPY WITH LITHOLAPAXY    . EXCISIONAL HEMORRHOIDECTOMY  1980s  . INSERT / REPLACE / REMOVE PACEMAKER  07/20/2018   Implantation of new dual chamber permanent pacemaker  . PACEMAKER IMPLANT N/A 07/20/2018   Procedure: PACEMAKER IMPLANT;  Surgeon: Sanda Klein, MD;  Location: [facility];  Service: Cardiovascular;  Laterality: N/A;  . REFRACTIVE SURGERY Bilateral   . TRANSURETHRAL RESECTION OF BLADDER TUMOR  03-02-2011  . US ECHOCARDIOGRAPHY  07-11-2009   EF 55-60%    Family History  Problem Relation Age of Onset  . Colon cancer Brother   . Cancer Brother   . Cancer Brother   . Diabetes Sister    Social History:  reports that he has been smoking cigars. He has a 3.00 pack-year smoking history. He has never used smokeless tobacco. He reports that he drank alcohol. He reports that he does not use drugs.  Allergies: No Known Allergies  Medications Prior to Admission  Medication Sig Dispense Refill  . acetaminophen (TYLENOL) 500 MG tablet Take 1,000 mg by mouth every 6 (six) hours as needed for mild pain.    Marland Kitchen allopurinol (ZYLOPRIM) 100 MG tablet Take 100 mg by mouth daily.  2  . amLODipine (NORVASC) 10 MG tablet Take 10 mg by mouth daily.   0  . aspirin 81 MG tablet Take 81 mg by mouth daily.     Marland Kitchen HUMULIN 70/30 (70-30) 100 UNIT/ML injection Inject 15-40 Units into the skin 2 (two) times daily with a meal. Inject 40 units with breakfast and 15 units with supper.  11  . hydrochlorothiazide (HYDRODIURIL) 25 MG tablet Take 25 mg by mouth daily.    . minocycline (MINOCIN,DYNACIN) 100 MG capsule Take 100 mg by mouth daily.     Marland Kitchen  rosuvastatin (CRESTOR) 40 MG tablet Take 40 mg by mouth daily.       Results for orders placed or performed during the hospital encounter of 08/15/18 (from the past 48 hour(s))  Glucose, capillary     Status: Abnormal   Collection Time: 08/15/18  9:42 AM  Result Value Ref Range   Glucose-Capillary 122 (H) 70 - 99 mg/dL   Comment 1 Notify RN    Comment 2 Document in Chart   CBC     Status: Abnormal   Collection Time: 08/15/18  9:56 AM  Result Value Ref Range   WBC 8.5 4.0 - 10.5 K/uL   RBC 4.68 4.22 - 5.81 MIL/uL   Hemoglobin 12.3 (L) 13.0 - 17.0 g/dL   HCT 41.3 39.0 - 52.0 %   MCV 88.2 80.0 - 100.0 fL   MCH 26.3 26.0 - 34.0  pg   MCHC 29.8 (L) 30.0 - 36.0 g/dL   RDW 15.0 11.5 - 15.5 %   Platelets 226 150 - 400 K/uL   nRBC 0.0 0.0 - 0.2 %    Comment: Performed at [facility], [place] [address], [place], [place] [address]  Type and screen [place]     Status: None   Collection Time: 08/15/18  9:56 AM  Result Value Ref Range   ABO/RH(D) B POS    Antibody Screen NEG    Sample Expiration      08/18/2018 Performed at [facility], [place] [address] [place], [place] [address]   Glucose, capillary     Status: Abnormal   Collection Time: 08/15/18 11:45 AM  Result Value Ref Range   Glucose-Capillary 108 (H) 70 - 99 mg/dL   Comment 1 Notify RN    Comment 2 Call MD NNP PA CNM    Comment 3 Document in Chart    No results found.  Review of Systems  Musculoskeletal: Positive for back pain.  Neurological: Positive for sensory change.    Blood pressure (!) 155/80, pulse 60, temperature 98 F (36.7 C), temperature source Oral, resp. rate 18, height 5\' 10"  (1.778 m), weight 90.7 kg, SpO2 100 %. Physical Exam  Constitutional: He is oriented to person, place, and time. He appears well-developed.  HENT:  Head: Normocephalic.  Eyes: Pupils are equal, round, and reactive to light.  Neck: Normal range of motion.  Cardiovascular: Normal rate.  Respiratory: Effort normal and breath sounds normal.  Musculoskeletal: Normal range of motion.  Neurological: He is alert and oriented to person, place, and time. He has normal strength. GCS eye subscore is 4. GCS verbal subscore is 5. GCS motor subscore is 6.  Strength out of 5 iliopsoas, quads, hamstrings, gastrocs, and tibialis, and EHL.  Skin: Skin is warm and dry.     Assessment/Plan 83 year old presents for decompression stabilization procedure at L4-5.  Vinson Tietze P, MD 08/15/2018, 12:12 PM

## 2018-08-15 NOTE — Anesthesia Preprocedure Evaluation (Signed)
Anesthesia Evaluation  Patient identified by MRN, date of birth, ID band Patient awake    Reviewed: Allergy & Precautions, NPO status , Patient's Chart, lab work & pertinent test results  History of Anesthesia Complications Negative for: history of anesthetic complications  Airway Mallampati: II  TM Distance: >3 FB Neck ROM: Full    Dental  (+) Poor Dentition   Pulmonary Current Smoker,    breath sounds clear to auscultation       Cardiovascular hypertension, + dysrhythmias + pacemaker  Rhythm:Regular     Neuro/Psych  Neuromuscular disease negative psych ROS   GI/Hepatic negative GI ROS,   Endo/Other  diabetes, Type 2, Insulin Dependent  Renal/GU CRFRenal disease     Musculoskeletal   Abdominal   Peds  Hematology  (+) anemia ,   Anesthesia Other Findings Pacemaker changed to DOO at 48  Reproductive/Obstetrics                             Anesthesia Physical Anesthesia Plan  ASA: III  Anesthesia Plan: General   Post-op Pain Management:    Induction: Intravenous  PONV Risk Score and Plan: 1 and Ondansetron and Dexamethasone  Airway Management Planned: Oral ETT  Additional Equipment: None  Intra-op Plan:   Post-operative Plan: Extubation in OR  Informed Consent: I have reviewed the patients History and Physical, chart, labs and discussed the procedure including the risks, benefits and alternatives for the proposed anesthesia with the patient or authorized representative who has indicated his/her understanding and acceptance.   Dental advisory given  Plan Discussed with: CRNA and Surgeon  Anesthesia Plan Comments:         Anesthesia Quick Evaluation

## 2018-08-16 LAB — GLUCOSE, CAPILLARY
GLUCOSE-CAPILLARY: 197 mg/dL — AB (ref 70–99)
Glucose-Capillary: 189 mg/dL — ABNORMAL HIGH (ref 70–99)
Glucose-Capillary: 210 mg/dL — ABNORMAL HIGH (ref 70–99)
Glucose-Capillary: 227 mg/dL — ABNORMAL HIGH (ref 70–99)

## 2018-08-16 MED ORDER — OXYCODONE HCL 10 MG PO TABS: 10.0000 mg | ORAL_TABLET | ORAL | 0 refills | Status: DC | PRN

## 2018-08-16 MED ORDER — INSULIN ASPART 100 UNIT/ML ~~LOC~~ SOLN
0.0000 [IU] | Freq: Every day | SUBCUTANEOUS | Status: DC
  Administered 2018-08-16: 2 [IU] via SUBCUTANEOUS

## 2018-08-16 MED FILL — Thrombin (Recombinant) For Soln 20000 Unit: CUTANEOUS | Qty: 1 | Status: AC

## 2018-08-16 NOTE — Progress Notes (Signed)
Patient unable to void. Bladder scan done 773ml. In and out cath done obtained 462ml  Clear yellow urine. Will monitor accordingly.

## 2018-08-16 NOTE — Evaluation (Signed)
Physical Therapy Evaluation Patient Details Name: Keith Drake MRN: [id number] DOB: [birth year] Today's Date: 08/16/2018   History of Present Illness  Pt is an 83 y/o male who presents s/p L4-L5 PLIF on 08/15/18. PMH significant for MII, R BBB, PPM implant 07/20/18, HTN, gout, CKD III, CA.  Clinical Impression  Pt admitted with above diagnosis. Pt currently with functional limitations due to the deficits listed below (see PT Problem List). At the time of PT eval pt was able to perform transfers and ambulation with up to min assist for balance support and safety with RW. Education was difficult as pt is very hard of hearing, and falling asleep sitting up at end of session. Pt will have a friend available to assist PRN however currently pt would benefit from 24 hour assistance, as he is at increased risk of falls and has difficulty maintaining precautions. Feel this patient would benefit from another PT/OT session tomorrow to reinforce precautions and ensure safety with OOB mobility and on stairs. Also hopeful that friend will be available for education tomorrow as well. Acutely, pt will benefit from skilled PT to increase their independence and safety with mobility to allow discharge to the venue listed below.     Follow Up Recommendations Home health PT;Supervision/Assistance - 24 hour    Equipment Recommendations  Rolling walker with 5" wheels    Recommendations for Other Services       Precautions / Restrictions Precautions Precautions: Back;Fall Precaution Booklet Issued: Yes (comment) Precaution Comments: Precaution sheet was reviewed with pt, and he was able to verbally recall 2/3 at end of session. Overall poor adherence during functional mobility.  Required Braces or Orthoses: Other Brace/Splint Other Brace/Splint: No brace per MD order; brace in room. Donned brace to increase adherance to precautions.  Restrictions Weight Bearing Restrictions: No      Mobility  Bed  Mobility Overal bed mobility: Needs Assistance Bed Mobility: Rolling;Sidelying to Sit Rolling: Min assist Sidelying to sit: Min assist       General bed mobility comments: Pt was received ambulating with OT in hall and was returned to the recliner at end of session.   Transfers Overall transfer level: Needs assistance Equipment used: Rolling walker (2 wheeled) Transfers: Sit to/from Stand Sit to Stand: Min assist;From elevated surface         General transfer comment: Assist for maintaining precautions as he backed up to chair. VC's for hand placement on seated surface for safety, and assist provided for controlled descent to recliner.   Ambulation/Gait Ambulation/Gait assistance: Min assist;Min guard Gait Distance (Feet): 120 Feet Assistive device: Rolling walker (2 wheeled) Gait Pattern/deviations: Decreased stride length;Shuffle;Trunk flexed;Narrow base of support Gait velocity: Decreased Gait velocity interpretation: 1.31 - 2.62 ft/sec, indicative of limited community ambulator General Gait Details: VC's for increased step/stride length, improved posture, and closer walker proximity. Pt able to make corrective changes but unable to maintain. Hands-on guarding provided throughout gait training however occasional min assist required for unsteadiness LOB  Stairs Stairs: Yes Stairs assistance: Min assist;+2 safety/equipment Stair Management: One rail Left;Sideways;Step to pattern Number of Stairs: 2 General stair comments: Instructed pt in sideways negotiation of stairs as he only has left railing at home. Pt was able to complete 2 stairs however with poor posture and maintenance of precautions and assistance for balance support  Wheelchair Mobility    Modified Rankin (Stroke Patients Only)       Balance Overall balance assessment: Needs assistance Sitting-balance support: No upper extremity supported;Feet supported Sitting balance-Leahy  Scale: Fair     Standing  balance support: Bilateral upper extremity supported;During functional activity Standing balance-[name]: Poor Standing balance comment: Reliant on UE support                             Pertinent Vitals/Pain Pain Assessment: Faces Faces Pain Scale: Hurts little more Pain Location: Back Pain Descriptors / Indicators: Discomfort;Grimacing Pain Intervention(s): Monitored during session    Home Living Family/patient expects to be discharged to:: Private residence Living Arrangements: Alone Available Help at Discharge: Friend(s);Available PRN/intermittently Type of Home: House Home Access: Stairs to enter Entrance Stairs-Rails: Left Entrance Stairs-Number of Steps: 2 Home Layout: One level Home Equipment: Walker - 4 wheels;Shower seat      Prior Function Level of Independence: Independent         Comments: Performed ADLs and IADLs including cooking, cleaning, and driving.     Hand Dominance        Extremity/Trunk Assessment   Upper Extremity Assessment Upper Extremity Assessment: Defer to OT evaluation    Lower Extremity Assessment Lower Extremity Assessment: Generalized weakness    Cervical / Trunk Assessment Cervical / Trunk Assessment: Other exceptions Cervical / Trunk Exceptions: s/p lumbar sx  Communication   Communication: HOH  Cognition Arousal/Alertness: Awake/alert Behavior During Therapy: WFL for tasks assessed/performed Overall Cognitive Status: Impaired/Different from baseline Area of Impairment: Following commands;Safety/judgement;Awareness;Memory                     Memory: Decreased short-term memory;Decreased recall of precautions Following Commands: Follows one step commands with increased time;Follows one step commands inconsistently Safety/Judgement: Decreased awareness of safety Awareness: Emergent   General Comments: Pleasant and agreeable to therapy. Poor awareness and ST memory for recall of precautions.  Requiring increased cues. Also limited due to significant HOH.      General Comments      Exercises     Assessment/Plan    PT Assessment Patient needs continued PT services  PT Problem List Decreased range of motion;Decreased strength;Decreased balance;Decreased activity tolerance;Decreased mobility;Decreased knowledge of use of DME;Decreased safety awareness;Decreased knowledge of precautions;Pain       PT Treatment Interventions DME instruction;Gait training;Stair training;Functional mobility training;Therapeutic activities;Therapeutic exercise;Neuromuscular re-education;Patient/family education    PT Goals (Current goals can be found in the Care Plan section)  Acute Rehab PT Goals Patient Stated Goal: Pt did not state goals during session.  PT Goal Formulation: With patient Time For Goal Achievement: 08/23/18 Potential to Achieve Goals: Good    Frequency Min 5X/week   Barriers to discharge        Co-evaluation               AM-PAC PT "6 Clicks" Daily Activity  Outcome Measure Difficulty turning over in bed (including adjusting bedclothes, sheets and blankets)?: Unable Difficulty moving from lying on back to sitting on the side of the bed? : Unable Difficulty sitting down on and standing up from a chair with arms (e.g., wheelchair, bedside commode, etc,.)?: Unable Help needed moving to and from a bed to chair (including a wheelchair)?: A Little Help needed walking in hospital room?: A Little Help needed climbing 3-5 steps with a railing? : A Little 6 Click Score: 12    End of Session Equipment Utilized During Treatment: Gait belt;Back brace Activity Tolerance: Patient limited by fatigue(Falling asleep mid sentence at end of session) Patient left: in chair;with call bell/phone within reach Nurse Communication: Mobility status PT  Visit Diagnosis: Unsteadiness on feet (R26.81);Pain;Difficulty in walking, not elsewhere classified (R26.2) Pain - part of body:  (back)    Time: 1607-3710 PT Time Calculation (min) (ACUTE ONLY): 15 min   Charges:   PT Evaluation $PT Eval Moderate Complexity: 1 Mod          Rolinda Roan, PT, DPT Acute Rehabilitation Services Pager: [phone] Office: [phone]   Thelma Comp 08/16/2018, 9:59 AM

## 2018-08-16 NOTE — Progress Notes (Signed)
Physical Therapy Treatment Patient Details Name: Keith Drake MRN: [id number] DOB: [birth year] Today's Date: 08/16/2018    History of Present Illness Pt is an 83 y/o male who presents s/p L4-L5 PLIF on 08/15/18. PMH significant for MII, R BBB, PPM implant 07/20/18, HTN, gout, CKD III, CA.    PT Comments    Pt progressing towards physical therapy goals. Assist required for brace application and balance support with OOB mobility. Overall did not appear to require as much processing time to follow commands this session. Would still like to see patient again tomorrow to reinforce precautions and safety with functional mobility. Will continue to follow and progress as able per POC.   Follow Up Recommendations  Home health PT;Supervision/Assistance - 24 hour     Equipment Recommendations  Rolling walker with 5" wheels    Recommendations for Other Services       Precautions / Restrictions Precautions Precautions: Back;Fall Precaution Booklet Issued: Yes (comment) Precaution Comments: Pt unable to recall any precautions from morning session. Pt was educated on 3/3 precautions.  Required Braces or Orthoses: Other Brace/Splint Other Brace/Splint: No brace per MD order however lumbar corset in room. Donned brace to increase adherance to precautions.  Restrictions Weight Bearing Restrictions: No    Mobility  Bed Mobility Overal bed mobility: Needs Assistance Bed Mobility: Rolling;Sidelying to Sit Rolling: Min guard Sidelying to sit: Min assist       General bed mobility comments: Assist for LE movement off EOB as well as for trunk elevation to full sitting position.   Transfers Overall transfer level: Needs assistance Equipment used: Rolling walker (2 wheeled) Transfers: Sit to/from Stand Sit to Stand: Min guard         General transfer comment: Hands on guarding required for power-up to full stand. VC's for hand placement on seated surface for safety.    Ambulation/Gait Ambulation/Gait assistance: Min guard Gait Distance (Feet): 300 Feet Assistive device: Rolling walker (2 wheeled) Gait Pattern/deviations: Decreased stride length;Shuffle;Trunk flexed;Narrow base of support Gait velocity: Decreased Gait velocity interpretation: 1.31 - 2.62 ft/sec, indicative of limited community ambulator General Gait Details: VC's for increased step/stride length, improved posture, and closer walker proximity. Pt able to make corrective changes but unable to maintain. Hands-on guarding provided throughout gait training however occasional min assist required for unsteadiness LOB   Stairs             Wheelchair Mobility    Modified Rankin (Stroke Patients Only)       Balance Overall balance assessment: Needs assistance Sitting-balance support: No upper extremity supported;Feet supported Sitting balance-[name]: Fair     Standing balance support: Bilateral upper extremity supported;During functional activity Standing balance-[name]: Poor Standing balance comment: Reliant on UE support                            Cognition Arousal/Alertness: Awake/alert Behavior During Therapy: WFL for tasks assessed/performed Overall Cognitive Status: Impaired/Different from baseline Area of Impairment: Following commands;Safety/judgement;Awareness;Memory                     Memory: Decreased short-term memory;Decreased recall of precautions Following Commands: Follows one step commands with increased time;Follows one step commands inconsistently Safety/Judgement: Decreased awareness of safety Awareness: Emergent   General Comments: Pleasant and agreeable to therapy. Poor awareness and ST memory for recall of precautions. Requiring increased cues. Also limited due to significant HOH.      Exercises  General Comments        Pertinent Vitals/Pain Pain Assessment: Faces Faces Pain Scale: Hurts little more Pain  Location: Back Pain Descriptors / Indicators: Discomfort;Grimacing Pain Intervention(s): Monitored during session    Home Living                      Prior Function            PT Goals (current goals can now be found in the care plan section) Acute Rehab PT Goals Patient Stated Goal: To be able to ride his gator again PT Goal Formulation: With patient Time For Goal Achievement: 08/23/18 Potential to Achieve Goals: Good Progress towards PT goals: Progressing toward goals    Frequency    Min 5X/week      PT Plan Current plan remains appropriate    Co-evaluation              AM-PAC PT "6 Clicks" Daily Activity  Outcome Measure  Difficulty turning over in bed (including adjusting bedclothes, sheets and blankets)?: Unable Difficulty moving from lying on back to sitting on the side of the bed? : Unable Difficulty sitting down on and standing up from a chair with arms (e.g., wheelchair, bedside commode, etc,.)?: Unable Help needed moving to and from a bed to chair (including a wheelchair)?: A Little Help needed walking in hospital room?: A Little Help needed climbing 3-5 steps with a railing? : A Little 6 Click Score: 12    End of Session Equipment Utilized During Treatment: Gait belt;Back brace Activity Tolerance: Patient tolerated treatment well Patient left: in chair;with call bell/phone within reach Nurse Communication: Mobility status PT Visit Diagnosis: Unsteadiness on feet (R26.81);Pain;Difficulty in walking, not elsewhere classified (R26.2) Pain - part of body: (back)     Time: 1354-1420 PT Time Calculation (min) (ACUTE ONLY): 26 min  Charges:  $Gait Training: 23-37 mins                     Rolinda Roan, PT, DPT Acute Rehabilitation Services Pager: [phone] Office: [phone]    Thelma Comp 08/16/2018, 2:32 PM

## 2018-08-16 NOTE — Evaluation (Signed)
Occupational Therapy Evaluation Patient Details Name: Keith Drake MRN: [id number] DOB: [birth year] Today's Date: 08/16/2018    History of Present Illness  83 yo male s/p lumbar interbody fusion and decompression of L4-5. PMH including bladder cancer, CKD, HTN, HOH, and DM type ll.    Clinical Impression   PTA, pt was living alone and was independent; pt planning for close friend to stay at dc. Pt currently requiring Min A for UB ADLs, Mod-Max A for LB ADLs, and Min A for functional mobility with RW. Pt presenting with poor adherence and recall of back precautions, decreased safety and awareness, and poor balance. Pt currently at risks for falls and would benefit from one additional night for recovery and an additional PT/OT visit tomorrow. Pt will require further acute OT to address back precautions during ADLs and facilitate safe dc. Recommend dc to home with 24/7 supervision and HHOT for further OT to optimize safety, independence with ADLs, and return to PLOF.      Follow Up Recommendations  Home health OT;Supervision/Assistance - 24 hour    Equipment Recommendations  3 in 1 bedside commode;Other (comment)(RW)    Recommendations for Other Services PT consult     Precautions / Restrictions Precautions Precautions: Back;Fall Precaution Booklet Issued: Yes (comment) Precaution Comments: Reviewing back precautions throughout session. Pt with poor adherance and requiring cues throughout to recall and maintain Required Braces or Orthoses: Other Brace/Splint Other Brace/Splint: No brace per MD order; brace in room. Donned brace to increase adherance to precautions.  Restrictions Weight Bearing Restrictions: No      Mobility Bed Mobility Overal bed mobility: Needs Assistance Bed Mobility: Rolling;Sidelying to Sit Rolling: Min assist Sidelying to sit: Min assist       General bed mobility comments: Min A for rolling to side and maintaining sidelying position to bring BLEs over  EOB.   Transfers Overall transfer level: Needs assistance Equipment used: Rolling walker (2 wheeled) Transfers: Sit to/from Stand Sit to Stand: Min assist;From elevated surface         General transfer comment: Min A to power up into standing and then gain balance. Pt requiring cues for hand placement and weight shift.    Balance Overall balance assessment: Needs assistance Sitting-balance support: No upper extremity supported;Feet supported Sitting balance-[name]: Fair     Standing balance support: Bilateral upper extremity supported;During functional activity Standing balance-[name]: Poor Standing balance comment: Reliant on UE support                           ADL either performed or assessed with clinical judgement   ADL Overall ADL's : Needs assistance/impaired Eating/Feeding: Set up;Sitting   Grooming: Set up;Supervision/safety;Sitting   Upper Body Bathing: Minimal assistance;Sitting   Lower Body Bathing: Moderate assistance;Sit to/from stand;Cueing for back precautions   Upper Body Dressing : Minimal assistance;Sitting Upper Body Dressing Details (indicate cue type and reason): Min A to don brace. Pt with poor sequencing and required Max cues to recall and problem solve sequence to don brace. Min A for positioning as well Lower Body Dressing: Maximal assistance;Sit to/from stand;Cueing for back precautions Lower Body Dressing Details (indicate cue type and reason): Pt unable to bring ankles to knees Toilet Transfer: Minimal assistance;Ambulation;RW(Simulated in room)     Toileting - Clothing Manipulation Details (indicate cue type and reason): Pt will need further education    Tub/Shower Transfer Details (indicate cue type and reason): Pt will need further education Functional mobility  during ADLs: Minimal assistance;Rolling walker General ADL Comments: Pt limited by decreased ST memory, recall of precautions, and HOH. Initiating education on  back precautions, bed mobility, and brace management. Pt will need further OT for LB ADLs with AE and caregiver. Pt's caregiver not present.     Vision         Perception     Praxis      Pertinent Vitals/Pain Pain Assessment: Faces Faces Pain Scale: Hurts little more Pain Location: Back Pain Descriptors / Indicators: Discomfort;Grimacing Pain Intervention(s): Monitored during session;Repositioned     Hand Dominance     Extremity/Trunk Assessment Upper Extremity Assessment Upper Extremity Assessment: Overall WFL for tasks assessed   Lower Extremity Assessment Lower Extremity Assessment: Defer to PT evaluation   Cervical / Trunk Assessment Cervical / Trunk Assessment: Other exceptions Cervical / Trunk Exceptions: s/p lumbar sx   Communication Communication Communication: HOH   Cognition Arousal/Alertness: Awake/alert Behavior During Therapy: WFL for tasks assessed/performed Overall Cognitive Status: Impaired/Different from baseline Area of Impairment: Following commands;Safety/judgement;Awareness;Memory                     Memory: Decreased short-term memory;Decreased recall of precautions Following Commands: Follows one step commands with increased time;Follows one step commands inconsistently Safety/Judgement: Decreased awareness of safety Awareness: Emergent   General Comments: Pleasant and agreeable to therapy. Poor awareness and ST memory for recall of precautions. Requiring increased cues. Also limited due to significant HOH.   General Comments       Exercises     Shoulder Instructions      Home Living Family/patient expects to be discharged to:: Private residence Living Arrangements: Alone Available Help at Discharge: Friend(s);Available PRN/intermittently Type of Home: House Home Access: Stairs to enter [employer] of Steps: 2 Entrance Stairs-Rails: Left Home Layout: One level     Bathroom Shower/Tub: Tub/shower unit;Curtain    Biochemist, clinical: Standard     Home Equipment: Environmental consultant - 4 wheels;Shower seat          Prior Functioning/Environment Level of Independence: Independent        Comments: Performed ADLs and IADLs including cooking, cleaning, and driving.        OT Problem List: Decreased strength;Decreased range of motion;Decreased activity tolerance;Impaired balance (sitting and/or standing);Decreased safety awareness;Decreased knowledge of use of DME or AE;Decreased knowledge of precautions;Pain      OT Treatment/Interventions: Self-care/ADL training;Therapeutic exercise;Energy conservation;DME and/or AE instruction;Therapeutic activities;Patient/family education    OT Goals(Current goals can be found in the care plan section) Acute Rehab OT Goals Patient Stated Goal: "Return home" OT Goal Formulation: With patient Time For Goal Achievement: 08/30/18 Potential to Achieve Goals: Good  OT Frequency: Min 2X/week   Barriers to D/C:            Co-evaluation              AM-PAC PT "6 Clicks" Daily Activity     Outcome Measure Help from another person eating meals?: None Help from another person taking care of personal grooming?: A Little Help from another person toileting, which includes using toliet, bedpan, or urinal?: A Little Help from another person bathing (including washing, rinsing, drying)?: A Lot Help from another person to put on and taking off regular upper body clothing?: A Little Help from another person to put on and taking off regular lower body clothing?: A Lot 6 Click Score: 17   End of Session Equipment Utilized During Treatment: Gait belt;Rolling walker;Back brace Nurse Communication: Mobility status;Precautions  Activity Tolerance: Patient limited by pain;Patient limited by fatigue Patient left: Other (comment);with nursing/sitter in room(With PT in hallway)  OT Visit Diagnosis: Unsteadiness on feet (R26.81);Other abnormalities of gait and mobility  (R26.89);Muscle weakness (generalized) (M62.81);Pain Pain - part of body: (Back)                Time: 6237-6283 OT Time Calculation (min): 19 min Charges:  OT General Charges $OT Visit: 1 Visit OT Evaluation $OT Eval Moderate Complexity: [place], OTR/L Acute Rehab Pager: [phone] Office: [place] 08/16/2018, 9:42 AM

## 2018-08-16 NOTE — Progress Notes (Signed)
Patient ID: Keith Drake, male   DOB: 1934-12-12, 83 y.o.   MRN: [id number] Patient doing very well significant improvement lower extremity function and pain.  Strength 5 out of 5 wound clean dry and intact  Mobilized day with physical therapy the patient voids to be discharged later this afternoon.

## 2018-08-17 LAB — GLUCOSE, CAPILLARY: Glucose-Capillary: 236 mg/dL — ABNORMAL HIGH (ref 70–99)

## 2018-08-17 NOTE — Progress Notes (Signed)
Occupational Therapy Treatment Patient Details Name: Keith Drake MRN: [id number] DOB: 02-Apr-1935 Today's Date: 08/17/2018    History of present illness Pt is an 83 y/o male who presents s/p L4-L5 PLIF on 08/15/18. PMH significant for MII, R BBB, PPM implant 07/20/18, HTN, gout, CKD III, CA.   OT comments  Pt progressing towards established OT goals. Pt continues to present with decreased awareness and recall of precautions requiring Max cues for precautions; pt caregiver able to recall precautions. Providing education on LB ADLs with AE, toileting, and tub transfer with caregiver present. Pt requiring Min A for LB dressing with AE. Pt performing simulated tub transfer with Min A and RW. Continue to recommend dc home with HHOT to optimize safety and independence with ADLs and functional mobility.    Follow Up Recommendations  Home health OT;Supervision/Assistance - 24 hour    Equipment Recommendations  3 in 1 bedside commode;Other (comment)(RW)    Recommendations for Other Services PT consult    Precautions / Restrictions Precautions Precautions: Back;Fall Precaution Booklet Issued: Yes (comment) Precaution Comments: Pt unable to recall any precautions from morning session. Pt was educated on 3/3 precautions.  Required Braces or Orthoses: Other Brace/Splint Other Brace/Splint: No brace per MD order however lumbar corset in room. Donned brace to increase adherance to precautions.  Restrictions Weight Bearing Restrictions: No       Mobility Bed Mobility Overal bed mobility: Needs Assistance Bed Mobility: Rolling;Sidelying to Sit Rolling: Min guard Sidelying to sit: Min assist       General bed mobility comments: Pt in recliner upon arrival  Transfers Overall transfer level: Needs assistance Equipment used: Rolling walker (2 wheeled) Transfers: Sit to/from Stand Sit to Stand: Min guard         General transfer comment: Hands on guarding required for power-up to full  stand. VC's for hand placement on seated surface for safety. Pt required several attempts to stand without assist.     Balance Overall balance assessment: Needs assistance Sitting-balance support: No upper extremity supported;Feet supported Sitting balance-[name]: Fair     Standing balance support: Bilateral upper extremity supported;During functional activity Standing balance-[name]: Fair Standing balance comment: able to maintain standing during LB ADLs without UE support                           ADL either performed or assessed with clinical judgement   ADL Overall ADL's : Needs assistance/impaired                 Upper Body Dressing : Supervision/safety;Sitting Upper Body Dressing Details (indicate cue type and reason): Supervision for donning shirt and brace. Requiring Min cues for sequencing of brace Lower Body Dressing: Minimal assistance;Sit to/from stand;With adaptive equipment;Cueing for back precautions;Cueing for sequencing;Cueing for compensatory techniques Lower Body Dressing Details (indicate cue type and reason): Providing education on LB dressing with AE. Pt donning underwear and pants with Min A for use of AE and sequencing LB dressing. Pt donning/doffing socks with AE and supervision.      Toileting- Water quality scientist and Hygiene: Min guard Toileting - Clothing Manipulation Details (indicate cue type and reason): Min Guard A for safety in standing. education on toilet hygiene and compensatory techniques. Tub/ Shower Transfer: Minimal assistance;With caregiver independent assisting;Tub transfer;Rolling walker Tub/Shower Transfer Details (indicate cue type and reason): MIn A for balance and safety. Pt performing simulated tub transfer with significant other present.  Functional mobility during ADLs: Minimal assistance;Rolling walker  General ADL Comments: Pt continues to present with decreased awarnesss and recall of precautions. Pt  significant other present throughout session. Providing education on LB ADLs with AE, toilet hygiene, and tub transfer.      Vision       Perception     Praxis      Cognition Arousal/Alertness: Awake/alert Behavior During Therapy: WFL for tasks assessed/performed Overall Cognitive Status: Impaired/Different from baseline Area of Impairment: Memory;Safety/judgement;Problem solving                     Memory: Decreased short-term memory;Decreased recall of precautions   Safety/Judgement: Decreased awareness of safety Awareness: Emergent   General Comments: Continues to present with decreased ST memory and requiring increased cues for problem solving during ADLs.        Exercises     Shoulder Instructions       General Comments Signficant other present throughout session    Pertinent Vitals/ Pain       Pain Assessment: Faces Faces Pain Scale: Hurts a little bit Pain Location: Back Pain Descriptors / Indicators: Discomfort;Grimacing Pain Intervention(s): Monitored during session;Limited activity within patient's tolerance;Repositioned  Home Living                                          Prior Functioning/Environment              Frequency  Min 2X/week        Progress Toward Goals  OT Goals(current goals can now be found in the care plan section)  Progress towards OT goals: Progressing toward goals  Acute Rehab OT Goals Patient Stated Goal: To be able to ride his golf cart again OT Goal Formulation: With patient Time For Goal Achievement: 08/30/18 Potential to Achieve Goals: Good ADL Goals Pt Will Perform Upper Body Dressing: with set-up;with supervision;sitting;with caregiver independent in assisting Pt Will Perform Lower Body Dressing: with set-up;with supervision;with adaptive equipment;sit to/from stand;with caregiver independent in assisting Pt Will Transfer to Toilet: with set-up;with supervision;bedside  commode;ambulating Pt Will Perform Toileting - Clothing Manipulation and hygiene: with set-up;with supervision;sitting/lateral leans;sit to/from stand Pt Will Perform Tub/Shower Transfer: Tub transfer;rolling walker;ambulating;shower seat;with min assist Additional ADL Goal #1: Pt will perform bed mobility demosntrating log roll technique with supervision Additional ADL Goal #2: Pt will verbalize 3/3 back precautions independently  Plan Discharge plan remains appropriate    Co-evaluation                 AM-PAC PT "6 Clicks" Daily Activity     Outcome Measure   Help from another person eating meals?: None Help from another person taking care of personal grooming?: A Little Help from another person toileting, which includes using toliet, bedpan, or urinal?: A Little Help from another person bathing (including washing, rinsing, drying)?: A Little Help from another person to put on and taking off regular upper body clothing?: A Little Help from another person to put on and taking off regular lower body clothing?: A Little 6 Click Score: 19    End of Session Equipment Utilized During Treatment: Gait belt;Rolling walker;Back brace  OT Visit Diagnosis: Unsteadiness on feet (R26.81);Other abnormalities of gait and mobility (R26.89);Muscle weakness (generalized) (M62.81);Pain Pain - part of body: (Back)   Activity Tolerance Patient tolerated treatment well   Patient Left in chair;with call bell/phone within reach;with family/visitor present   Nurse Communication Mobility  status;Precautions        Time: 1443-6016 OT Time Calculation (min): 33 min  Charges: OT General Charges $OT Visit: 1 Visit OT Treatments $Self Care/Home Management : 23-37 mins  [place], OTR/L Acute Rehab Pager: [phone] Office: [place] 08/17/2018, 10:46 AM

## 2018-08-17 NOTE — Progress Notes (Signed)
Patient discharge o home, friend Pam at bedside. D/c instructions and limitations and follow up appointments  discussed  Verbalized understanding.

## 2018-08-17 NOTE — Progress Notes (Signed)
Physical Therapy Treatment Patient Details Name: Keith Drake MRN: [id number] DOB: [birth year] Today's Date: 08/17/2018    History of Present Illness Pt is an 83 y/o male who presents s/p L4-L5 PLIF on 08/15/18. PMH significant for MII, R BBB, PPM implant 07/20/18, HTN, gout, CKD III, CA.    PT Comments    Pt progressing towards physical therapy goals. Able to complete OOB transfers and ambulation with increased ease and independence however continues to require step-by-step cues and assist for proper log roll technique. Girlfriend present for education as well, and discussed car transfer, and recommendation for RW instead of rollator for increased stability and safety at this time. Will continue to follow and progress as able per POC.    Follow Up Recommendations  Home health PT;Supervision/Assistance - 24 hour     Equipment Recommendations  Rolling walker with 5" wheels    Recommendations for Other Services       Precautions / Restrictions Precautions Precautions: Back;Fall Precaution Booklet Issued: Yes (comment) Precaution Comments: Pt unable to recall any precautions from morning session. Pt was educated on 3/3 precautions.  Required Braces or Orthoses: Other Brace/Splint Other Brace/Splint: No brace per MD order however lumbar corset in room. Donned brace to increase adherance to precautions.  Restrictions Weight Bearing Restrictions: No    Mobility  Bed Mobility Overal bed mobility: Needs Assistance Bed Mobility: Rolling;Sidelying to Sit Rolling: Min guard Sidelying to sit: Min assist       General bed mobility comments: Assist for LE movement off EOB as well as for trunk elevation to full sitting position. Practiced several times to ensure proper log roll technique however continued to require assist and cues.   Transfers Overall transfer level: Needs assistance Equipment used: Rolling walker (2 wheeled) Transfers: Sit to/from Stand Sit to Stand: Min guard          General transfer comment: Hands on guarding required for power-up to full stand. VC's for hand placement on seated surface for safety. Pt required several attempts to stand without assist.   Ambulation/Gait Ambulation/Gait assistance: Min guard;Supervision Gait Distance (Feet): 325 Feet Assistive device: Rolling walker (2 wheeled) Gait Pattern/deviations: Decreased stride length;Shuffle;Trunk flexed;Narrow base of support Gait velocity: Decreased Gait velocity interpretation: 1.31 - 2.62 ft/sec, indicative of limited community ambulator General Gait Details: VC's for increased step/stride length, improved posture, and closer walker proximity. Pt able to make corrective changes but unable to maintain. Initially hands-on guarding progressing to supervision for safety by end of session.    Stairs         General stair comments: Practiced stairs yesterday - feel he can complete without difficulty due to overall improvement today.    Wheelchair Mobility    Modified Rankin (Stroke Patients Only)       Balance Overall balance assessment: Needs assistance Sitting-balance support: No upper extremity supported;Feet supported Sitting balance-[name]: Fair     Standing balance support: Bilateral upper extremity supported;During functional activity Standing balance-[name]: Poor Standing balance comment: Reliant on UE support                            Cognition Arousal/Alertness: Awake/alert Behavior During Therapy: WFL for tasks assessed/performed Overall Cognitive Status: Impaired/Different from baseline Area of Impairment: Memory;Safety/judgement;Problem solving                     Memory: Decreased short-term memory;Decreased recall of precautions   Safety/Judgement: Decreased awareness of safety Awareness:  Emergent   General Comments: Pleasant and agreeable to therapy. Poor awareness and ST memory for recall of precautions. Requiring  increased cues. Also limited due to significant HOH.      Exercises      General Comments        Pertinent Vitals/Pain Pain Assessment: Faces Faces Pain Scale: Hurts a little bit Pain Location: Back Pain Descriptors / Indicators: Discomfort;Grimacing Pain Intervention(s): Monitored during session    Home Living                      Prior Function            PT Goals (current goals can now be found in the care plan section) Acute Rehab PT Goals Patient Stated Goal: To be able to ride his golf cart again PT Goal Formulation: With patient Time For Goal Achievement: 08/23/18 Potential to Achieve Goals: Good Progress towards PT goals: Progressing toward goals    Frequency    Min 5X/week      PT Plan Current plan remains appropriate    Co-evaluation              AM-PAC PT "6 Clicks" Daily Activity  Outcome Measure  Difficulty turning over in bed (including adjusting bedclothes, sheets and blankets)?: A Little Difficulty moving from lying on back to sitting on the side of the bed? : A Little Difficulty sitting down on and standing up from a chair with arms (e.g., wheelchair, bedside commode, etc,.)?: A Little Help needed moving to and from a bed to chair (including a wheelchair)?: A Little Help needed walking in hospital room?: A Little Help needed climbing 3-5 steps with a railing? : A Little 6 Click Score: 18    End of Session Equipment Utilized During Treatment: Gait belt;Back brace Activity Tolerance: Patient tolerated treatment well Patient left: in chair;with call bell/phone within reach Nurse Communication: Mobility status PT Visit Diagnosis: Unsteadiness on feet (R26.81);Pain;Difficulty in walking, not elsewhere classified (R26.2) Pain - part of body: (back)     Time: 0102-7253 PT Time Calculation (min) (ACUTE ONLY): 20 min  Charges:  $Gait Training: 8-22 mins                     Rolinda Roan, PT, DPT Acute Rehabilitation  Services Pager: [phone] Office: [phone]    Thelma Comp 08/17/2018, 8:04 AM

## 2018-08-17 NOTE — Care Management Note (Signed)
Case Management Note  Patient Details  Name: Keith Drake MRN: [id number] Date of Birth: [birth year]  Subjective/Objective:   83 yr old gentleman s/p #1 complete decompressive lumbar laminectomy L4-5 with complete medial facetectomies radical foraminotomies of the L4 and L5 nerve roots,#2 posterior lumbar interbody fusion L4-5, #3vertical screw fixation L4-5.    Action/Plan: Case manager spoke with patient and his friend concerning discharge plan and DME. Choice for Home Health agency was offered, referral was called to Neoma Laming, [place] Liaison.    Expected Discharge Date:  08/17/18               Expected Discharge Plan:  [place]  In-House Referral:  NA  Discharge planning Services  CM Consult  Post Acute Care Choice:  Home Health, Durable Medical Equipment Choice offered to:  Patient  DME Arranged:  Walker rolling DME Agency:  [place] Arranged:  PT, OT [facility] Agency:  [place]  Status of Service:  Completed, signed off  If discussed at [place] of Stay Meetings, dates discussed:    Additional Comments:  Ninfa Meeker, RN 08/17/2018, 11:20 AM

## 2018-08-17 NOTE — Discharge Summary (Signed)
Physician Discharge Summary  Patient ID: Keith Drake MRN: [id number] DOB/AGE: 02/14/1935 83 y.o.  Admit date: 08/15/2018 Discharge date: 08/17/2018  Admission Diagnoses: Grade 1 spondylolisthesis L4-5 with lumbar spinal stenosis and neurogenic location   Discharge Diagnoses: same   Discharged Condition: good  Hospital Course: The patient was admitted on 08/15/2018 and taken to the operating room where the patient underwent plif L4-5. The patient tolerated the procedure well and was taken to the recovery room and then to the floor in stable condition. The hospital course was routine. There were no complications. The wound remained clean dry and intact. Pt had appropriate back soreness. No complaints of leg pain or new N/T/W. The patient remained afebrile with stable vital signs, and tolerated a regular diet. The patient continued to increase activities, and pain was well controlled with oral pain medications.   Consults: None  Significant Diagnostic Studies:  Results for orders placed or performed during the hospital encounter of 08/15/18  CBC  Result Value Ref Range   WBC 8.5 4.0 - 10.5 K/uL   RBC 4.68 4.22 - 5.81 MIL/uL   Hemoglobin 12.3 (L) 13.0 - 17.0 g/dL   HCT 41.3 39.0 - 52.0 %   MCV 88.2 80.0 - 100.0 fL   MCH 26.3 26.0 - 34.0 pg   MCHC 29.8 (L) 30.0 - 36.0 g/dL   RDW 15.0 11.5 - 15.5 %   Platelets 226 150 - 400 K/uL   nRBC 0.0 0.0 - 0.2 %  Glucose, capillary  Result Value Ref Range   Glucose-Capillary 122 (H) 70 - 99 mg/dL   Comment 1 Notify RN    Comment 2 Document in Chart   Glucose, capillary  Result Value Ref Range   Glucose-Capillary 108 (H) 70 - 99 mg/dL   Comment 1 Notify RN    Comment 2 Call MD NNP PA CNM    Comment 3 Document in Chart   Glucose, capillary  Result Value Ref Range   Glucose-Capillary 101 (H) 70 - 99 mg/dL   Comment 1 Notify RN   Glucose, capillary  Result Value Ref Range   Glucose-Capillary 138 (H) 70 - 99 mg/dL  Glucose, capillary   Result Value Ref Range   Glucose-Capillary 186 (H) 70 - 99 mg/dL   Comment 1 Notify RN    Comment 2 Document in Chart   Glucose, capillary  Result Value Ref Range   Glucose-Capillary 189 (H) 70 - 99 mg/dL   Comment 1 Notify RN    Comment 2 Document in Chart   Glucose, capillary  Result Value Ref Range   Glucose-Capillary 210 (H) 70 - 99 mg/dL  Glucose, capillary  Result Value Ref Range   Glucose-Capillary 197 (H) 70 - 99 mg/dL  Glucose, capillary  Result Value Ref Range   Glucose-Capillary 227 (H) 70 - 99 mg/dL   Comment 1 Notify RN    Comment 2 Document in Chart   Glucose, capillary  Result Value Ref Range   Glucose-Capillary 236 (H) 70 - 99 mg/dL  Type and screen [place]  Result Value Ref Range   ABO/RH(D) B POS    Antibody Screen NEG    Sample Expiration      08/18/2018 Performed at [facility], [address] [place], [place] [address]     Dg Chest 2 View  Result Date: 07/21/2018 CLINICAL DATA:  83 year old male status post pacemaker placement. EXAM: CHEST - 2 VIEW COMPARISON:  Portable chest 07/20/2018. PA and lateral views  02/26/2011. FINDINGS: PA and lateral views. New left chest dual lead cardiac pacemaker. Leads course to the RA and RV apex region. No pneumothorax or adverse features identified. Stable lung volumes. Both lungs appear clear. Mediastinal contours remain within normal limits. Visualized tracheal air column is within normal limits. Negative visible bowel gas pattern. No acute osseous abnormality identified. IMPRESSION: Left chest cardiac pacemaker placed with no adverse features or acute cardiopulmonary abnormality. Electronically Signed   By: Genevie Ann M.D.   On: 07/21/2018 08:29   Dg Lumbar Spine 2-3 Views  Result Date: 08/15/2018 CLINICAL DATA:  83 year old male for L4-5 fusion. Subsequent encounter. EXAM: LUMBAR SPINE - 2-3 VIEW; DG C-ARM 61-120 MIN COMPARISON:  05/02/2018 MR. FINDINGS: Two intraoperative C-arm views  submitted for review after surgery. Level assignment per prior MR with rudimentary disc at the S1-2 level. Mild anterior slip L4. Fusion L4-5 level with pedicle screws and interbody spacers. IMPRESSION: Fusion L4-5 as noted above. Electronically Signed   By: Genia Del M.D.   On: 08/15/2018 15:58   Dg Chest Port 1 View  Result Date: 07/20/2018 CLINICAL DATA:  Bradycardia. EXAM: PORTABLE CHEST 1 VIEW COMPARISON:  Radiographs of Feb 26, 2011. FINDINGS: Stable cardiomediastinal silhouette. No pneumothorax or pleural effusion is noted. No acute pulmonary disease is noted. Bony thorax is unremarkable. IMPRESSION: No acute cardiopulmonary abnormality seen. Electronically Signed   By: Marijo Conception, M.D.   On: 07/20/2018 10:45   Dg C-arm 1-60 Min  Result Date: 08/15/2018 CLINICAL DATA:  83 year old male for L4-5 fusion. Subsequent encounter. EXAM: LUMBAR SPINE - 2-3 VIEW; DG C-ARM 61-120 MIN COMPARISON:  05/02/2018 MR. FINDINGS: Two intraoperative C-arm views submitted for review after surgery. Level assignment per prior MR with rudimentary disc at the S1-2 level. Mild anterior slip L4. Fusion L4-5 level with pedicle screws and interbody spacers. IMPRESSION: Fusion L4-5 as noted above. Electronically Signed   By: Genia Del M.D.   On: 08/15/2018 15:58    Antibiotics:  Anti-infectives (From admission, onward)   Start     Dose/Rate Route Frequency Ordered Stop   08/15/18 2200  ceFAZolin (ANCEF) IVPB 2g/100 mL premix     2 g 200 mL/hr over 30 Minutes Intravenous Every 12 hours 08/15/18 1718 08/17/18 0939   08/15/18 1730  minocycline (MINOCIN,DYNACIN) capsule 100 mg     100 mg Oral Daily 08/15/18 1718     08/15/18 1500  vancomycin (VANCOCIN) powder  Status:  Discontinued       As needed 08/15/18 1500 08/15/18 1546   08/15/18 1326  bacitracin 50,000 Units in sodium chloride 0.9 % 500 mL irrigation  Status:  Discontinued       As needed 08/15/18 1326 08/15/18 1546      Discharge Exam: Blood  pressure 138/78, pulse 85, temperature 97.6 F (36.4 C), temperature source Oral, resp. rate 18, height 5\' 10"  (1.778 m), weight 90.7 kg, SpO2 100 %. Neurologic: Grossly normal Ambulating voiding well, incisin cdi  Discharge Medications:   Allergies as of 08/17/2018   No Known Allergies     Medication List    TAKE these medications   acetaminophen 500 MG tablet Commonly known as:  TYLENOL Take 1,000 mg by mouth every 6 (six) hours as needed for mild pain.   allopurinol 100 MG tablet Commonly known as:  ZYLOPRIM Take 100 mg by mouth daily.   amLODipine 10 MG tablet Commonly known as:  NORVASC Take 10 mg by mouth daily.   aspirin 81 MG tablet Take  81 mg by mouth daily.   HUMULIN 70/30 (70-30) 100 UNIT/ML injection Generic drug:  insulin NPH-regular Human Inject 15-40 Units into the skin 2 (two) times daily with a meal. Inject 40 units with breakfast and 15 units with supper.   hydrochlorothiazide 25 MG tablet Commonly known as:  HYDRODIURIL Take 25 mg by mouth daily.   minocycline 100 MG capsule Commonly known as:  MINOCIN,DYNACIN Take 100 mg by mouth daily.   Oxycodone HCl 10 MG Tabs Take 1 tablet (10 mg total) by mouth every 3 (three) hours as needed for severe pain ((score 7 to 10)).   rosuvastatin 40 MG tablet Commonly known as:  CRESTOR Take 40 mg by mouth daily.            Durable Medical Equipment  (From admission, onward)         Start     Ordered   08/17/18 1054  For home use only DME Walker rolling  [facility])  Once    Question:  Patient needs a walker to treat with the following condition  Answer:  H/O lumbosacral spine surgery   08/17/18 1054   08/17/18 1044  For home use only DME 3 n 1  Once     08/17/18 1043   08/17/18 1044  For home use only DME Walker rolling  Once    Question:  Patient needs a walker to treat with the following condition  Answer:  Gait abnormality   08/17/18 1043          Disposition: home   Final Dx: plif  L4-5  Discharge Instructions     Remove dressing in 72 hours   Complete by:  As directed    Call MD for:  difficulty breathing, headache or visual disturbances   Complete by:  As directed    Call MD for:  hives   Complete by:  As directed    Call MD for:  persistant dizziness or light-headedness   Complete by:  As directed    Call MD for:  persistant nausea and vomiting   Complete by:  As directed    Call MD for:  redness, tenderness, or signs of infection (pain, swelling, redness, odor or green/yellow discharge around incision site)   Complete by:  As directed    Call MD for:  severe uncontrolled pain   Complete by:  As directed    Call MD for:  temperature >100.4   Complete by:  As directed    Diet - low sodium heart healthy   Complete by:  As directed    Driving Restrictions   Complete by:  As directed    No driving for 2 weeks, no riding in the car for 1 week   Increase activity slowly   Complete by:  As directed    Lifting restrictions   Complete by:  As directed    No lifting more than 8 lbs      Follow-up Information    Kary Kos, MD. Schedule an appointment as soon as possible for a visit in 2 week(s).   Specialty:  Neurosurgery Contact information: [address] [place] [address] [place] [address] [phone]            Signed: Ocie Cornfield [facility] 08/17/2018, 10:54 AM

## 2018-08-17 NOTE — Anesthesia Postprocedure Evaluation (Signed)
Anesthesia Post Note  Patient: Keith Drake  Procedure(s) Performed: POSTERIOR LUMBAR INTERBODY FUSION - LUMBAR FOUR-LUMBAR FIVE (N/A Back)     Patient location during evaluation: PACU Anesthesia Type: General Level of consciousness: awake and alert Pain management: pain level controlled Vital Signs Assessment: post-procedure vital signs reviewed and stable Respiratory status: spontaneous breathing, nonlabored ventilation, respiratory function stable and patient connected to nasal cannula oxygen Cardiovascular status: blood pressure returned to baseline and stable Postop Assessment: no apparent nausea or vomiting Anesthetic complications: no    Last Vitals:  Vitals:   08/17/18 0407 08/17/18 0728  BP: 135/79 138/78  Pulse: 84 85  Resp: 18 18  Temp: 36.7 C 36.4 C  SpO2: 96% 100%    Last Pain:  Vitals:   08/17/18 0833  TempSrc:   PainSc: 0-No pain                 Chabeli Barsamian

## 2018-08-17 NOTE — Progress Notes (Signed)
Inpatient Diabetes Program Recommendations  AACE/ADA: New Consensus Statement on Inpatient Glycemic Control (2015)  Target Ranges:  Prepandial:   less than 140 mg/dL      Peak postprandial:   less than 180 mg/dL (1-2 hours)      Critically ill patients:  140 - 180 mg/dL   Lab Results  Component Value Date   GLUCAP 236 (H) 08/17/2018   HGBA1C 9.2 (H) 07/20/2018    Review ofResults for QUOC, TOME (MRN [id number]) as of 08/17/2018 09:29  Ref. Range 08/16/2018 06:33 08/16/2018 12:14 08/16/2018 16:23 08/16/2018 21:02 08/17/2018 07:07  Glucose-Capillary Latest Ref Range: 70 - 99 mg/dL 189 (H) 210 (H) 197 (H) 227 (H) 236 (H)   Glycemic Control   Diabetes history: Type 2 DM  Outpatient Diabetes medications: Novolin 70/30 40 units in the AM and 15 units in the PM Current orders for Inpatient glycemic control:  Novolog resistant tid with meals and HS, Novolog 4 units tid with meals  Inpatient Diabetes Program Recommendations:   Note that patient is on 70/30 insulin at home.  Please consider restarting Novolin 70/30-20 units in the AM and 10 units in the PM.  If 70/30 restarting meal coverage 4 units tid with meals will need to be discontinued.   Thanks,  Adah Perl, RN, BC-ADM Inpatient Diabetes Coordinator Pager [phone] (8a-5p)

## 2018-08-30 DIAGNOSIS — N342 Other urethritis: Secondary | ICD-10-CM | POA: Insufficient documentation

## 2018-09-07 ENCOUNTER — Telehealth: Payer: Self-pay | Admitting: *Deleted

## 2018-09-07 NOTE — Telephone Encounter (Signed)
LMOVM TO CONTACT CLINIC BACK

## 2018-09-07 NOTE — Telephone Encounter (Signed)
-----   Message from [place], [place] sent at 09/02/2018  1:59 PM EST ----- Please forward lab to Dr. Justin Mend as well.  Pt needs follow up.  Let me know if any issues, at least a repeat BMET with [place] by next week  Thanks renee

## 2018-09-13 ENCOUNTER — Telehealth: Payer: Self-pay | Admitting: Physician Assistant

## 2018-09-13 NOTE — Telephone Encounter (Signed)
Called to follow up labs, and nephrology follow up.  The patient tells me he is feelin "great", his back surgery went well and has no pain.  He denies any symptoms of any kind.  He is due to see Dr. Justin Mend early December, can not recall the date off hand.  He reports that his PMD nurse reported to him that his labs done a few weeks ago looked OK, these done 2/2 to a increase in urinary frequency/burning that has since resolved.  He would like to see his nephrologist and have them do labs when he sees them.  I will ask our staff to be sure Dr. Jason Nest office has received his labs from last month (as previsouly requested).  Tommye Standard, PA-C

## 2018-10-28 ENCOUNTER — Ambulatory Visit (INDEPENDENT_AMBULATORY_CARE_PROVIDER_SITE_OTHER): Payer: Medicare Other | Admitting: Cardiovascular Disease

## 2018-10-28 VITALS — BP 124/70 | HR 70 | Ht 70.0 in | Wt 196.0 lb

## 2018-10-28 DIAGNOSIS — I10 Essential (primary) hypertension: Secondary | ICD-10-CM

## 2018-10-28 DIAGNOSIS — I441 Atrioventricular block, second degree: Secondary | ICD-10-CM

## 2018-10-28 DIAGNOSIS — E7800 Pure hypercholesterolemia, unspecified: Secondary | ICD-10-CM | POA: Diagnosis not present

## 2018-10-28 DIAGNOSIS — N183 Chronic kidney disease, stage 3 unspecified: Secondary | ICD-10-CM

## 2018-10-28 DIAGNOSIS — E1122 Type 2 diabetes mellitus with diabetic chronic kidney disease: Secondary | ICD-10-CM

## 2018-10-28 DIAGNOSIS — Z794 Long term (current) use of insulin: Secondary | ICD-10-CM

## 2018-10-28 DIAGNOSIS — Z950 Presence of cardiac pacemaker: Secondary | ICD-10-CM

## 2018-10-28 NOTE — Patient Instructions (Signed)
Medication Instructions:  Dr Sallyanne Kuster recommends that you continue on your current medications as directed. Please refer to the Current Medication list given to you today.  If you need a refill on your cardiac medications before your next appointment, please call your pharmacy.   Testing/Procedures: Remote monitoring is used to monitor your Pacemaker or ICD from home. This monitoring reduces the number of office visits required to check your device to one time per year. It allows [place] to keep an eye on the functioning of your device to ensure it is working properly. You are scheduled for a device check from home on Friday, April 3rd, 2020. You may send your transmission at any time that day. If you have a wireless device, the transmission will be sent automatically. After your physician reviews your transmission, you will receive a postcard with your next transmission date.  To improve our patient care and to more adequately follow your device, [facility] has decided, as a practice, to start following each patient four times a year with your home monitor. This means that you may experience a remote appointment that is close to an in-office appointment with your physician. Your insurance will apply at the same rate as other remote monitoring transmissions.  Follow-Up: At [facility], you and your health needs are our priority.  As part of our continuing mission to provide you with exceptional heart care, we have created designated Provider Care Teams.  These Care Teams include your primary Cardiologist (physician) and Advanced Practice Providers (APPs -  Physician Assistants and Nurse Practitioners) who all work together to provide you with the care you need, when you need it. You will need a follow up appointment in 12 months.  Please call our office 2 months in advance to schedule this appointment.  You may see Sanda Klein, MD or one of the following Advanced Practice Providers on your  designated Care Team: [place], [place] . Fabian Sharp, PA-C . You will receive a reminder letter in the mail two months in advance. If you don't receive a letter, please call our office to schedule the follow-up appointment.

## 2018-10-28 NOTE — Progress Notes (Signed)
Cardiology Office Note:    Date:  10/28/2018   ID:  [name], DOB [birth year], MRN [id number]  PCP:  Crist Infante, MD  Cardiologist:  Sanda Klein, MD  Electrophysiologist:  None   Referring MD: Crist Infante, MD   Chief Complaint  Patient presents with  . Follow-up    3 months post implant.    History of Present Illness:    Keith Drake is a 84 y.o. male with a hx of recurrent episodes of near syncope related to second-degree AV block Mobitz type I, who underwent implantation of a dual-chamber pacemaker in September 2019 ([place]).  Arrhythmia was detected when he was preparing to undergo lumbar spine surgery (spondylolisthesis L4-L5 with lumbar stenosis and neurogenic claudication).  The pacemaker site has healed very well and does not bother him at all.  He has not had any further episodes of near syncope (which he had thought for months were due to "low blood sugar").  The patient specifically denies any chest pain at rest exertion, dyspnea at rest or with exertion, orthopnea, paroxysmal nocturnal dyspnea, syncope, palpitations, focal neurological deficits, intermittent claudication, lower extremity edema, unexplained weight gain, cough, hemoptysis or wheezing.  Pacemaker function is normal.  Estimated generator longevity is 9.5 years.  All lead parameters are excellent.  Lead outputs are chronic settings.  Presenting rhythm is AV sequential pacing he has roughly 50% atrial pacing 99% ventricular pacing with satisfactory heart rate histogram distribution.  32 episodes of advanced mode switch are recorded but the vast majority are due to the sensor driven competitive pacing.  Very rare and brief episodes of true paroxysmal atrial tachycardia recorded.  There is no atrial fibrillation.  Past Medical History:  Diagnosis Date  . Anemia   . Arthritis    OCC LEG PAIN  . Bladder cancer ([place]) TCC   History of bladder carcinoma; S/P BCG TX'S ; "never treated for cancer;  it was a spot on my bladder" (07/20/2018)  . CKD (chronic kidney disease), stage III ([place])   . History of gout    "on daily RX" (07/20/2018)  . History of kidney stones   . Hyperlipidemia   . Hypertension   . Impaired hearing BILATERAL    OCCASIONAL WEARS HEARING AIDS  . Mobitz I    [name] 07/20/2018  . Pneumonia  ~ 1980s X 1  . Presence of permanent cardiac pacemaker 07/20/2018  . Right bundle branch block   . Spondylolisthesis   . Type II diabetes mellitus ([place])     Past Surgical History:  Procedure Laterality Date  . COLONOSCOPY  03/2004  . CYSTO / RESECTIONAL BLADDER BX'S  05-01-2011  . CYSTOSCOPY WITH BIOPSY  10/05/2011   Procedure: CYSTOSCOPY WITH BIOPSY;  Surgeon: Claybon Jabs, MD;  Location: [facility];  Service: Urology;  Laterality: N/A;  BLADDER BIOPSY   . CYSTOSCOPY WITH LITHOLAPAXY    . EXCISIONAL HEMORRHOIDECTOMY  1980s  . INSERT / REPLACE / REMOVE PACEMAKER  07/20/2018   Implantation of new dual chamber permanent pacemaker  . PACEMAKER IMPLANT N/A 07/20/2018   Procedure: PACEMAKER IMPLANT;  Surgeon: Sanda Klein, MD;  Location: [facility];  Service: Cardiovascular;  Laterality: N/A;  . REFRACTIVE SURGERY Bilateral   . TRANSURETHRAL RESECTION OF BLADDER TUMOR  03-02-2011  . US ECHOCARDIOGRAPHY  07-11-2009   EF 55-60%    Current Medications: No outpatient medications have been marked as taking for the 10/28/18 encounter (Office Visit) with Sanda Klein, MD.  Allergies:   Patient has no known allergies.   Social History   Socioeconomic History  . Marital status: Divorced    Spouse name: Not on file  . Number of children: Not on file  . Years of education: Not on file  . Highest education level: Not on file  Occupational History  . Not on file  Social Needs  . Financial resource strain: Not on file  . Food insecurity:    Worry: Not on file    Inability: Not on file  . Transportation needs:    Medical: Not on file     Non-medical: Not on file  Tobacco Use  . Smoking status: Current Some Day Smoker    Packs/day: 0.10    Years: 30.00    Pack years: 3.00    Types: Cigars  . Smokeless tobacco: Never Used  Substance and Sexual Activity  . Alcohol use: Not Currently    Comment: 08/12/18 "1 beer/year"  . Drug use: Never  . Sexual activity: Not Currently  Lifestyle  . Physical activity:    Days per week: Not on file    Minutes per session: Not on file  . Stress: Not on file  Relationships  . Social connections:    Talks on phone: Not on file    Gets together: Not on file    Attends religious service: Not on file    Active member of club or organization: Not on file    Attends meetings of clubs or organizations: Not on file    Relationship status: Not on file  Other Topics Concern  . Not on file  Social History Narrative  . Not on file     Family History: The patient's family history includes Cancer in his brother and brother; Colon cancer in his brother; Diabetes in his sister.  ROS:   Please see the history of present illness.     All other systems reviewed and are negative.  EKGs/Labs/Other Studies Reviewed:    The following studies were reviewed today: Comprehensive pacemaker check  EKG:  EKG is  ordered today.  The ekg ordered today demonstrates atrial ventricular sequential pacing  Recent Labs: 07/20/2018: ALT 21 08/03/2018: BUN 36; Creatinine, Ser 2.36; Potassium 4.9; Sodium 140 08/15/2018: Hemoglobin 12.3; Platelets 226  Recent Lipid Panel    Component Value Date/Time   CHOL 115 07/20/2018 1012   TRIG 133 07/20/2018 1012   HDL 27 (L) 07/20/2018 1012   CHOLHDL 4.3 07/20/2018 1012   VLDL 27 07/20/2018 1012   LDLCALC 61 07/20/2018 1012    Physical Exam:    VS:  BP 124/70 (BP Location: Left Arm, Patient Position: Sitting, Cuff Size: Normal)   Pulse 70   Ht 5\' 10"  (1.778 m)   Wt 196 lb (88.9 kg)   BMI 28.12 kg/m     Wt Readings from Last 3 Encounters:  10/28/18 196 lb  (88.9 kg)  08/15/18 200 lb (90.7 kg)  07/21/18 196 lb 3.4 oz (89 kg)     GEN:  Well nourished, well developed in no acute distress HEENT: Normal NECK: No JVD; No carotid bruits LYMPHATICS: No lymphadenopathy CARDIAC: Well-healed left subclavian pacemaker site, RRR, paradoxically split S2, no murmurs, rubs, gallops RESPIRATORY:  Clear to auscultation without rales, wheezing or rhonchi  ABDOMEN: Soft, non-tender, non-distended MUSCULOSKELETAL:  No edema; No deformity  SKIN: Warm and dry NEUROLOGIC:  Alert and oriented x 3 PSYCHIATRIC:  Normal affect   ASSESSMENT:    No diagnosis found. PLAN:  In order of problems listed above:  1. 2nd deg AVB: He has 100% ventricular pacing because of this, but no evidence of heart failure. 2. PPM: Normal device function.  Remote downloads every 3 months and yearly office visits. 3. HTN: Excellent control 4. HLP: On statin.  Last LDL 61. 5. DM: Reports good control.  Followed by Dr. Joylene Draft. 6. CKD: Avoid nephrotoxic agents.     Medication Adjustments/Labs and Tests Ordered: Current medicines are reviewed at length with the patient today.  Concerns regarding medicines are outlined above.  No orders of the defined types were placed in this encounter.  No orders of the defined types were placed in this encounter.   There are no Patient Instructions on file for this visit.   Signed, Sanda Klein, MD  10/28/2018 9:32 AM    [facility]

## 2018-10-29 ENCOUNTER — Encounter: Payer: Self-pay | Admitting: Cardiovascular Disease

## 2018-11-25 LAB — CUP PACEART INCLINIC DEVICE CHECK
Date Time Interrogation Session: 20200131164842
Implantable Lead Implant Date: 20190925
Implantable Lead Implant Date: 20190925
Implantable Lead Location: 753860
MDC IDC LEAD LOCATION: 753859
MDC IDC PG IMPLANT DT: 20190925
MDC IDC PG SERIAL: 9066127
Pulse Gen Model: 2272

## 2018-12-23 ENCOUNTER — Other Ambulatory Visit: Payer: Self-pay | Admitting: Nephrology

## 2018-12-23 DIAGNOSIS — N281 Cyst of kidney, acquired: Secondary | ICD-10-CM

## 2018-12-23 DIAGNOSIS — N183 Chronic kidney disease, stage 3 unspecified: Secondary | ICD-10-CM

## 2018-12-23 DIAGNOSIS — E785 Hyperlipidemia, unspecified: Secondary | ICD-10-CM

## 2018-12-23 DIAGNOSIS — D631 Anemia in chronic kidney disease: Secondary | ICD-10-CM

## 2018-12-23 DIAGNOSIS — E1122 Type 2 diabetes mellitus with diabetic chronic kidney disease: Secondary | ICD-10-CM

## 2018-12-23 DIAGNOSIS — N2581 Secondary hyperparathyroidism of renal origin: Secondary | ICD-10-CM

## 2018-12-23 DIAGNOSIS — M1030 Gout due to renal impairment, unspecified site: Secondary | ICD-10-CM

## 2018-12-23 DIAGNOSIS — C679 Malignant neoplasm of bladder, unspecified: Secondary | ICD-10-CM

## 2019-01-09 ENCOUNTER — Ambulatory Visit
Admission: RE | Admit: 2019-01-09 | Discharge: 2019-01-09 | Disposition: A | Discharge status: Home / Self Care | Payer: Medicare Other | Source: Ambulatory Visit | Attending: Nephrology | Admitting: Nephrology

## 2019-01-09 ENCOUNTER — Other Ambulatory Visit: Payer: Self-pay

## 2019-01-09 DIAGNOSIS — N183 Chronic kidney disease, stage 3 unspecified: Secondary | ICD-10-CM

## 2019-01-09 DIAGNOSIS — E785 Hyperlipidemia, unspecified: Secondary | ICD-10-CM

## 2019-01-09 DIAGNOSIS — N281 Cyst of kidney, acquired: Secondary | ICD-10-CM

## 2019-01-09 DIAGNOSIS — E1122 Type 2 diabetes mellitus with diabetic chronic kidney disease: Secondary | ICD-10-CM

## 2019-01-09 DIAGNOSIS — M1030 Gout due to renal impairment, unspecified site: Secondary | ICD-10-CM

## 2019-01-09 DIAGNOSIS — N2581 Secondary hyperparathyroidism of renal origin: Secondary | ICD-10-CM

## 2019-01-09 DIAGNOSIS — C679 Malignant neoplasm of bladder, unspecified: Secondary | ICD-10-CM

## 2019-01-09 DIAGNOSIS — D631 Anemia in chronic kidney disease: Secondary | ICD-10-CM

## 2019-01-09 IMAGING — RF DG C-ARM 61-120 MIN
1 series · 2 of 2 positions shown · non-contrast
Comparison: 05/02/2018 MR.

CLINICAL DATA: 83-year-old male for L4-5 fusion. Subsequent
encounter.

EXAM:
LUMBAR SPINE - 2-3 VIEW; DG C-ARM 61-120 MIN

[Series 1: run · 2 of 2 slices shown]
[im 1/2]
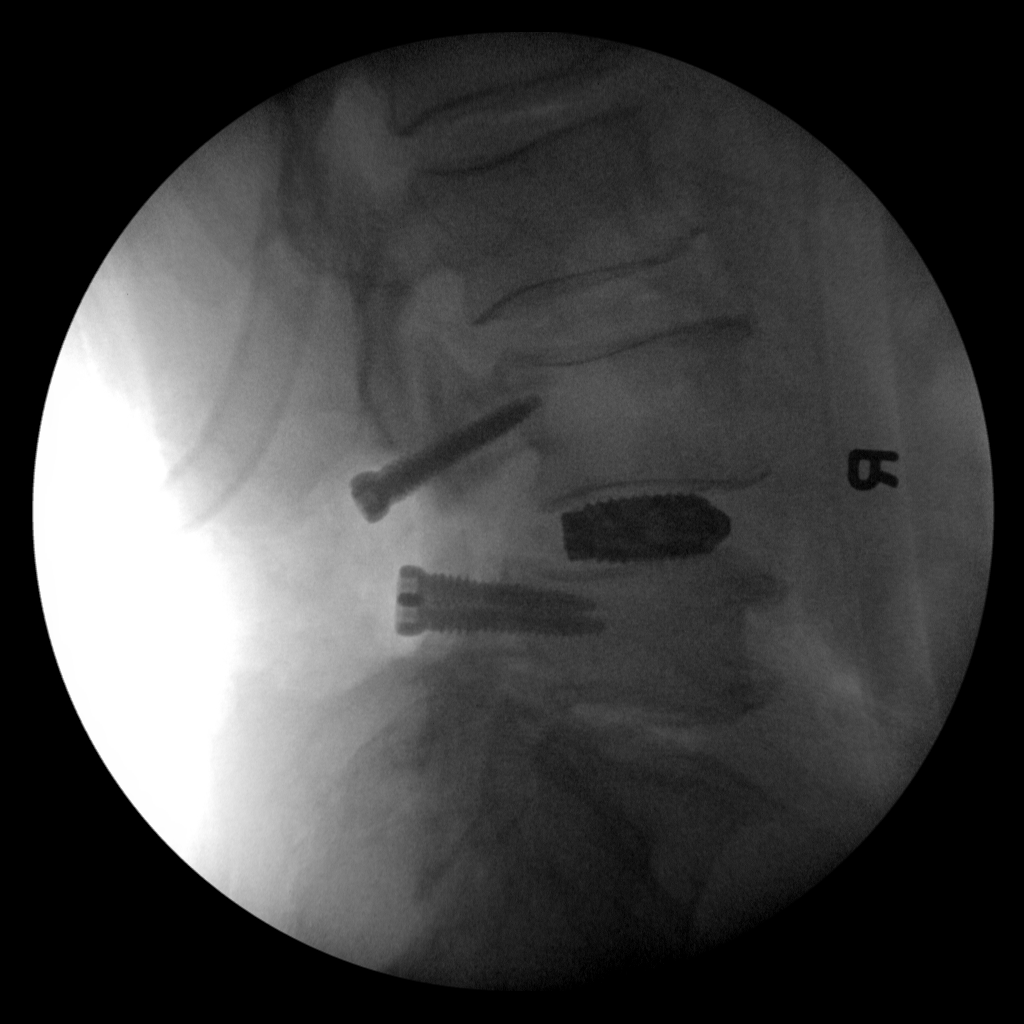
[im 2/2]
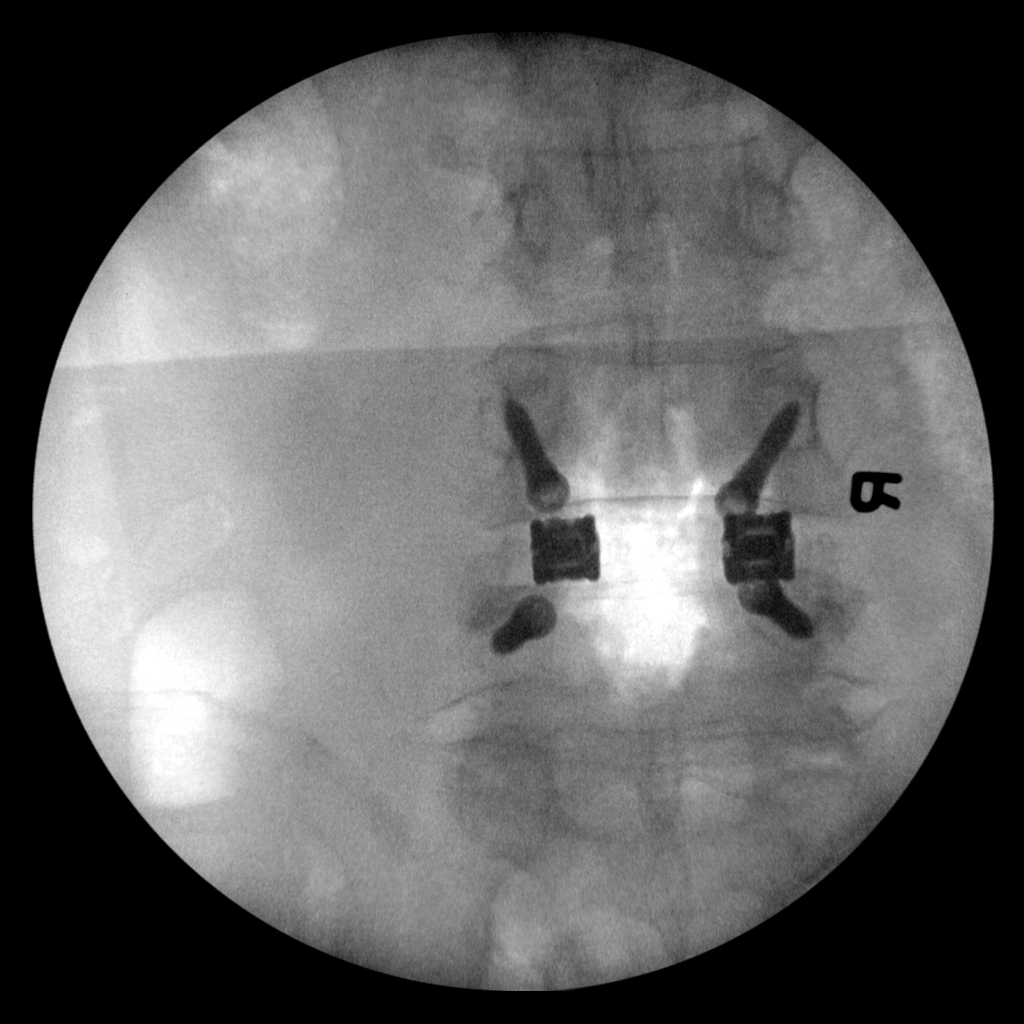

[2 of 2 positions shown; findings below may reference images not displayed]

FINDINGS: Two intraoperative C-arm views submitted for review after surgery.
Level assignment per prior MR with rudimentary disc at the S1-2
level. Mild anterior slip L4.

Fusion L4-5 level with pedicle screws and interbody spacers.
IMPRESSION: Fusion L4-5 as noted above.

## 2019-01-27 ENCOUNTER — Other Ambulatory Visit: Payer: Self-pay

## 2019-01-27 ENCOUNTER — Ambulatory Visit (INDEPENDENT_AMBULATORY_CARE_PROVIDER_SITE_OTHER): Payer: Medicare Other | Admitting: *Deleted

## 2019-01-27 DIAGNOSIS — I441 Atrioventricular block, second degree: Secondary | ICD-10-CM

## 2019-01-27 DIAGNOSIS — R001 Bradycardia, unspecified: Secondary | ICD-10-CM | POA: Diagnosis not present

## 2019-01-27 LAB — CUP PACEART REMOTE DEVICE CHECK
Battery Remaining Longevity: 112 mo
Battery Remaining Percentage: 95.5 %
Battery Voltage: 3.02 V
Brady Statistic AP VP Percent: 65 %
Brady Statistic AP VS Percent: 1 %
Brady Statistic AS VP Percent: 34 %
Brady Statistic AS VS Percent: 1 %
Brady Statistic RA Percent Paced: 65 %
Brady Statistic RV Percent Paced: 99 %
Date Time Interrogation Session: 20200403080015
Implantable Lead Implant Date: 20190925
Implantable Lead Implant Date: 20190925
Implantable Lead Location: 753859
Implantable Lead Location: 753860
Implantable Pulse Generator Implant Date: 20190925
Lead Channel Impedance Value: 440 Ohm
Lead Channel Impedance Value: 480 Ohm
Lead Channel Pacing Threshold Amplitude: 0.5 V
Lead Channel Pacing Threshold Amplitude: 0.625 V
Lead Channel Pacing Threshold Pulse Width: 0.5 ms
Lead Channel Pacing Threshold Pulse Width: 0.5 ms
Lead Channel Sensing Intrinsic Amplitude: 4.6 mV
Lead Channel Sensing Intrinsic Amplitude: 5 mV
Lead Channel Setting Pacing Amplitude: 0.875
Lead Channel Setting Pacing Amplitude: 1.5 V
Lead Channel Setting Pacing Pulse Width: 0.5 ms
Lead Channel Setting Sensing Sensitivity: 4 mV
Pulse Gen Model: 2272
Pulse Gen Serial Number: 9066127

## 2019-02-02 NOTE — Progress Notes (Signed)
Remote pacemaker transmission.   

## 2019-04-12 ENCOUNTER — Other Ambulatory Visit: Payer: Self-pay | Admitting: Internal Medicine

## 2019-04-12 DIAGNOSIS — N6320 Unspecified lump in the left breast, unspecified quadrant: Secondary | ICD-10-CM

## 2019-04-18 ENCOUNTER — Ambulatory Visit
Admission: RE | Admit: 2019-04-18 | Discharge: 2019-04-18 | Disposition: A | Discharge status: Home / Self Care | Payer: Medicare Other | Source: Ambulatory Visit | Attending: Internal Medicine | Admitting: Internal Medicine

## 2019-04-18 ENCOUNTER — Ambulatory Visit: Payer: Medicare Other

## 2019-04-18 ENCOUNTER — Other Ambulatory Visit: Payer: Self-pay

## 2019-04-18 DIAGNOSIS — N6320 Unspecified lump in the left breast, unspecified quadrant: Secondary | ICD-10-CM

## 2019-04-27 ENCOUNTER — Telehealth: Payer: Self-pay

## 2019-04-27 ENCOUNTER — Ambulatory Visit (INDEPENDENT_AMBULATORY_CARE_PROVIDER_SITE_OTHER): Payer: Medicare Other | Admitting: *Deleted

## 2019-04-27 DIAGNOSIS — R001 Bradycardia, unspecified: Secondary | ICD-10-CM | POA: Diagnosis not present

## 2019-04-27 NOTE — Telephone Encounter (Signed)
Left message for patient to remind of missed remote transmission.  

## 2019-04-30 LAB — CUP PACEART REMOTE DEVICE CHECK
Battery Remaining Longevity: 109 mo
Battery Remaining Percentage: 95.5 %
Battery Voltage: 3.02 V
Brady Statistic AP VP Percent: 70 %
Brady Statistic AP VS Percent: 1 %
Brady Statistic AS VP Percent: 29 %
Brady Statistic AS VS Percent: 1 %
Brady Statistic RA Percent Paced: 70 %
Brady Statistic RV Percent Paced: 99 %
Date Time Interrogation Session: 20200703080013
Implantable Lead Implant Date: 20190925
Implantable Lead Implant Date: 20190925
Implantable Lead Location: 753859
Implantable Lead Location: 753860
Implantable Pulse Generator Implant Date: 20190925
Lead Channel Impedance Value: 450 Ohm
Lead Channel Impedance Value: 460 Ohm
Lead Channel Pacing Threshold Amplitude: 0.5 V
Lead Channel Pacing Threshold Amplitude: 0.625 V
Lead Channel Pacing Threshold Pulse Width: 0.5 ms
Lead Channel Pacing Threshold Pulse Width: 0.5 ms
Lead Channel Sensing Intrinsic Amplitude: 4.5 mV
Lead Channel Sensing Intrinsic Amplitude: 4.5 mV
Lead Channel Setting Pacing Amplitude: 0.875
Lead Channel Setting Pacing Amplitude: 1.5 V
Lead Channel Setting Pacing Pulse Width: 0.5 ms
Lead Channel Setting Sensing Sensitivity: 4 mV
Pulse Gen Model: 2272
Pulse Gen Serial Number: 9066127

## 2019-05-04 ENCOUNTER — Encounter: Payer: Self-pay | Admitting: Cardiology

## 2019-05-04 NOTE — Progress Notes (Signed)
Remote pacemaker transmission.   

## 2019-07-27 ENCOUNTER — Ambulatory Visit (INDEPENDENT_AMBULATORY_CARE_PROVIDER_SITE_OTHER): Payer: Medicare Other | Admitting: *Deleted

## 2019-07-27 DIAGNOSIS — R001 Bradycardia, unspecified: Secondary | ICD-10-CM

## 2019-07-29 LAB — CUP PACEART REMOTE DEVICE CHECK
Battery Remaining Longevity: 115 mo
Battery Remaining Percentage: 95.5 %
Battery Voltage: 3.02 V
Brady Statistic AP VP Percent: 73 %
Brady Statistic AP VS Percent: 1 %
Brady Statistic AS VP Percent: 27 %
Brady Statistic AS VS Percent: 1 %
Brady Statistic RA Percent Paced: 73 %
Brady Statistic RV Percent Paced: 99 %
Date Time Interrogation Session: 20201002080014
Implantable Lead Implant Date: 20190925
Implantable Lead Implant Date: 20190925
Implantable Lead Location: 753859
Implantable Lead Location: 753860
Implantable Pulse Generator Implant Date: 20190925
Lead Channel Impedance Value: 460 Ohm
Lead Channel Impedance Value: 460 Ohm
Lead Channel Pacing Threshold Amplitude: 0.5 V
Lead Channel Pacing Threshold Amplitude: 0.625 V
Lead Channel Pacing Threshold Pulse Width: 0.5 ms
Lead Channel Pacing Threshold Pulse Width: 0.5 ms
Lead Channel Sensing Intrinsic Amplitude: 5 mV
Lead Channel Sensing Intrinsic Amplitude: 5.1 mV
Lead Channel Setting Pacing Amplitude: 0.875
Lead Channel Setting Pacing Amplitude: 1.5 V
Lead Channel Setting Pacing Pulse Width: 0.5 ms
Lead Channel Setting Sensing Sensitivity: 4 mV
Pulse Gen Model: 2272
Pulse Gen Serial Number: 9066127

## 2019-08-02 ENCOUNTER — Encounter: Payer: Self-pay | Admitting: Cardiology

## 2019-08-02 NOTE — Progress Notes (Signed)
Remote pacemaker transmission.   

## 2019-10-16 ENCOUNTER — Ambulatory Visit: Payer: Medicare Other | Attending: Internal Medicine

## 2019-10-16 DIAGNOSIS — Z20822 Contact with and (suspected) exposure to covid-19: Secondary | ICD-10-CM

## 2019-10-17 LAB — NOVEL CORONAVIRUS, NAA: SARS-CoV-2, NAA: NOT DETECTED

## 2019-10-26 ENCOUNTER — Ambulatory Visit (INDEPENDENT_AMBULATORY_CARE_PROVIDER_SITE_OTHER): Payer: Medicare Other | Admitting: *Deleted

## 2019-10-26 DIAGNOSIS — I441 Atrioventricular block, second degree: Secondary | ICD-10-CM | POA: Diagnosis not present

## 2019-10-27 LAB — CUP PACEART REMOTE DEVICE CHECK
Battery Remaining Longevity: 114 mo
Battery Remaining Percentage: 95.5 %
Battery Voltage: 3.02 V
Brady Statistic AP VP Percent: 75 %
Brady Statistic AP VS Percent: 1 %
Brady Statistic AS VP Percent: 25 %
Brady Statistic AS VS Percent: 1 %
Brady Statistic RA Percent Paced: 75 %
Brady Statistic RV Percent Paced: 99 %
Date Time Interrogation Session: 20210101040013
Implantable Lead Implant Date: 20190925
Implantable Lead Implant Date: 20190925
Implantable Lead Location: 753859
Implantable Lead Location: 753860
Implantable Pulse Generator Implant Date: 20190925
Lead Channel Impedance Value: 450 Ohm
Lead Channel Impedance Value: 460 Ohm
Lead Channel Pacing Threshold Amplitude: 0.625 V
Lead Channel Pacing Threshold Amplitude: 0.625 V
Lead Channel Pacing Threshold Pulse Width: 0.5 ms
Lead Channel Pacing Threshold Pulse Width: 0.5 ms
Lead Channel Sensing Intrinsic Amplitude: 4.3 mV
Lead Channel Sensing Intrinsic Amplitude: 4.3 mV
Lead Channel Setting Pacing Amplitude: 0.875
Lead Channel Setting Pacing Amplitude: 1.625
Lead Channel Setting Pacing Pulse Width: 0.5 ms
Lead Channel Setting Sensing Sensitivity: 4 mV
Pulse Gen Model: 2272
Pulse Gen Serial Number: 9066127

## 2019-11-09 ENCOUNTER — Other Ambulatory Visit: Payer: Self-pay | Admitting: Nephrology

## 2019-11-09 DIAGNOSIS — N183 Chronic kidney disease, stage 3 unspecified: Secondary | ICD-10-CM

## 2019-11-20 ENCOUNTER — Encounter: Payer: Self-pay | Admitting: Cardiovascular Disease

## 2019-11-20 ENCOUNTER — Ambulatory Visit (INDEPENDENT_AMBULATORY_CARE_PROVIDER_SITE_OTHER): Payer: Medicare Other | Admitting: Cardiovascular Disease

## 2019-11-20 ENCOUNTER — Other Ambulatory Visit: Payer: Self-pay

## 2019-11-20 ENCOUNTER — Encounter (INDEPENDENT_AMBULATORY_CARE_PROVIDER_SITE_OTHER): Payer: Self-pay

## 2019-11-20 VITALS — BP 120/68 | HR 73 | Ht 70.0 in | Wt 200.0 lb

## 2019-11-20 DIAGNOSIS — E1121 Type 2 diabetes mellitus with diabetic nephropathy: Secondary | ICD-10-CM

## 2019-11-20 DIAGNOSIS — I442 Atrioventricular block, complete: Secondary | ICD-10-CM

## 2019-11-20 DIAGNOSIS — Z950 Presence of cardiac pacemaker: Secondary | ICD-10-CM | POA: Diagnosis not present

## 2019-11-20 DIAGNOSIS — E7800 Pure hypercholesterolemia, unspecified: Secondary | ICD-10-CM | POA: Diagnosis not present

## 2019-11-20 DIAGNOSIS — Z794 Long term (current) use of insulin: Secondary | ICD-10-CM

## 2019-11-20 DIAGNOSIS — I10 Essential (primary) hypertension: Secondary | ICD-10-CM | POA: Diagnosis not present

## 2019-11-20 DIAGNOSIS — N1832 Chronic kidney disease, stage 3b: Secondary | ICD-10-CM

## 2019-11-20 NOTE — Patient Instructions (Signed)

## 2019-11-20 NOTE — Progress Notes (Signed)
Cardiology Office Note:    Date:  11/21/2019   ID:  Keith Drake, DOB [birth year], MRN [id number]  PCP:  Keith Infante, MD  Cardiologist:  Keith Klein, MD  Electrophysiologist:  None   Referring MD: Keith Infante, MD   Chief Complaint  Patient presents with  . Pacemaker Check    History of Present Illness:    Keith Drake is a 85 y.o. male with a hx of recurrent episodes of near syncope related to second-degree AV block Mobitz type I, who underwent implantation of a dual-chamber pacemaker in September 2019 ([place]).  Arrhythmia was detected when he was preparing to undergo lumbar spine surgery (spondylolisthesis L4-L5 with lumbar stenosis and neurogenic claudication).  He has not had any episodes of "near syncope due to low blood sugar" since his pacemaker was implanted.  Generally feels very well and is remarkably strong for his age.  Lives independently.  The patient specifically denies any chest pain at rest exertion, dyspnea at rest or with exertion, orthopnea, paroxysmal nocturnal dyspnea, syncope, palpitations, focal neurological deficits, intermittent claudication, lower extremity edema, unexplained weight gain, cough, hemoptysis or wheezing.  Pacemaker function is normal.  Estimated generator longevity is 9.3-9.9 years.  All lead parameters are excellent.  He presents today with AV sequential pacing and even when pacing is taken down to 30 bpm and AV delay is extended to maximum, there is no sensed ventricular beat.  He appears to not be pacemaker dependent.  He has 75% atrial pacing with a rather blunted heart rate histogram distribution.  He has 99% ventricular pacing.  Occasional episodes of brief paroxysmal atrial tachycardia are seen (but some of the mode switch episodes are due to competitive pacing) and there is no atrial fibrillation or ventricular tachycardia.    Sensor settings were made a little more aggressive (trigger changed from medium to fast and slope  increase from 8-9).  Past Medical History:  Diagnosis Date  . Anemia   . Arthritis    OCC LEG PAIN  . Bladder cancer ([place]) TCC   History of bladder carcinoma; S/P BCG TX'S ; "never treated for cancer; it was a spot on my bladder" (07/20/2018)  . CKD (chronic kidney disease), stage III   . History of gout    "on daily RX" (07/20/2018)  . History of kidney stones   . Hyperlipidemia   . Hypertension   . Impaired hearing BILATERAL    OCCASIONAL WEARS HEARING AIDS  . Mobitz I    Keith Drake 07/20/2018  . Pneumonia  ~ 1980s X 1  . Presence of permanent cardiac pacemaker 07/20/2018  . Right bundle branch block   . Spondylolisthesis   . Type II diabetes mellitus ([place])     Past Surgical History:  Procedure Laterality Date  . COLONOSCOPY  03/2004  . CYSTO / RESECTIONAL BLADDER BX'S  05-01-2011  . CYSTOSCOPY WITH BIOPSY  10/05/2011   Procedure: CYSTOSCOPY WITH BIOPSY;  Surgeon: Keith Jabs, MD;  Location: [facility];  Service: Urology;  Laterality: N/A;  BLADDER BIOPSY   . CYSTOSCOPY WITH LITHOLAPAXY    . EXCISIONAL HEMORRHOIDECTOMY  1980s  . INSERT / REPLACE / REMOVE PACEMAKER  07/20/2018   Implantation of new dual chamber permanent pacemaker  . PACEMAKER IMPLANT N/A 07/20/2018   Procedure: PACEMAKER IMPLANT;  Surgeon: Keith Klein, MD;  Location: [facility];  Service: Cardiovascular;  Laterality: N/A;  . REFRACTIVE SURGERY Bilateral   . TRANSURETHRAL RESECTION OF BLADDER TUMOR  03-02-2011  .  US ECHOCARDIOGRAPHY  07-11-2009   EF 55-60%    Current Medications: Current Meds  Medication Sig  . acetaminophen (TYLENOL) 500 MG tablet Take 1,000 mg by mouth every 6 (six) hours as needed for mild pain.  Marland Kitchen allopurinol (ZYLOPRIM) 100 MG tablet Take 100 mg by mouth daily.  Marland Kitchen amLODipine (NORVASC) 10 MG tablet Take 10 mg by mouth daily.   Marland Kitchen aspirin 81 MG tablet Take 81 mg by mouth daily.   Marland Kitchen HUMULIN 70/30 (70-30) 100 UNIT/ML injection Inject 15-40 Units into the skin 2  (two) times daily with a meal. Inject 40 units with breakfast and 15 units with supper.  . hydrochlorothiazide (HYDRODIURIL) 25 MG tablet Take 25 mg by mouth daily.  . minocycline (MINOCIN,DYNACIN) 100 MG capsule Take 100 mg by mouth daily.   Marland Kitchen oxyCODONE 10 MG TABS Take 1 tablet (10 mg total) by mouth every 3 (three) hours as needed for severe pain ((score 7 to 10)).  Marland Kitchen rosuvastatin (CRESTOR) 40 MG tablet Take 40 mg by mouth daily.      Allergies:   Patient has no known allergies.   Social History   Socioeconomic History  . Marital status: Divorced    Spouse name: Not on file  . Number of children: Not on file  . Years of education: Not on file  . Highest education level: Not on file  Occupational History  . Not on file  Tobacco Use  . Smoking status: Current Some Day Smoker    Packs/day: 0.10    Years: 30.00    Pack years: 3.00    Types: Cigars  . Smokeless tobacco: Never Used  Substance and Sexual Activity  . Alcohol use: Not Currently    Comment: 08/12/18 "1 beer/year"  . Drug use: Never  . Sexual activity: Not Currently  Other Topics Concern  . Not on file  Social History Narrative  . Not on file   Social Determinants of Health   Financial Resource Strain:   . Difficulty of Paying Living Expenses: Not on file  Food Insecurity:   . Worried About Charity fundraiser in the Last Year: Not on file  . Ran Out of Food in the Last Year: Not on file  Transportation Needs:   . Lack of Transportation (Medical): Not on file  . Lack of Transportation (Non-Medical): Not on file  Physical Activity:   . Days of Exercise per Week: Not on file  . Minutes of Exercise per Session: Not on file  Stress:   . Feeling of Stress : Not on file  Social Connections:   . Frequency of Communication with Friends and Family: Not on file  . Frequency of Social Gatherings with Friends and Family: Not on file  . Attends Religious Services: Not on file  . Active Member of Clubs or  Organizations: Not on file  . Attends Archivist Meetings: Not on file  . Marital Status: Not on file     Family History: The patient's family history includes Cancer in his brother and brother; Colon cancer in his brother; Diabetes in his sister.  ROS:   Please see the history of present illness.    All other systems are reviewed and are negative.   EKGs/Labs/Other Studies Reviewed:    The following studies were reviewed today: Comprehensive pacemaker check performed by me today  EKG:  EKG is  ordered today.  It shows AV sequential pacing.  Recent Labs: No results found for requested labs within  last 8760 hours.   10/24/2019 hemoglobin 14.1, creatinine 2.1, hemoglobin A1c 8.3%, AST 52, TSH 2.41 Recent Lipid Panel    Component Value Date/Time   CHOL 115 07/20/2018 1012   TRIG 133 07/20/2018 1012   HDL 27 (L) 07/20/2018 1012   CHOLHDL 4.3 07/20/2018 1012   VLDL 27 07/20/2018 1012   LDLCALC 61 07/20/2018 1012   10/24/2019 total cholesterol 125, HDL 27, LDL 60, triglycerides 190  Physical Exam:    VS:  BP 120/68   Pulse 73   Ht 5\' 10"  (1.778 m)   Wt 200 lb (90.7 kg)   BMI 28.70 kg/m     Wt Readings from Last 3 Encounters:  11/20/19 200 lb (90.7 kg)  10/28/18 196 lb (88.9 kg)  08/15/18 200 lb (90.7 kg)     General: Alert, oriented x3, no distress, appears strong for his age, mildly overweight, healthy left subclavian pacemaker site Head: no evidence of trauma, PERRL, EOMI, no exophtalmos or lid lag, no myxedema, no xanthelasma; normal ears, nose and oropharynx Neck: normal jugular venous pulsations and no hepatojugular reflux; brisk carotid pulses without delay and no carotid bruits Chest: clear to auscultation, no signs of consolidation by percussion or palpation, normal fremitus, symmetrical and full respiratory excursions Cardiovascular: normal position and quality of the apical impulse, regular rhythm, normal first and paradoxically split second heart  sounds, no murmurs, rubs or gallops Abdomen: no tenderness or distention, no masses by palpation, no abnormal pulsatility or arterial bruits, normal bowel sounds, no hepatosplenomegaly Extremities: no clubbing, cyanosis or edema; 2+ radial, ulnar and brachial pulses bilaterally; 2+ right femoral, posterior tibial and dorsalis pedis pulses; 2+ left femoral, posterior tibial and dorsalis pedis pulses; no subclavian or femoral bruits Neurological: grossly nonfocal Psych: Normal mood and affect   ASSESSMENT:    1. CHB (complete heart block) (HCC)   2. Pacemaker   3. Essential hypertension   4. Hypercholesterolemia   5. Type 2 diabetes mellitus with stage 3b chronic kidney disease, with long-term current use of insulin (HCC)   6. Stage 3b chronic kidney disease    PLAN:    In order of problems listed above:  1. 3rd deg AVB: It appears she has progressed to complete heart block, now pacemaker dependent.  No evidence of heart failure despite 100% ventricular paced rhythm. 2. PPM: Normal device function.  Remote downloads every 3 months and at least yearly office visits. 3. HTN: Very good control.   4. HLP: Excellent LDL, but he has a low HDL and elevated triglycerides, consistent with insulin resistance, compounded by less than adequate glycemic control.  Continue statin. 5. DM: Hemoglobin A1c has deteriorated slightly to 8.3%.  On insulin.  Followed by Dr. Joylene Draft. 6. CKD 3b: Avoid nephrotoxic agents.  Creatinine clearance stable at just above 30.   Medication Adjustments/Labs and Tests Ordered: Current medicines are reviewed at length with the patient today.  Concerns regarding medicines are outlined above.  Orders Placed This Encounter  Procedures  . EKG 12-Lead   No orders of the defined types were placed in this encounter.   Patient Instructions  Medication Instructions:  No changes *If you need a refill on your cardiac medications before your next appointment, please call your  pharmacy*  Lab Work: None ordered If you have labs (blood work) drawn today and your tests are completely normal, you will receive your results only by: Marland Kitchen MyChart Message (if you have MyChart) OR . A paper copy in the mail If you  have any lab test that is abnormal or we need to change your treatment, we will call you to review the results.  Testing/Procedures: None ordered  Follow-Up: At [facility], you and your health needs are our priority.  As part of our continuing mission to provide you with exceptional heart care, we have created designated Provider Care Teams.  These Care Teams include your primary Cardiologist (physician) and Advanced Practice Providers (APPs -  Physician Assistants and Nurse Practitioners) who all work together to provide you with the care you need, when you need it.  Your next appointment:   12 month(s)  The format for your next appointment:   In Person  Provider:   Sanda Klein, MD     Signed, Keith Klein, MD  11/21/2019 4:40 PM    [place]

## 2019-11-21 ENCOUNTER — Ambulatory Visit
Admission: RE | Admit: 2019-11-21 | Discharge: 2019-11-21 | Disposition: A | Discharge status: Home / Self Care | Payer: Medicare Other | Source: Ambulatory Visit | Attending: Nephrology | Admitting: Nephrology

## 2019-11-21 DIAGNOSIS — N183 Chronic kidney disease, stage 3 unspecified: Secondary | ICD-10-CM

## 2019-11-27 ENCOUNTER — Other Ambulatory Visit: Payer: Self-pay | Admitting: Cardiovascular Disease

## 2019-12-22 ENCOUNTER — Ambulatory Visit: Payer: Medicare Other | Attending: Internal Medicine

## 2019-12-22 DIAGNOSIS — Z23 Encounter for immunization: Secondary | ICD-10-CM | POA: Insufficient documentation

## 2019-12-22 NOTE — Progress Notes (Signed)
   Covid-19 Vaccination Clinic  Name:  Keith Drake    MRN: [id number] DOB: February 02, 1935  12/22/2019  [name] was observed post Covid-19 immunization for 15 minutes without incidence. He was provided with Vaccine Information Sheet and instruction to access the V-Safe system.   [name] was instructed to call 911 with any severe reactions post vaccine: Marland Kitchen Difficulty breathing  . Swelling of your face and throat  . A fast heartbeat  . A bad rash all over your body  . Dizziness and weakness    Immunizations Administered    Name Date Dose VIS Date Route   Pfizer COVID-19 Vaccine 12/22/2019  8:20 AM 0.3 mL 10/06/2019 Intramuscular   Manufacturer: [place]   Lot: [id number]   [place]: [id number]

## 2020-01-16 ENCOUNTER — Ambulatory Visit: Payer: Medicare Other | Attending: Internal Medicine

## 2020-01-16 DIAGNOSIS — Z23 Encounter for immunization: Secondary | ICD-10-CM

## 2020-01-16 NOTE — Progress Notes (Signed)
   Covid-19 Vaccination Clinic  Name:  Kaj Vasil    MRN: [id number] DOB: July 15, 1935  01/16/2020  Mr. Funes was observed post Covid-19 immunization for 15 minutes without incident. He was provided with Vaccine Information Sheet and instruction to access the V-Safe system.   Mr. Regas was instructed to call 911 with any severe reactions post vaccine: Marland Kitchen Difficulty breathing  . Swelling of face and throat  . A fast heartbeat  . A bad rash all over body  . Dizziness and weakness   Immunizations Administered    Name Date Dose VIS Date Route   Pfizer COVID-19 Vaccine 01/16/2020  9:33 AM 0.3 mL 10/06/2019 Intramuscular   Manufacturer: [place]   Lot: [id number]   [place]: [id number]

## 2020-01-25 ENCOUNTER — Ambulatory Visit (INDEPENDENT_AMBULATORY_CARE_PROVIDER_SITE_OTHER): Payer: Medicare Other | Admitting: *Deleted

## 2020-01-25 DIAGNOSIS — I441 Atrioventricular block, second degree: Secondary | ICD-10-CM | POA: Diagnosis not present

## 2020-01-25 LAB — CUP PACEART REMOTE DEVICE CHECK
Battery Remaining Longevity: 115 mo
Battery Remaining Percentage: 95.5 %
Battery Voltage: 3.02 V
Brady Statistic AP VP Percent: 66 %
Brady Statistic AP VS Percent: 1 %
Brady Statistic AS VP Percent: 33 %
Brady Statistic AS VS Percent: 1 %
Brady Statistic RA Percent Paced: 66 %
Brady Statistic RV Percent Paced: 99 %
Date Time Interrogation Session: 20210401020016
Implantable Lead Implant Date: 20190925
Implantable Lead Implant Date: 20190925
Implantable Lead Location: 753859
Implantable Lead Location: 753860
Implantable Pulse Generator Implant Date: 20190925
Lead Channel Impedance Value: 450 Ohm
Lead Channel Impedance Value: 450 Ohm
Lead Channel Pacing Threshold Amplitude: 0.625 V
Lead Channel Pacing Threshold Amplitude: 0.625 V
Lead Channel Pacing Threshold Pulse Width: 0.5 ms
Lead Channel Pacing Threshold Pulse Width: 0.5 ms
Lead Channel Sensing Intrinsic Amplitude: 4.8 mV
Lead Channel Sensing Intrinsic Amplitude: 4.8 mV
Lead Channel Setting Pacing Amplitude: 0.875
Lead Channel Setting Pacing Amplitude: 1.625
Lead Channel Setting Pacing Pulse Width: 0.5 ms
Lead Channel Setting Sensing Sensitivity: 4 mV
Pulse Gen Model: 2272
Pulse Gen Serial Number: 9066127

## 2020-01-25 NOTE — Progress Notes (Signed)
PPM Remote  

## 2020-04-16 IMAGING — US US RENAL
1 series · 14 of 25 positions shown · non-contrast
Comparison: 01/09/2019

CLINICAL DATA: Stage III chronic kidney disease, diabetes mellitus,
hypertension

EXAM:
RENAL / URINARY TRACT ULTRASOUND COMPLETE

[Series 1: us renal · 0.23mm/px · 50 acquisitions, 14 frames shown]
[im 1/50]
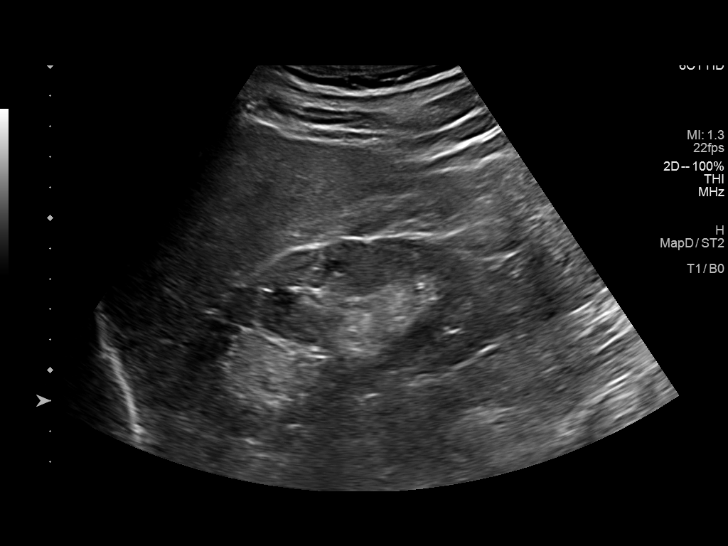
[im 5/50]
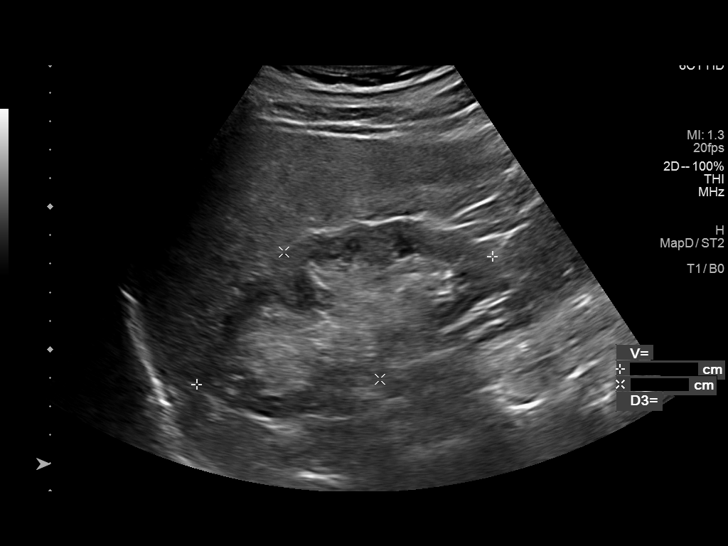
[im 9/50]
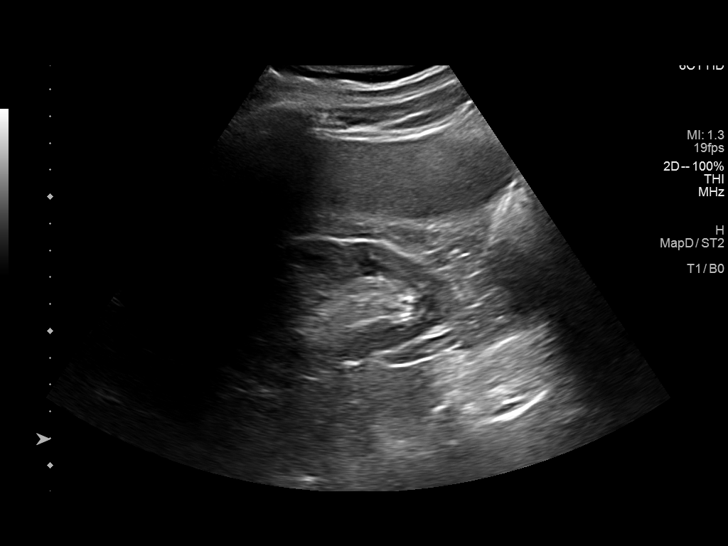
[im 13/50]
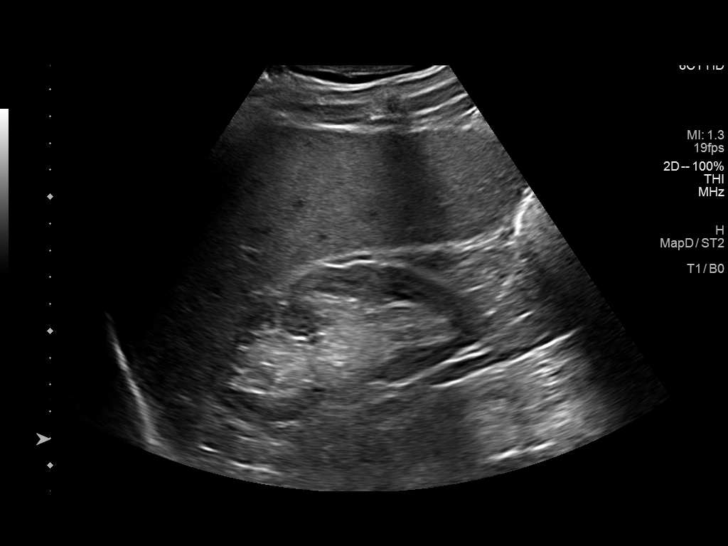
[im 17/50]
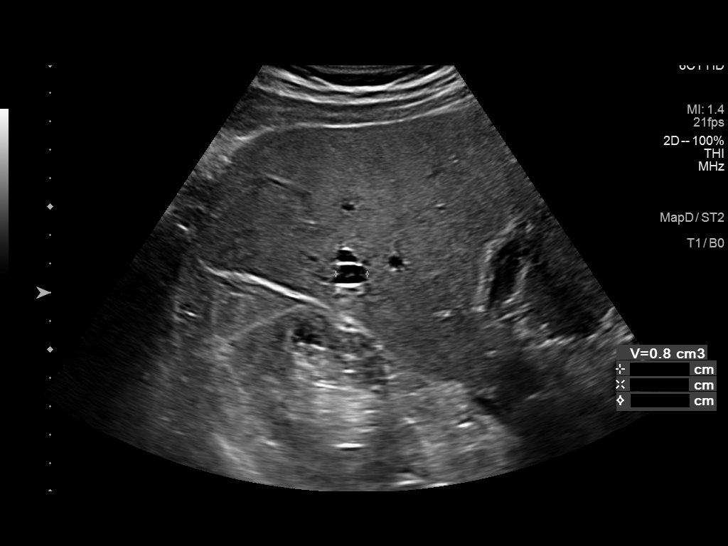
[im 19/50]
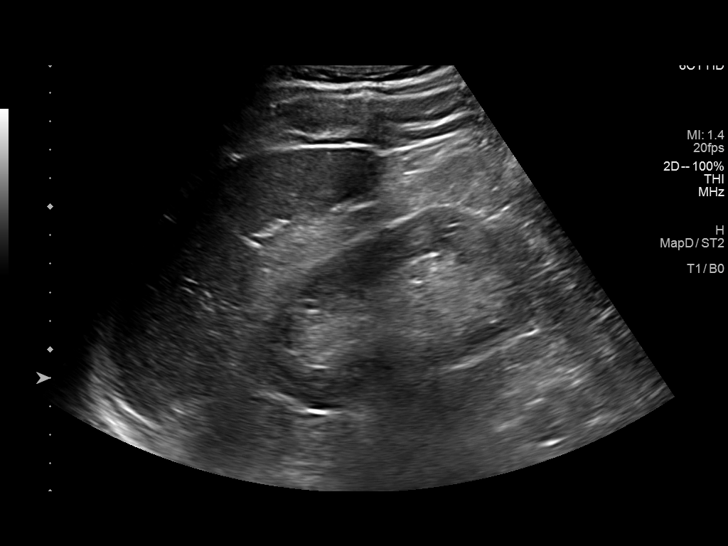
[im 23/50]
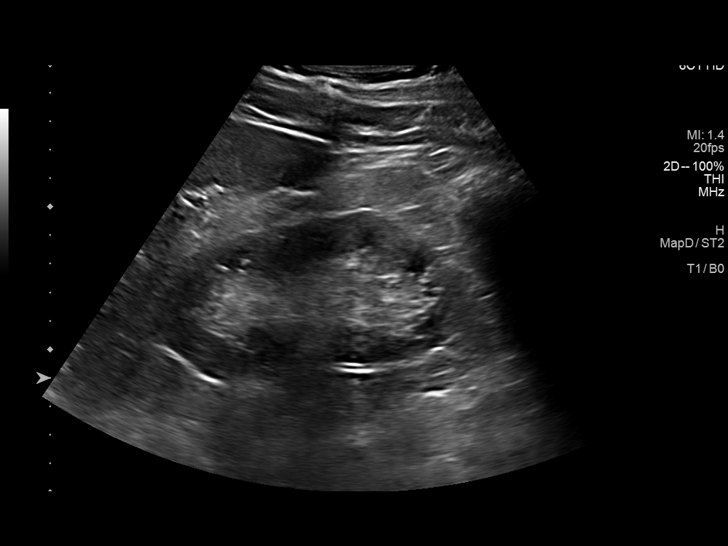
[im 27/50]
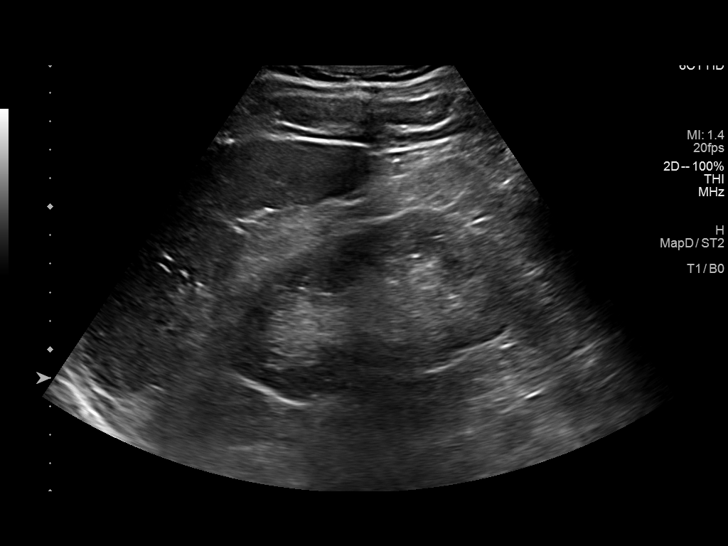
[im 31/50]
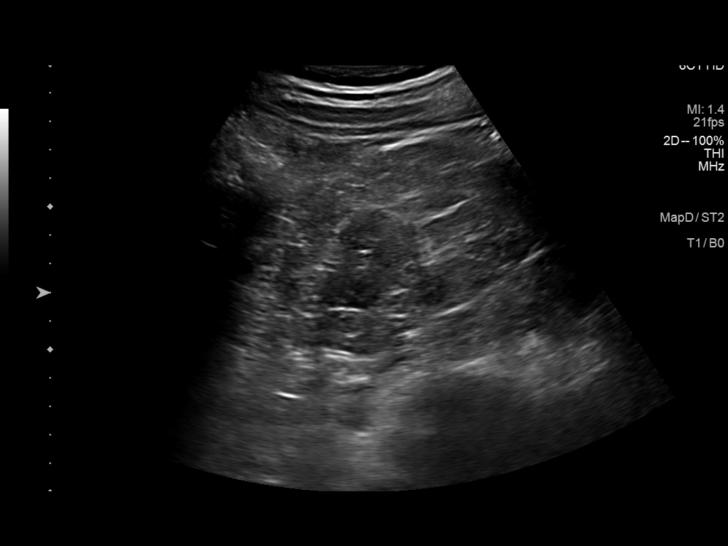
[im 33/50]
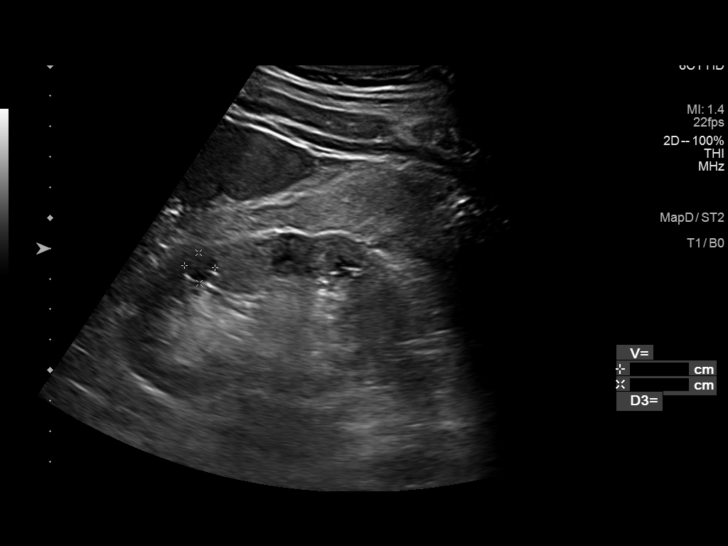
[im 37/50]
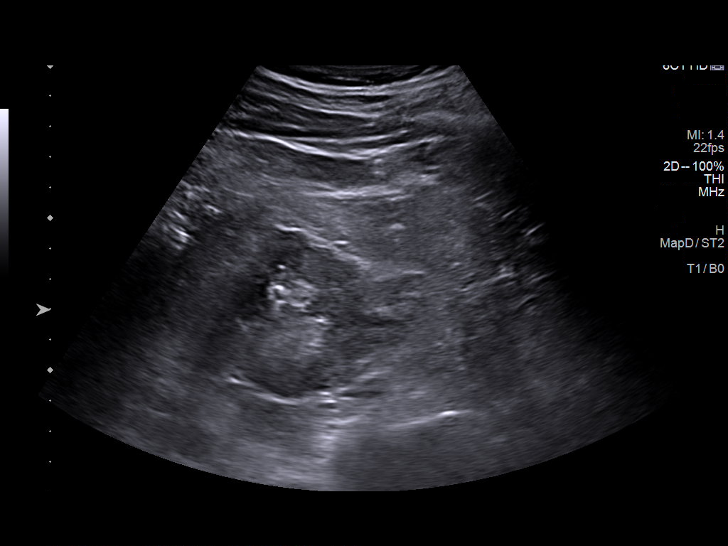
[im 41/50]
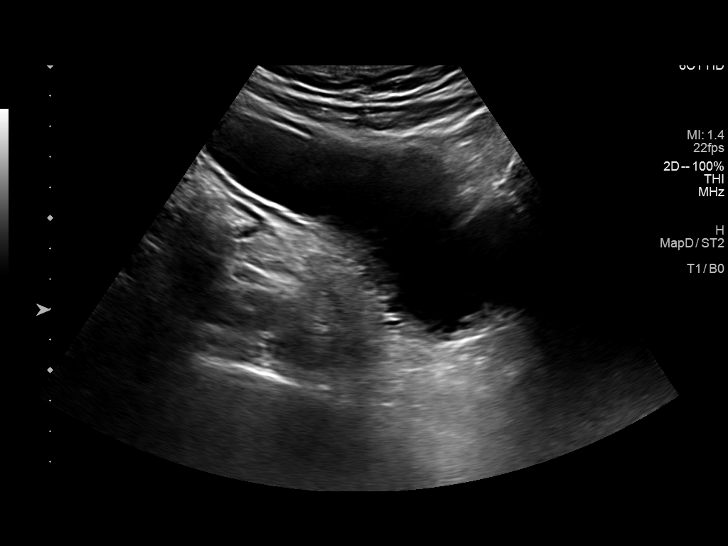
[im 45/50]
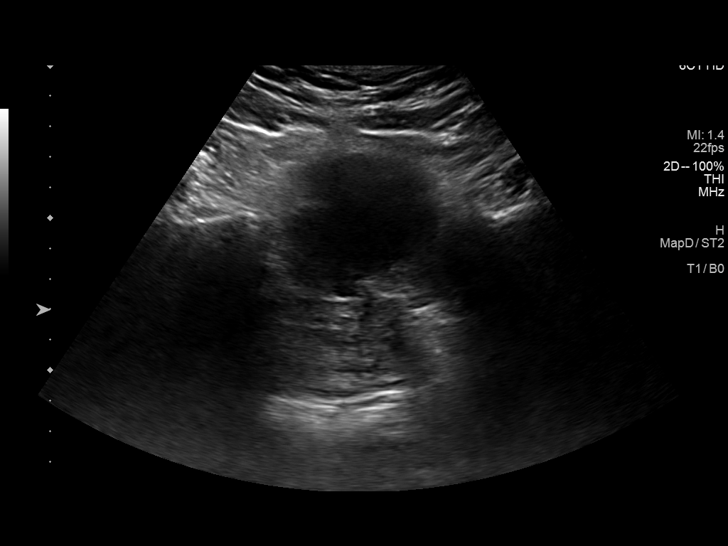
[im 50/50]
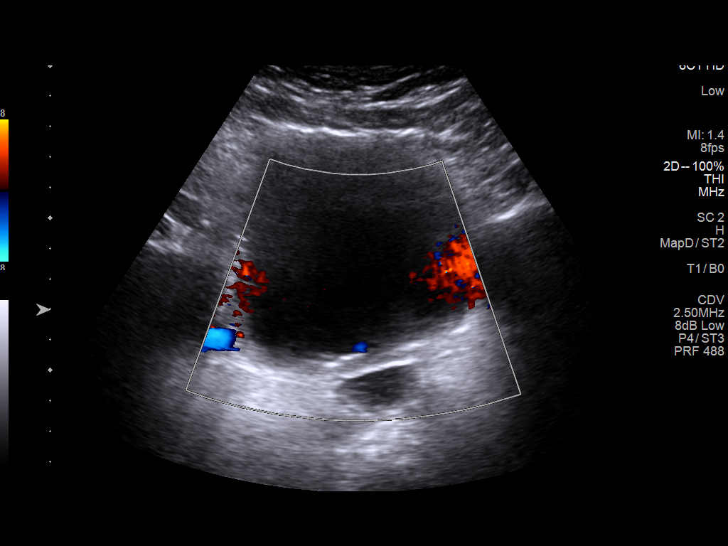

[14 of 25 positions shown; findings below may reference images not displayed]

FINDINGS: Right Kidney:

Renal measurements: 11.3 x 5.6 x 5.1 cm = volume: 167 mL. Cortical
thinning. Increased cortical echogenicity. No mass, hydronephrosis
or shadowing calcification.

Left Kidney:

Renal measurements: 11.3 x 5.1 x 4.8 cm = volume: 144 mL. Cortical
thinning. Upper normal cortical echogenicity. Question small cyst at
upper pole 10 x 10 x 7 mm, question Hang take containing a small
septation, decreased in size since previous exam. No additional
masses, hydronephrosis or shadowing calcification.

Bladder:

Appears normal for degree of bladder distention. No definite
abnormalities

Other:

N/A
IMPRESSION: BILATERAL renal cortical thinning with question medical renal
disease changes.

Decrease in size of a minimally complicated cyst at the upper to mid
LEFT kidney 10 mm diameter.

## 2020-04-25 ENCOUNTER — Other Ambulatory Visit: Payer: Self-pay

## 2020-04-25 ENCOUNTER — Ambulatory Visit (INDEPENDENT_AMBULATORY_CARE_PROVIDER_SITE_OTHER): Payer: Medicare Other | Admitting: *Deleted

## 2020-04-25 DIAGNOSIS — I441 Atrioventricular block, second degree: Secondary | ICD-10-CM | POA: Diagnosis not present

## 2020-04-25 LAB — CUP PACEART REMOTE DEVICE CHECK
Battery Remaining Longevity: 116 mo
Battery Remaining Percentage: 95.5 %
Battery Voltage: 3.02 V
Brady Statistic AP VP Percent: 63 %
Brady Statistic AP VS Percent: 1 %
Brady Statistic AS VP Percent: 35 %
Brady Statistic AS VS Percent: 1.1 %
Brady Statistic RA Percent Paced: 63 %
Brady Statistic RV Percent Paced: 98 %
Date Time Interrogation Session: 20210701020012
Implantable Lead Implant Date: 20190925
Implantable Lead Implant Date: 20190925
Implantable Lead Location: 753859
Implantable Lead Location: 753860
Implantable Pulse Generator Implant Date: 20190925
Lead Channel Impedance Value: 460 Ohm
Lead Channel Impedance Value: 480 Ohm
Lead Channel Pacing Threshold Amplitude: 0.5 V
Lead Channel Pacing Threshold Amplitude: 0.625 V
Lead Channel Pacing Threshold Pulse Width: 0.5 ms
Lead Channel Pacing Threshold Pulse Width: 0.5 ms
Lead Channel Sensing Intrinsic Amplitude: 4.6 mV
Lead Channel Sensing Intrinsic Amplitude: 5 mV
Lead Channel Setting Pacing Amplitude: 0.875
Lead Channel Setting Pacing Amplitude: 1.5 V
Lead Channel Setting Pacing Pulse Width: 0.5 ms
Lead Channel Setting Sensing Sensitivity: 4 mV
Pulse Gen Model: 2272
Pulse Gen Serial Number: 9066127

## 2020-04-26 NOTE — Progress Notes (Signed)
Remote pacemaker transmission.   

## 2020-07-18 ENCOUNTER — Other Ambulatory Visit: Payer: Self-pay | Admitting: Nephrology

## 2020-07-18 DIAGNOSIS — N184 Chronic kidney disease, stage 4 (severe): Secondary | ICD-10-CM

## 2020-07-25 ENCOUNTER — Ambulatory Visit (INDEPENDENT_AMBULATORY_CARE_PROVIDER_SITE_OTHER): Payer: Medicare Other

## 2020-07-25 DIAGNOSIS — I441 Atrioventricular block, second degree: Secondary | ICD-10-CM | POA: Diagnosis not present

## 2020-07-27 LAB — CUP PACEART REMOTE DEVICE CHECK
Battery Remaining Longevity: 118 mo
Battery Remaining Percentage: 95.5 %
Battery Voltage: 3.01 V
Brady Statistic AP VP Percent: 64 %
Brady Statistic AP VS Percent: 1 %
Brady Statistic AS VP Percent: 34 %
Brady Statistic AS VS Percent: 1.3 %
Brady Statistic RA Percent Paced: 64 %
Brady Statistic RV Percent Paced: 98 %
Date Time Interrogation Session: 20210930020014
Implantable Lead Implant Date: 20190925
Implantable Lead Implant Date: 20190925
Implantable Lead Location: 753859
Implantable Lead Location: 753860
Implantable Pulse Generator Implant Date: 20190925
Lead Channel Impedance Value: 450 Ohm
Lead Channel Impedance Value: 510 Ohm
Lead Channel Pacing Threshold Amplitude: 0.5 V
Lead Channel Pacing Threshold Amplitude: 0.625 V
Lead Channel Pacing Threshold Pulse Width: 0.5 ms
Lead Channel Pacing Threshold Pulse Width: 0.5 ms
Lead Channel Sensing Intrinsic Amplitude: 4 mV
Lead Channel Sensing Intrinsic Amplitude: 5 mV
Lead Channel Setting Pacing Amplitude: 0.875
Lead Channel Setting Pacing Amplitude: 1.5 V
Lead Channel Setting Pacing Pulse Width: 0.5 ms
Lead Channel Setting Sensing Sensitivity: 4 mV
Pulse Gen Model: 2272
Pulse Gen Serial Number: 9066127

## 2020-07-29 NOTE — Progress Notes (Signed)
Remote pacemaker transmission.   

## 2020-10-24 ENCOUNTER — Ambulatory Visit (INDEPENDENT_AMBULATORY_CARE_PROVIDER_SITE_OTHER): Payer: Medicare Other

## 2020-10-24 DIAGNOSIS — I441 Atrioventricular block, second degree: Secondary | ICD-10-CM | POA: Diagnosis not present

## 2020-10-24 LAB — CUP PACEART REMOTE DEVICE CHECK
Battery Remaining Longevity: 115 mo
Battery Remaining Percentage: 95.5 %
Battery Voltage: 3.01 V
Brady Statistic AP VP Percent: 64 %
Brady Statistic AP VS Percent: 1 %
Brady Statistic AS VP Percent: 34 %
Brady Statistic AS VS Percent: 1.1 %
Brady Statistic RA Percent Paced: 65 %
Brady Statistic RV Percent Paced: 98 %
Date Time Interrogation Session: 20211230020015
Implantable Lead Implant Date: 20190925
Implantable Lead Implant Date: 20190925
Implantable Lead Location: 753859
Implantable Lead Location: 753860
Implantable Pulse Generator Implant Date: 20190925
Lead Channel Impedance Value: 410 Ohm
Lead Channel Impedance Value: 480 Ohm
Lead Channel Pacing Threshold Amplitude: 0.5 V
Lead Channel Pacing Threshold Amplitude: 0.625 V
Lead Channel Pacing Threshold Pulse Width: 0.5 ms
Lead Channel Pacing Threshold Pulse Width: 0.5 ms
Lead Channel Sensing Intrinsic Amplitude: 4.8 mV
Lead Channel Sensing Intrinsic Amplitude: 5 mV
Lead Channel Setting Pacing Amplitude: 0.875
Lead Channel Setting Pacing Amplitude: 1.5 V
Lead Channel Setting Pacing Pulse Width: 0.5 ms
Lead Channel Setting Sensing Sensitivity: 4 mV
Pulse Gen Model: 2272
Pulse Gen Serial Number: 9066127

## 2020-11-07 NOTE — Progress Notes (Signed)
Remote pacemaker transmission.   

## 2021-01-23 ENCOUNTER — Other Ambulatory Visit: Payer: Self-pay

## 2021-01-23 ENCOUNTER — Ambulatory Visit (INDEPENDENT_AMBULATORY_CARE_PROVIDER_SITE_OTHER): Payer: Medicare Other

## 2021-01-23 DIAGNOSIS — I441 Atrioventricular block, second degree: Secondary | ICD-10-CM

## 2021-01-23 LAB — CUP PACEART REMOTE DEVICE CHECK
Battery Remaining Longevity: 116 mo
Battery Remaining Percentage: 95.5 %
Battery Voltage: 3.01 V
Brady Statistic AP VP Percent: 65 %
Brady Statistic AP VS Percent: 1 %
Brady Statistic AS VP Percent: 34 %
Brady Statistic AS VS Percent: 1 %
Brady Statistic RA Percent Paced: 65 %
Brady Statistic RV Percent Paced: 99 %
Date Time Interrogation Session: 20220331020013
Implantable Lead Implant Date: 20190925
Implantable Lead Implant Date: 20190925
Implantable Lead Location: 753859
Implantable Lead Location: 753860
Implantable Pulse Generator Implant Date: 20190925
Lead Channel Impedance Value: 440 Ohm
Lead Channel Impedance Value: 510 Ohm
Lead Channel Pacing Threshold Amplitude: 0.625 V
Lead Channel Pacing Threshold Amplitude: 0.75 V
Lead Channel Pacing Threshold Pulse Width: 0.5 ms
Lead Channel Pacing Threshold Pulse Width: 0.5 ms
Lead Channel Sensing Intrinsic Amplitude: 4.6 mV
Lead Channel Sensing Intrinsic Amplitude: 4.7 mV
Lead Channel Setting Pacing Amplitude: 1 V
Lead Channel Setting Pacing Amplitude: 1.625
Lead Channel Setting Pacing Pulse Width: 0.5 ms
Lead Channel Setting Sensing Sensitivity: 4 mV
Pulse Gen Model: 2272
Pulse Gen Serial Number: 9066127

## 2021-02-04 NOTE — Progress Notes (Signed)
Remote pacemaker transmission.   

## 2021-03-10 ENCOUNTER — Emergency Department (HOSPITAL_BASED_OUTPATIENT_CLINIC_OR_DEPARTMENT_OTHER): Payer: Medicare Other

## 2021-03-10 ENCOUNTER — Other Ambulatory Visit: Payer: Self-pay

## 2021-03-10 ENCOUNTER — Emergency Department (HOSPITAL_BASED_OUTPATIENT_CLINIC_OR_DEPARTMENT_OTHER)
Admission: EM | Admit: 2021-03-10 | Discharge: 2021-03-10 | Disposition: A | Discharge status: Home / Self Care | Payer: Medicare Other | Attending: Emergency Medicine | Admitting: Emergency Medicine

## 2021-03-10 ENCOUNTER — Encounter (HOSPITAL_BASED_OUTPATIENT_CLINIC_OR_DEPARTMENT_OTHER): Payer: Self-pay

## 2021-03-10 DIAGNOSIS — Z794 Long term (current) use of insulin: Secondary | ICD-10-CM | POA: Diagnosis not present

## 2021-03-10 DIAGNOSIS — R531 Weakness: Secondary | ICD-10-CM | POA: Diagnosis not present

## 2021-03-10 DIAGNOSIS — F17290 Nicotine dependence, other tobacco product, uncomplicated: Secondary | ICD-10-CM | POA: Diagnosis not present

## 2021-03-10 DIAGNOSIS — Z79899 Other long term (current) drug therapy: Secondary | ICD-10-CM | POA: Insufficient documentation

## 2021-03-10 DIAGNOSIS — R0602 Shortness of breath: Secondary | ICD-10-CM | POA: Insufficient documentation

## 2021-03-10 DIAGNOSIS — Z8551 Personal history of malignant neoplasm of bladder: Secondary | ICD-10-CM | POA: Insufficient documentation

## 2021-03-10 DIAGNOSIS — R0789 Other chest pain: Secondary | ICD-10-CM | POA: Diagnosis not present

## 2021-03-10 DIAGNOSIS — N183 Chronic kidney disease, stage 3 unspecified: Secondary | ICD-10-CM | POA: Diagnosis not present

## 2021-03-10 DIAGNOSIS — E119 Type 2 diabetes mellitus without complications: Secondary | ICD-10-CM | POA: Diagnosis not present

## 2021-03-10 DIAGNOSIS — Z7982 Long term (current) use of aspirin: Secondary | ICD-10-CM | POA: Diagnosis not present

## 2021-03-10 DIAGNOSIS — I129 Hypertensive chronic kidney disease with stage 1 through stage 4 chronic kidney disease, or unspecified chronic kidney disease: Secondary | ICD-10-CM | POA: Insufficient documentation

## 2021-03-10 DIAGNOSIS — R079 Chest pain, unspecified: Secondary | ICD-10-CM

## 2021-03-10 DIAGNOSIS — Z950 Presence of cardiac pacemaker: Secondary | ICD-10-CM | POA: Diagnosis not present

## 2021-03-10 DIAGNOSIS — R42 Dizziness and giddiness: Secondary | ICD-10-CM | POA: Diagnosis not present

## 2021-03-10 LAB — CBC WITH DIFFERENTIAL/PLATELET
Abs Immature Granulocytes: 0.03 10*3/uL (ref 0.00–0.07)
Basophils Absolute: 0 10*3/uL (ref 0.0–0.1)
Basophils Relative: 1 %
Eosinophils Absolute: 0.2 10*3/uL (ref 0.0–0.5)
Eosinophils Relative: 2 %
HCT: 41.2 % (ref 39.0–52.0)
Hemoglobin: 13.5 g/dL (ref 13.0–17.0)
Immature Granulocytes: 0 %
Lymphocytes Relative: 38 %
Lymphs Abs: 3.1 10*3/uL (ref 0.7–4.0)
MCH: 27.4 pg (ref 26.0–34.0)
MCHC: 32.8 g/dL (ref 30.0–36.0)
MCV: 83.7 fL (ref 80.0–100.0)
Monocytes Absolute: 0.9 10*3/uL (ref 0.1–1.0)
Monocytes Relative: 11 %
Neutro Abs: 3.9 10*3/uL (ref 1.7–7.7)
Neutrophils Relative %: 48 %
Platelets: 290 10*3/uL (ref 150–400)
RBC: 4.92 MIL/uL (ref 4.22–5.81)
RDW: 15 % (ref 11.5–15.5)
WBC: 8.2 10*3/uL (ref 4.0–10.5)
nRBC: 0 % (ref 0.0–0.2)

## 2021-03-10 LAB — BRAIN NATRIURETIC PEPTIDE: B Natriuretic Peptide: 40.2 pg/mL (ref 0.0–100.0)

## 2021-03-10 LAB — BASIC METABOLIC PANEL
Anion gap: 13 (ref 5–15)
BUN: 38 mg/dL — ABNORMAL HIGH (ref 8–23)
CO2: 22 mmol/L (ref 22–32)
Calcium: 10.4 mg/dL — ABNORMAL HIGH (ref 8.9–10.3)
Chloride: 101 mmol/L (ref 98–111)
Creatinine, Ser: 2.39 mg/dL — ABNORMAL HIGH (ref 0.61–1.24)
GFR, Estimated: 26 mL/min — ABNORMAL LOW (ref >60)
Glucose, Bld: 143 mg/dL — ABNORMAL HIGH (ref 70–99)
Potassium: 3.3 mmol/L — ABNORMAL LOW (ref 3.5–5.1)
Sodium: 136 mmol/L (ref 135–145)

## 2021-03-10 LAB — TROPONIN I (HIGH SENSITIVITY)
Troponin I (High Sensitivity): 24 ng/L — ABNORMAL HIGH (ref <18)
Troponin I (High Sensitivity): 21 ng/L — ABNORMAL HIGH (ref <18)

## 2021-03-10 NOTE — ED Triage Notes (Signed)
Weakness/dizziness and near syncopal episodes x 1 month per son and pt.  Seen his PCP 5/13 but has not had any results communicated to him as of this morning.  Pt also c/o SOB/cough and chest tightness that is worsening.

## 2021-03-10 NOTE — ED Provider Notes (Signed)
[facility] EMERGENCY DEPARTMENT Provider Note   CSN: [id number] Arrival date & time: 03/10/21  5427     History Chief Complaint  Patient presents with  . Dizziness    Keith Drake is a 86 y.o. male.  He has a history of bladder cancer and CKD hypertension hyperlipidemia.  Pacemaker.  Complaining of a month of generalized weakness dizziness.  Possibly 2 syncopal events.  Tightness in his chest and shortness of breath.  Currently 0 out of 10 pain.  Symptoms worse with exertion.  Improved with rest.  Denies prior history of same.  The history is provided by the patient.  Dizziness Quality:  Lightheadedness Severity:  Moderate Onset quality:  Gradual Duration:  1 month Timing:  Intermittent Progression:  Unchanged Chronicity:  New Relieved by:  Nothing Worsened by:  Nothing Ineffective treatments:  None tried Associated symptoms: shortness of breath   Associated symptoms: no blood in stool, no chest pain, no headaches, no nausea and no vomiting        Past Medical History:  Diagnosis Date  . Anemia   . Arthritis    OCC LEG PAIN  . Bladder cancer ([place]) TCC   History of bladder carcinoma; S/P BCG TX'S ; "never treated for cancer; it was a spot on my bladder" (07/20/2018)  . CKD (chronic kidney disease), stage III   . History of gout    "on daily RX" (07/20/2018)  . History of kidney stones   . Hyperlipidemia   . Hypertension   . Impaired hearing BILATERAL    OCCASIONAL WEARS HEARING AIDS  . Mobitz I    [name] 07/20/2018  . Pneumonia  ~ 1980s X 1  . Presence of permanent cardiac pacemaker 07/20/2018  . Right bundle branch block   . Spondylolisthesis   . Type II diabetes mellitus [facility])     Patient Active Problem List   Diagnosis Date Noted  . Spondylolisthesis at L4-L5 level 08/15/2018  . Symptomatic bradycardia 07/20/2018  . Second degree AV block, Mobitz type I 07/20/2018  . Essential hypertension 03/23/2013  . Hyperlipidemia 03/23/2013  . Insulin  dependent diabetes mellitus 03/23/2013  . Right bundle branch block     Past Surgical History:  Procedure Laterality Date  . COLONOSCOPY  03/2004  . CYSTO / RESECTIONAL BLADDER BX'S  05-01-2011  . CYSTOSCOPY WITH BIOPSY  10/05/2011   Procedure: CYSTOSCOPY WITH BIOPSY;  Surgeon: Claybon Jabs, MD;  Location: [facility];  Service: Urology;  Laterality: N/A;  BLADDER BIOPSY   . CYSTOSCOPY WITH LITHOLAPAXY    . EXCISIONAL HEMORRHOIDECTOMY  1980s  . INSERT / REPLACE / REMOVE PACEMAKER  07/20/2018   Implantation of new dual chamber permanent pacemaker  . PACEMAKER IMPLANT N/A 07/20/2018   Procedure: PACEMAKER IMPLANT;  Surgeon: Sanda Klein, MD;  Location: [place] CV LAB;  Service: Cardiovascular;  Laterality: N/A;  . REFRACTIVE SURGERY Bilateral   . TRANSURETHRAL RESECTION OF BLADDER TUMOR  03-02-2011  . US ECHOCARDIOGRAPHY  07-11-2009   EF 55-60%       Family History  Problem Relation Age of Onset  . Colon cancer Brother   . Cancer Brother   . Cancer Brother   . Diabetes Sister     Social History   Tobacco Use  . Smoking status: Current Some Day Smoker    Packs/day: 0.10    Years: 30.00    Pack years: 3.00    Types: Cigars  . Smokeless tobacco: Never Used  Vaping Use  .  Vaping Use: Never used  Substance Use Topics  . Alcohol use: Not Currently    Comment: 08/12/18 "1 beer/year"  . Drug use: Never    Home Medications Prior to Admission medications   Medication Sig Start Date End Date Taking? Authorizing Provider  acetaminophen (TYLENOL) 500 MG tablet Take 1,000 mg by mouth every 6 (six) hours as needed for mild pain.    [provider]  allopurinol (ZYLOPRIM) 100 MG tablet Take 100 mg by mouth daily. 07/07/18   [provider]  amLODipine (NORVASC) 10 MG tablet Take 10 mg by mouth daily.  12/09/15   [provider]  aspirin 81 MG tablet Take 81 mg by mouth daily.     [provider]  HUMULIN 70/30 (70-30) 100  UNIT/ML injection Inject 15-40 Units into the skin 2 (two) times daily with a meal. Inject 40 units with breakfast and 15 units with supper. 12/24/15   [provider]  hydrochlorothiazide (HYDRODIURIL) 25 MG tablet Take 25 mg by mouth daily.    [provider]  minocycline (MINOCIN,DYNACIN) 100 MG capsule Take 100 mg by mouth daily.  02/11/13   [provider]  oxyCODONE 10 MG TABS Take 1 tablet (10 mg total) by mouth every 3 (three) hours as needed for severe pain ((score 7 to 10)). 08/16/18   Kary Kos, MD  rosuvastatin (CRESTOR) 40 MG tablet Take 40 mg by mouth daily.     [provider]    Allergies    Patient has no allergy information on record.  Review of Systems   Review of Systems  Constitutional: Negative for fever.  HENT: Negative for sore throat.   Eyes: Negative for visual disturbance.  Respiratory: Positive for shortness of breath.   Cardiovascular: Negative for chest pain.  Gastrointestinal: Negative for abdominal pain, blood in stool, nausea and vomiting.  Genitourinary: Negative for dysuria.  Musculoskeletal: Negative for neck pain.  Skin: Negative for rash.  Neurological: Positive for dizziness. Negative for headaches.    Physical Exam Updated Vital Signs BP 138/79 (BP Location: Right Arm)   Pulse 71   Temp 98.6 F (37 C) (Oral)   Resp (!) 24   Ht 5\' 10"  (1.778 m)   Wt 90.7 kg   SpO2 100%   BMI 28.70 kg/m   Physical Exam Vitals and nursing note reviewed.  Constitutional:      Appearance: Normal appearance. He is well-developed.  HENT:     Head: Normocephalic and atraumatic.  Eyes:     Conjunctiva/sclera: Conjunctivae normal.  Cardiovascular:     Rate and Rhythm: Normal rate and regular rhythm.     Heart sounds: No murmur heard.   Pulmonary:     Effort: Pulmonary effort is normal. No respiratory distress.     Breath sounds: Normal breath sounds.  Abdominal:     Palpations: Abdomen is soft.     Tenderness:  There is no abdominal tenderness.  Musculoskeletal:        General: Normal range of motion.     Cervical back: Neck supple.     Right lower leg: No edema.     Left lower leg: No edema.  Skin:    General: Skin is warm and dry.  Neurological:     General: No focal deficit present.     Mental Status: He is alert.     ED Results / Procedures / Treatments   Labs (all labs ordered are listed, but only abnormal results are displayed) Labs  Reviewed  BASIC METABOLIC PANEL - Abnormal; Notable for the following components:      Result Value   Potassium 3.3 (*)    Glucose, Bld 143 (*)    BUN 38 (*)    Creatinine, Ser 2.39 (*)    Calcium 10.4 (*)    GFR, Estimated 26 (*)    All other components within normal limits  TROPONIN I (HIGH SENSITIVITY) - Abnormal; Notable for the following components:   Troponin I (High Sensitivity) 24 (*)    All other components within normal limits  TROPONIN I (HIGH SENSITIVITY) - Abnormal; Notable for the following components:   Troponin I (High Sensitivity) 21 (*)    All other components within normal limits  BRAIN NATRIURETIC PEPTIDE  CBC WITH DIFFERENTIAL/PLATELET    EKG EKG Interpretation  Date/Time:  Monday Mar 10 2021 08:34:45 EDT Ventricular Rate:  73 PR Interval:  153 QRS Duration: 162 QT Interval:  449 QTC Calculation: 495 R Axis:   -70 Text Interpretation: AV PACED RHYTHM No significant change since prior 9/19 Confirmed by Aletta Edouard [phone]) on 03/10/2021 8:42:54 AM   Radiology CT Head Wo Contrast  Result Date: 03/10/2021 CLINICAL DATA:  Dizziness EXAM: CT HEAD WITHOUT CONTRAST TECHNIQUE: Contiguous axial images were obtained from the base of the skull through the vertex without intravenous contrast. COMPARISON:  None. FINDINGS: Brain: There is atrophy and chronic small vessel disease changes. No acute intracranial abnormality. Specifically, no hemorrhage, hydrocephalus, mass lesion, acute infarction, or significant intracranial  injury. Vascular: No hyperdense vessel or unexpected calcification. Skull: No acute calvarial abnormality. Sinuses/Orbits: Mucosal thickening throughout the paranasal sinuses. No air-fluid levels. Other: None IMPRESSION: Atrophy, chronic microvascular disease. No acute intracranial abnormality. Chronic sinusitis. Electronically Signed   By: Rolm Baptise M.D.   On: 03/10/2021 11:34   DG Chest Port 1 View  Result Date: 03/10/2021 CLINICAL DATA:  Shortness of breath. EXAM: PORTABLE CHEST 1 VIEW COMPARISON:  07/21/2018 FINDINGS: The lungs are clear without focal pneumonia, edema, pneumothorax or pleural effusion. Small nodular density seen at the left base, superimposed on the cardiac apex. Left permanent pacemaker again noted. Telemetry leads overlie the chest. IMPRESSION: Small nodular density over the left lung base. CT chest without contrast recommended to further evaluate. Electronically Signed   By: Misty Stanley M.D.   On: 03/10/2021 09:18    Procedures Procedures   Medications Ordered in ED Medications - No data to display  ED Course  I have reviewed the triage vital signs and the nursing notes.  Pertinent labs & imaging results that were available during my care of the patient were reviewed by me and considered in my medical decision making (see chart for details).  Clinical Course as of 03/10/21 1701  Mon Mar 10, 2021  0922 Chest x-Drake interpreted by me as no acute infiltrates.  Does have a nodular density left base. [MB]  9811 And ambulated in department without any difficulties.  Lab work shows some chronic CKD similar to priors. [MB]    Clinical Course User Index [MB] Hayden Rasmussen, MD   MDM Rules/Calculators/A&P                         This patient complains of generalized weakness dizziness fatigue chest tightness shortness of breath; this involves an extensive number of treatment Options and is a complaint that carries with it a high risk of complications and Morbidity.  The differential includes pneumonia, pneumothorax, COVID, dehydration, ACS, anemia,  vertigo, stroke  I ordered, reviewed and interpreted labs, which included CBC with normal white count normal hemoglobin, chemistries fairly normal other than elevated creatinine similar to priors, troponin mildly elevated but flat, BNP not elevated I ordered imaging studies which included chest x-Drake and head CT and I independently    visualized and interpreted imaging which showed no acute findings Additional history obtained from patient's son Previous records obtained and reviewed in epic, no recent admissions  After the interventions stated above, I reevaluated the patient and found to be hemodynamically stable in no distress.  Reviewed work-up with him.  Recommended close follow-up with PCP.  No current indications for admission.     Final Clinical Impression(s) / ED Diagnoses Final diagnoses:  Dizziness  Nonspecific chest pain    Rx / DC Orders ED Discharge Orders    None       Hayden Rasmussen, MD 03/10/21 1704

## 2021-03-10 NOTE — Discharge Instructions (Signed)
You were seen in the emergency department for evaluation of dizziness and some chest tightness.  You had blood work EKG chest x-ray and a CAT scan of your head that did not show an obvious explanation for your symptoms.  Please continue your regular medications and follow-up with your primary care doctor.  Return to the emergency department for any worsening or concerning symptoms.

## 2021-03-10 NOTE — ED Notes (Signed)
Food given to pt.  

## 2021-03-14 ENCOUNTER — Other Ambulatory Visit: Payer: Self-pay | Admitting: Internal Medicine

## 2021-03-14 ENCOUNTER — Ambulatory Visit
Admission: RE | Admit: 2021-03-14 | Discharge: 2021-03-14 | Disposition: A | Discharge status: Home / Self Care | Payer: Medicare Other | Source: Ambulatory Visit | Attending: Internal Medicine | Admitting: Internal Medicine

## 2021-03-14 ENCOUNTER — Other Ambulatory Visit: Payer: Self-pay

## 2021-03-14 DIAGNOSIS — R0602 Shortness of breath: Secondary | ICD-10-CM

## 2021-03-14 DIAGNOSIS — R911 Solitary pulmonary nodule: Secondary | ICD-10-CM

## 2021-03-18 ENCOUNTER — Encounter: Payer: Self-pay | Admitting: Nurse Practitioner

## 2021-03-18 ENCOUNTER — Ambulatory Visit (INDEPENDENT_AMBULATORY_CARE_PROVIDER_SITE_OTHER): Payer: Medicare Other | Admitting: Nurse Practitioner

## 2021-03-18 ENCOUNTER — Other Ambulatory Visit (HOSPITAL_COMMUNITY): Payer: Self-pay | Admitting: Internal Medicine

## 2021-03-18 ENCOUNTER — Other Ambulatory Visit: Payer: Self-pay

## 2021-03-18 VITALS — BP 128/64 | HR 73 | Ht 70.0 in | Wt 193.8 lb

## 2021-03-18 DIAGNOSIS — R0602 Shortness of breath: Secondary | ICD-10-CM

## 2021-03-18 DIAGNOSIS — I10 Essential (primary) hypertension: Secondary | ICD-10-CM

## 2021-03-18 DIAGNOSIS — I6529 Occlusion and stenosis of unspecified carotid artery: Secondary | ICD-10-CM

## 2021-03-18 DIAGNOSIS — I442 Atrioventricular block, complete: Secondary | ICD-10-CM | POA: Diagnosis not present

## 2021-03-18 LAB — CUP PACEART INCLINIC DEVICE CHECK
Date Time Interrogation Session: 20220524091054
Implantable Lead Implant Date: 20190925
Implantable Lead Implant Date: 20190925
Implantable Lead Location: 753859
Implantable Lead Location: 753860
Implantable Pulse Generator Implant Date: 20190925
Pulse Gen Model: 2272
Pulse Gen Serial Number: 9066127

## 2021-03-18 NOTE — Patient Instructions (Addendum)
Medication Instructions:  Your physician has recommended you make the following change in your medication:   1. Stop your HCTZ   Labwork: None ordered.  Testing/Procedures: Your physician has requested that you have an echocardiogram. Echocardiography is a painless test that uses sound waves to create images of your heart. It provides your doctor with information about the size and shape of your heart and how well your heart's chambers and valves are working. This procedure takes approximately one hour. There are no restrictions for this procedure.   Follow-Up:  Monday August 29 @ 8:40am with Dr. Sallyanne Kuster  Any Other Special Instructions Will Be Listed Below (If Applicable).     If you need a refill on your cardiac medications before your next appointment, please call your pharmacy.

## 2021-03-18 NOTE — Progress Notes (Signed)
Electrophysiology Office Note Date: 03/18/2021  ID:  Knight Oelkers, DOB [birth year], MRN [id number]  PCP: Crist Infante, MD Primary Cardiologist: [place]  CC: Pacemaker follow-up/ pre-syncope  Keith Drake is a 86 y.o. male seen today for Dr C.  He presents today for routine electrophysiology followup.  Since last being seen in our clinic, the patient reports doing reasonably well.  He has had 3 episode of near syncope.  These occur after he has been sitting for a while and stands up.  His symptoms are consistent with orthostasis.  He has not had frank syncope. He has also had increased fatigue.   He denies chest pain, palpitations,  PND, orthopnea, nausea, vomiting, syncope, edema, weight gain, or early satiety.  He is seen with his son today who helps with history.   Device History: STJ dual chamber PPM implanted 2019 for Mobitz II    Past Medical History:  Diagnosis Date  . Anemia   . Arthritis    OCC LEG PAIN  . Bladder cancer ([place]) TCC   History of bladder carcinoma; S/P BCG TX'S ; "never treated for cancer; it was a spot on my bladder" (07/20/2018)  . CKD (chronic kidney disease), stage III ([place])   . History of gout    "on daily RX" (07/20/2018)  . History of kidney stones   . Hyperlipidemia   . Hypertension   . Impaired hearing BILATERAL    OCCASIONAL WEARS HEARING AIDS  . Mobitz I    [name] 07/20/2018  . Pneumonia  ~ 1980s X 1  . Presence of permanent cardiac pacemaker 07/20/2018  . Right bundle branch block   . Spondylolisthesis   . Type II diabetes mellitus ([place])    Past Surgical History:  Procedure Laterality Date  . COLONOSCOPY  03/2004  . CYSTO / RESECTIONAL BLADDER BX'S  05-01-2011  . CYSTOSCOPY WITH BIOPSY  10/05/2011   Procedure: CYSTOSCOPY WITH BIOPSY;  Surgeon: Claybon Jabs, MD;  Location: [facility];  Service: Urology;  Laterality: N/A;  BLADDER BIOPSY   . CYSTOSCOPY WITH LITHOLAPAXY    . EXCISIONAL HEMORRHOIDECTOMY  1980s  . INSERT  / REPLACE / REMOVE PACEMAKER  07/20/2018   Implantation of new dual chamber permanent pacemaker  . PACEMAKER IMPLANT N/A 07/20/2018   Procedure: PACEMAKER IMPLANT;  Surgeon: Sanda Klein, MD;  Location: [place] CV LAB;  Service: Cardiovascular;  Laterality: N/A;  . REFRACTIVE SURGERY Bilateral   . TRANSURETHRAL RESECTION OF BLADDER TUMOR  03-02-2011  . US ECHOCARDIOGRAPHY  07-11-2009   EF 55-60%    Current Outpatient Medications  Medication Sig Dispense Refill  . acetaminophen (TYLENOL) 500 MG tablet Take 1,000 mg by mouth every 6 (six) hours as needed for mild pain.    Marland Kitchen allopurinol (ZYLOPRIM) 100 MG tablet Take 100 mg by mouth daily.  2  . amLODipine (NORVASC) 10 MG tablet Take 10 mg by mouth daily.   0  . aspirin 81 MG tablet Take 81 mg by mouth daily.    Marland Kitchen HUMULIN 70/30 (70-30) 100 UNIT/ML injection Inject 15-40 Units into the skin 2 (two) times daily with a meal. Inject 40 units with breakfast and 15 units with supper.  11  . minocycline (MINOCIN,DYNACIN) 100 MG capsule Take 100 mg by mouth daily.     Marland Kitchen oxyCODONE 10 MG TABS Take 1 tablet (10 mg total) by mouth every 3 (three) hours as needed for severe pain ((score 7 to 10)). 40 tablet 0  . rosuvastatin (CRESTOR)  40 MG tablet Take 40 mg by mouth daily.     No current facility-administered medications for this visit.    Allergies:   Fosinopril and Fenofibrate   Social History: Social History   Socioeconomic History  . Marital status: Divorced    Spouse name: Not on file  . Number of children: Not on file  . Years of education: Not on file  . Highest education level: Not on file  Occupational History  . Not on file  Tobacco Use  . Smoking status: Current Some Day Smoker    Packs/day: 0.10    Years: 30.00    Pack years: 3.00    Types: Cigars  . Smokeless tobacco: Never Used  Vaping Use  . Vaping Use: Never used  Substance and Sexual Activity  . Alcohol use: Not Currently    Comment: 08/12/18 "1 beer/year"  .  Drug use: Never  . Sexual activity: Not Currently  Other Topics Concern  . Not on file  Social History Narrative  . Not on file   Social Determinants of Health   Financial Resource Strain: Not on file  Food Insecurity: Not on file  Transportation Needs: Not on file  Physical Activity: Not on file  Stress: Not on file  Social Connections: Not on file  Intimate Partner Violence: Not on file    Family History: Family History  Problem Relation Age of Onset  . Colon cancer Brother   . Cancer Brother   . Cancer Brother   . Diabetes Sister      Review of Systems: All other systems reviewed and are otherwise negative except as noted above.   Physical Exam: VS:  BP 128/64   Pulse 73   Ht 5\' 10"  (1.778 m)   Wt 193 lb 12.8 oz (87.9 kg)   SpO2 99%   BMI 27.81 kg/m  , BMI Body mass index is 27.81 kg/m.  GEN- The patient is elderly appearing, alert and oriented x 3 today, HOH HEENT: normocephalic, atraumatic; sclera clear, conjunctiva pink; hearing intact; oropharynx clear; neck supple  Lungs- Clear to ausculation bilaterally, normal work of breathing.  No wheezes, rales, rhonchi Heart- Regular rate and rhythm, no murmurs, rubs or gallops  GI- soft, non-tender, non-distended, bowel sounds present  Extremities- no clubbing, cyanosis, or edema  MS- no significant deformity or atrophy Skin- warm and dry, no rash or lesion; PPM pocket well healed Psych- euthymic mood, full affect Neuro- strength and sensation are intact  PPM Interrogation- reviewed in detail today,  See PACEART report  EKG:  EKG is not ordered today.  Recent Labs: 03/10/2021: B Natriuretic Peptide 40.2; BUN 38; Creatinine, Ser 2.39; Hemoglobin 13.5; Platelets 290; Potassium 3.3; Sodium 136   Wt Readings from Last 3 Encounters:  03/18/21 193 lb 12.8 oz (87.9 kg)  03/10/21 200 lb (90.7 kg)  11/20/19 200 lb (90.7 kg)     Other studies Reviewed: Additional studies/ records that were reviewed today include:  Dr Lurline Del office notes, PCP notes   Assessment and Plan:  1.  Complete heart block Normal PPM function Pt is pacemaker dependent today See Pace Art report Rest rate was programmed at 50bpm, increased to 65bpm today   2.  Pre-syncope No significant arrhythmias on device interrogation today Will update echo  Stop HCTZ  3.  HTN Stable Stop HCTZ    Current medicines are reviewed at length with the patient today.   The patient does not have concerns regarding his medicines.  The following  changes were made today:  none  Labs/ tests ordered today include: echo Orders Placed This Encounter  Procedures  . ECHOCARDIOGRAM COMPLETE     Disposition:   Follow up with Merlin, 3 months Dr Annetta Maw, Chanetta Marshall, NP 03/18/2021 9:10 AM  [facility] [address] [place] [address] [phone] (office) [phone] (fax)

## 2021-03-19 ENCOUNTER — Ambulatory Visit (HOSPITAL_COMMUNITY)
Admission: RE | Admit: 2021-03-19 | Discharge: 2021-03-19 | Disposition: A | Discharge status: Home / Self Care | Payer: Medicare Other | Source: Ambulatory Visit | Attending: Internal Medicine | Admitting: Internal Medicine

## 2021-03-19 DIAGNOSIS — I6529 Occlusion and stenosis of unspecified carotid artery: Secondary | ICD-10-CM

## 2021-04-16 ENCOUNTER — Encounter (HOSPITAL_COMMUNITY): Payer: Self-pay | Admitting: Nurse Practitioner

## 2021-04-16 ENCOUNTER — Other Ambulatory Visit (HOSPITAL_COMMUNITY): Payer: Medicare Other

## 2021-04-24 ENCOUNTER — Ambulatory Visit (INDEPENDENT_AMBULATORY_CARE_PROVIDER_SITE_OTHER): Payer: Medicare Other

## 2021-04-24 ENCOUNTER — Telehealth: Payer: Self-pay

## 2021-04-24 DIAGNOSIS — I441 Atrioventricular block, second degree: Secondary | ICD-10-CM | POA: Diagnosis not present

## 2021-04-24 LAB — CUP PACEART REMOTE DEVICE CHECK
Battery Remaining Longevity: 79 mo
Battery Remaining Percentage: 72 %
Battery Voltage: 3.01 V
Brady Statistic AP VP Percent: 93 %
Brady Statistic AP VS Percent: 1 %
Brady Statistic AS VP Percent: 7.3 %
Brady Statistic AS VS Percent: 1 %
Brady Statistic RA Percent Paced: 92 %
Brady Statistic RV Percent Paced: 99 %
Date Time Interrogation Session: 20220630020013
Implantable Lead Implant Date: 20190925
Implantable Lead Implant Date: 20190925
Implantable Lead Location: 753859
Implantable Lead Location: 753860
Implantable Pulse Generator Implant Date: 20190925
Lead Channel Impedance Value: 400 Ohm
Lead Channel Impedance Value: 460 Ohm
Lead Channel Pacing Threshold Amplitude: 0.625 V
Lead Channel Pacing Threshold Amplitude: 0.75 V
Lead Channel Pacing Threshold Pulse Width: 0.5 ms
Lead Channel Pacing Threshold Pulse Width: 0.5 ms
Lead Channel Sensing Intrinsic Amplitude: 3.9 mV
Lead Channel Sensing Intrinsic Amplitude: 4.5 mV
Lead Channel Setting Pacing Amplitude: 1 V
Lead Channel Setting Pacing Amplitude: 1.625
Lead Channel Setting Pacing Pulse Width: 0.5 ms
Lead Channel Setting Sensing Sensitivity: 4 mV
Pulse Gen Model: 2272
Pulse Gen Serial Number: 9066127

## 2021-04-24 NOTE — Telephone Encounter (Signed)
Attempted to contact patient to set up device clinic apt for programming changes. No answer, unable to leave VM d/t memory full.  I am so sorry Dr. Sallyanne Kuster. I hope you stay well and let us know in the device clinic if you need anything!

## 2021-04-24 NOTE — Telephone Encounter (Signed)
Merlin alert received for 662 PMT episodes since last remote 03/18/21. Recheck VA time. [employer] suggest - Turn Auto PVARP off or increase shortest PVARP. Routing to Dr. Sallyanne Kuster for review and recommendations.

## 2021-04-30 ENCOUNTER — Telehealth (HOSPITAL_COMMUNITY): Payer: Self-pay | Admitting: Nurse Practitioner

## 2021-04-30 ENCOUNTER — Encounter: Payer: Medicare Other | Admitting: Cardiovascular Disease

## 2021-04-30 NOTE — Telephone Encounter (Signed)
Just an FYI. We have made several attempts to contact this patient including sending a letter to schedule or reschedule their echocardiogram. We will be removing the patient from the echo WQ.   04/16/21 NO SHOWED-MAILED LETTER [facility]      Thank you  

## 2021-05-01 NOTE — Telephone Encounter (Signed)
Spoke with patient.  Appointment scheduled for 05/08/21 8:40am in Device clinic

## 2021-05-08 ENCOUNTER — Ambulatory Visit (INDEPENDENT_AMBULATORY_CARE_PROVIDER_SITE_OTHER): Payer: Medicare Other

## 2021-05-08 ENCOUNTER — Other Ambulatory Visit: Payer: Self-pay

## 2021-05-08 DIAGNOSIS — I441 Atrioventricular block, second degree: Secondary | ICD-10-CM | POA: Diagnosis not present

## 2021-05-08 DIAGNOSIS — R001 Bradycardia, unspecified: Secondary | ICD-10-CM

## 2021-05-08 LAB — CUP PACEART INCLINIC DEVICE CHECK
Battery Remaining Longevity: 106 mo
Battery Voltage: 3.01 V
Brady Statistic RA Percent Paced: 93 %
Brady Statistic RV Percent Paced: 99.89 %
Date Time Interrogation Session: 20220714091053
Implantable Lead Implant Date: 20190925
Implantable Lead Implant Date: 20190925
Implantable Lead Location: 753859
Implantable Lead Location: 753860
Implantable Pulse Generator Implant Date: 20190925
Lead Channel Impedance Value: 412.5 Ohm
Lead Channel Impedance Value: 475 Ohm
Lead Channel Pacing Threshold Amplitude: 0.625 V
Lead Channel Pacing Threshold Amplitude: 0.625 V
Lead Channel Pacing Threshold Pulse Width: 0.5 ms
Lead Channel Pacing Threshold Pulse Width: 0.5 ms
Lead Channel Sensing Intrinsic Amplitude: 3.2 mV
Lead Channel Sensing Intrinsic Amplitude: 4.2 mV
Lead Channel Setting Pacing Amplitude: 0.875
Lead Channel Setting Pacing Amplitude: 1.625
Lead Channel Setting Pacing Pulse Width: 0.5 ms
Lead Channel Setting Sensing Sensitivity: 4 mV
Pulse Gen Model: 2272
Pulse Gen Serial Number: 9066127

## 2021-05-08 NOTE — Progress Notes (Signed)
Pacemaker check in clinic with industry assistance. Normal device function. Thresholds, P wave sensing, impedances consistent with previous measurements. Device programmed to maximize longevity. No mode switches noted.  26 PMT episodes reviewed, appear true PMT. VA conduction time measured at 266ms, device reprogrammed: Rate responsive PVARP programmed from High sensitivity to Off, PVARP set from 238ms to 322ms.   Device programmed at appropriate safety margins. Histogram distribution appropriate for patient activity level. Estimated longevity 8.9years. Patient enrolled in remote follow-up, next scheduled check 07/24/21.  ROV with Dr. Sallyanne Kuster on 06/23/21.   Patient education completed.

## 2021-05-13 NOTE — Progress Notes (Signed)
Remote pacemaker transmission.   

## 2021-06-23 ENCOUNTER — Encounter: Payer: Medicare Other | Admitting: Cardiovascular Disease

## 2021-07-24 ENCOUNTER — Ambulatory Visit (INDEPENDENT_AMBULATORY_CARE_PROVIDER_SITE_OTHER): Payer: Medicare Other

## 2021-07-24 DIAGNOSIS — I441 Atrioventricular block, second degree: Secondary | ICD-10-CM

## 2021-07-24 LAB — CUP PACEART REMOTE DEVICE CHECK
Battery Remaining Longevity: 77 mo
Battery Remaining Percentage: 69 %
Battery Voltage: 3.01 V
Brady Statistic AP VP Percent: 93 %
Brady Statistic AP VS Percent: 1 %
Brady Statistic AS VP Percent: 6.7 %
Brady Statistic AS VS Percent: 1 %
Brady Statistic RA Percent Paced: 93 %
Brady Statistic RV Percent Paced: 99 %
Date Time Interrogation Session: 20220929020012
Implantable Lead Implant Date: 20190925
Implantable Lead Implant Date: 20190925
Implantable Lead Location: 753859
Implantable Lead Location: 753860
Implantable Pulse Generator Implant Date: 20190925
Lead Channel Impedance Value: 440 Ohm
Lead Channel Impedance Value: 510 Ohm
Lead Channel Pacing Threshold Amplitude: 0.625 V
Lead Channel Pacing Threshold Amplitude: 0.75 V
Lead Channel Pacing Threshold Pulse Width: 0.5 ms
Lead Channel Pacing Threshold Pulse Width: 0.5 ms
Lead Channel Sensing Intrinsic Amplitude: 4.1 mV
Lead Channel Sensing Intrinsic Amplitude: 4.8 mV
Lead Channel Setting Pacing Amplitude: 1 V
Lead Channel Setting Pacing Amplitude: 1.625
Lead Channel Setting Pacing Pulse Width: 0.5 ms
Lead Channel Setting Sensing Sensitivity: 4 mV
Pulse Gen Model: 2272
Pulse Gen Serial Number: 9066127

## 2021-07-31 NOTE — Progress Notes (Signed)
Remote pacemaker transmission.   

## 2021-08-04 IMAGING — CT CT HEAD W/O CM
4 series · 17 of 47 positions shown, 19 images · non-contrast
Comparison: None.

CLINICAL DATA: Dizziness

EXAM:
CT HEAD WITHOUT CONTRAST
TECHNIQUE: Contiguous axial images were obtained from the base of the skull
through the vertex without intravenous contrast.

[Series 2: head wo · axial · 0.43mm/px · z∈[-146,-26]mm · 7 of 32 slices shown, 9 images]
[im 4/32  brain]
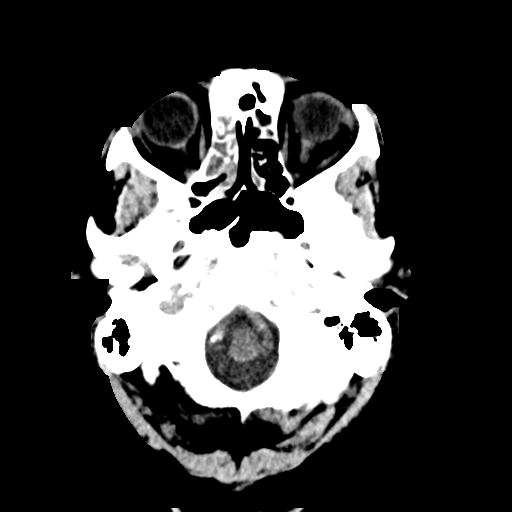
[im 4/32  bone]
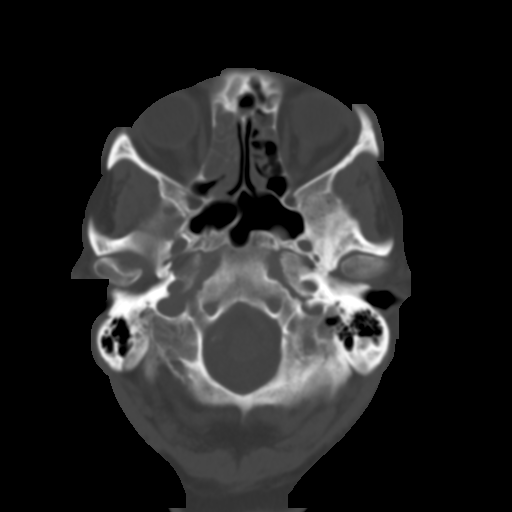
[im 8/32  brain]
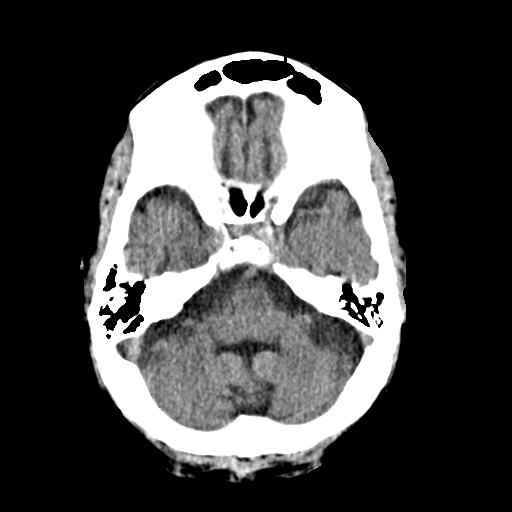
[im 12/32  brain]
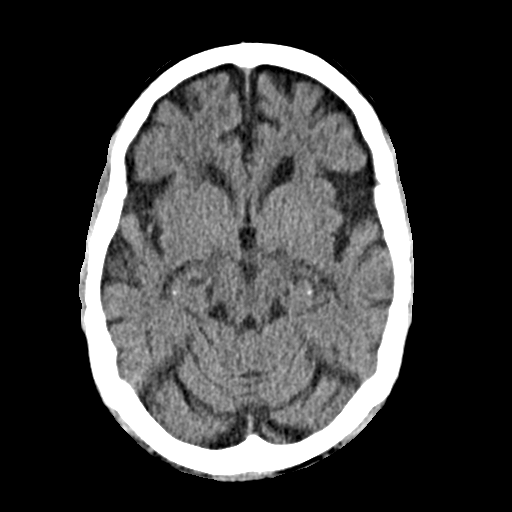
[im 16/32  brain]
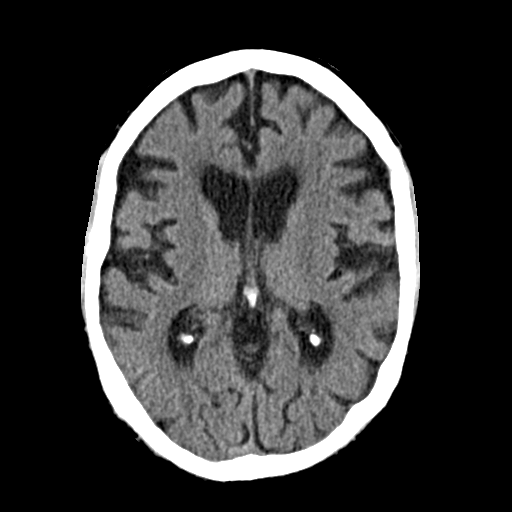
[im 20/32  brain]
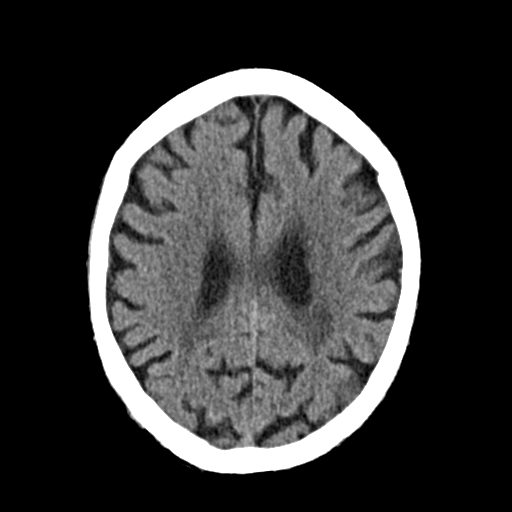
[im 20/32  bone]
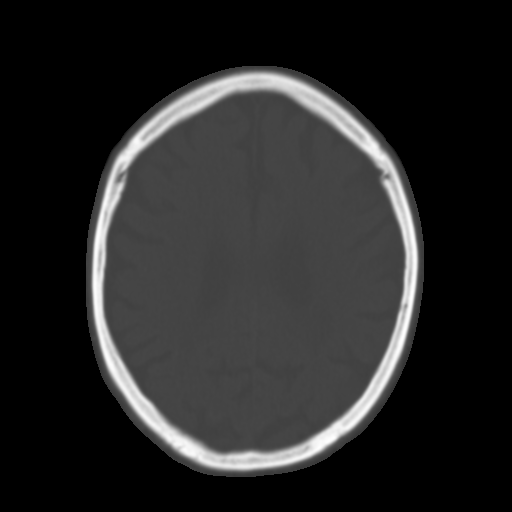
[im 24/32  brain]
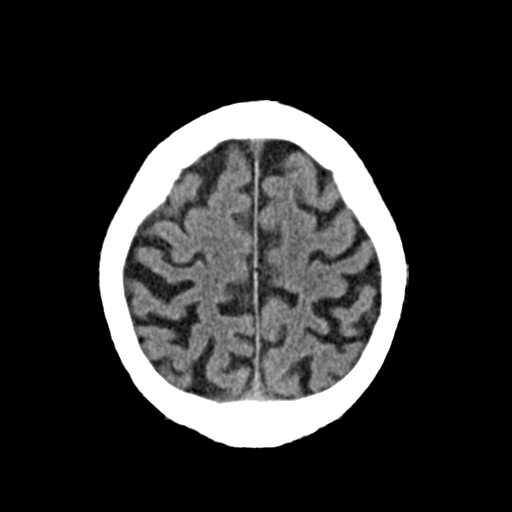
[im 28/32  brain]
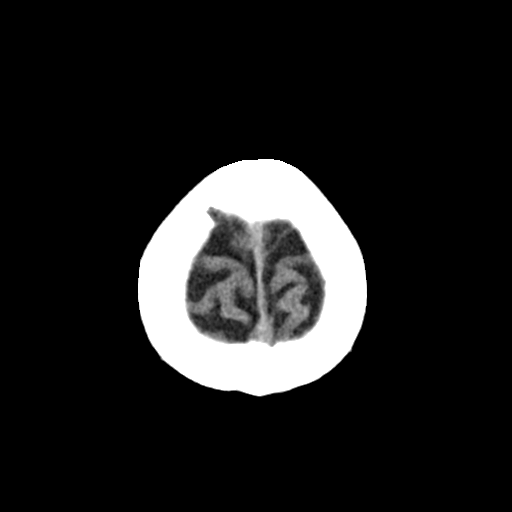

[Series 3: head bone · axial · 0.43mm/px · z∈[-147,-91]mm · 4 of 80 slices shown]
[im 8/80  bone]
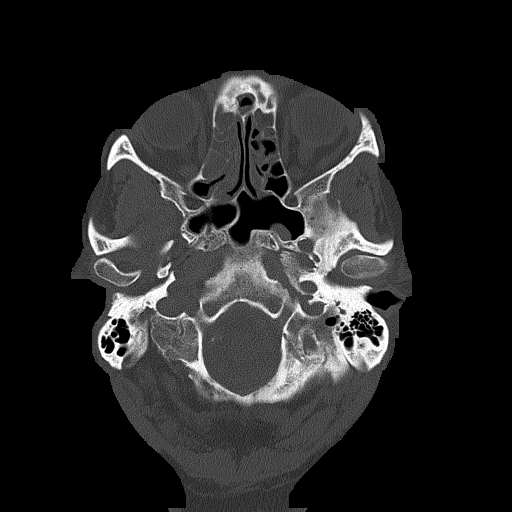
[im 16/80  bone]
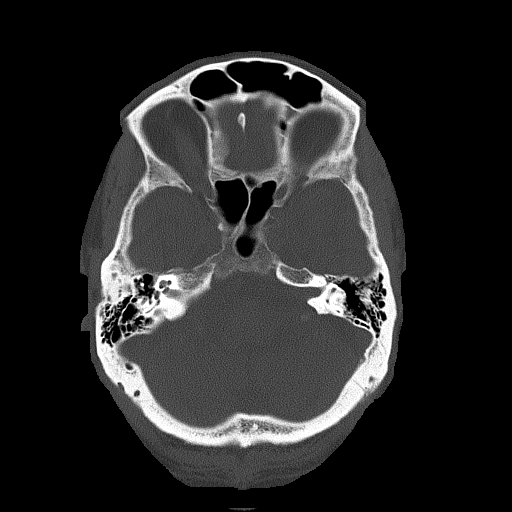
[im 24/80  bone]
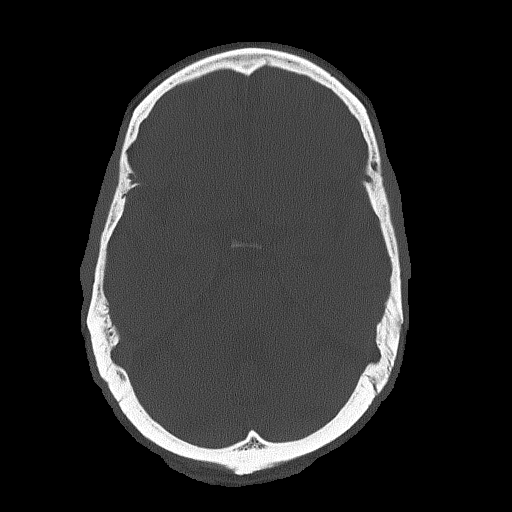
[im 36/80  bone]
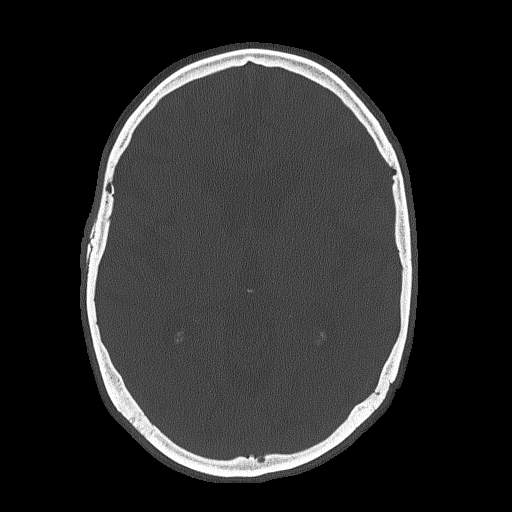

[Series 4: coronal soft · coronal · 0.33mm/px · 3 of 69 slices shown]
[im 23/69  brain]
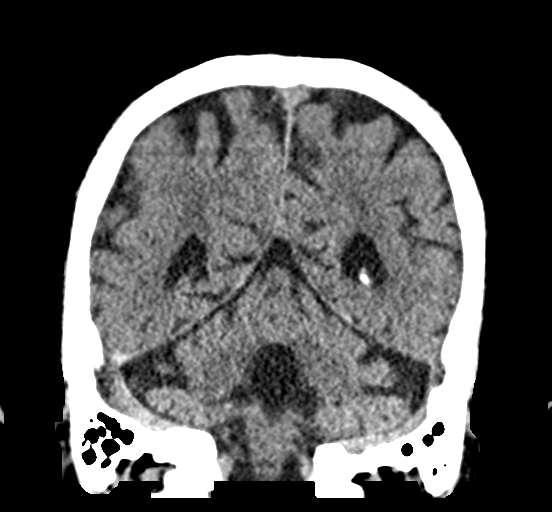
[im 31/69  brain]
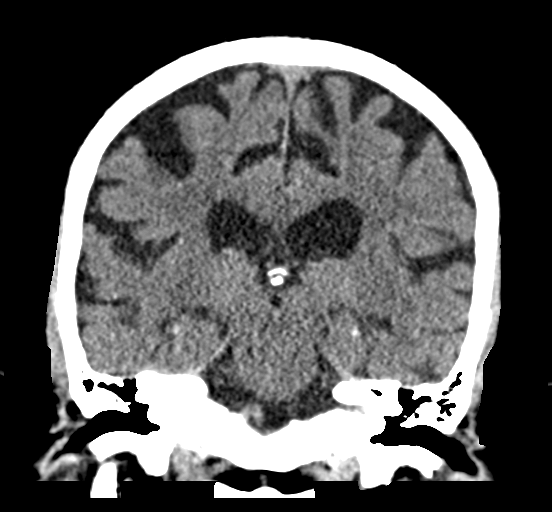
[im 38/69  brain]
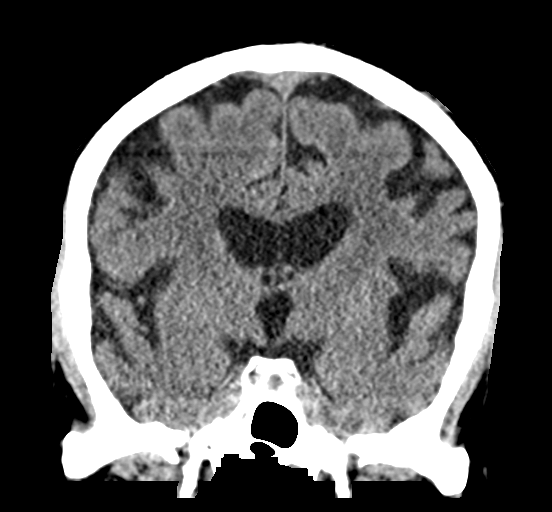

[Series 5: sag soft · sagittal · 0.33mm/px · 3 of 55 slices shown]
[im 19/55  brain]
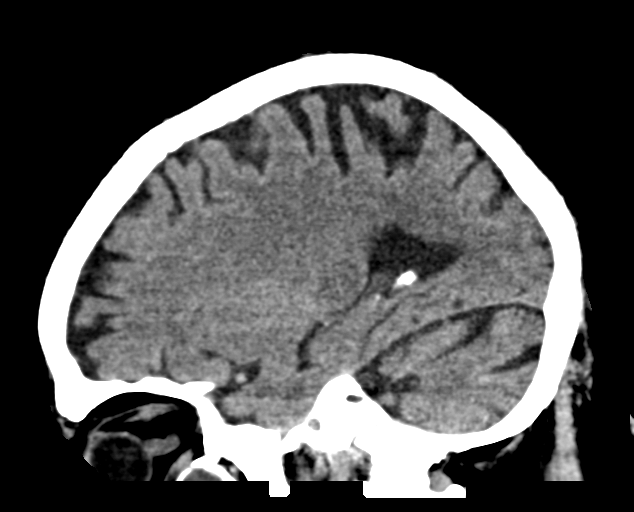
[im 28/55  brain]
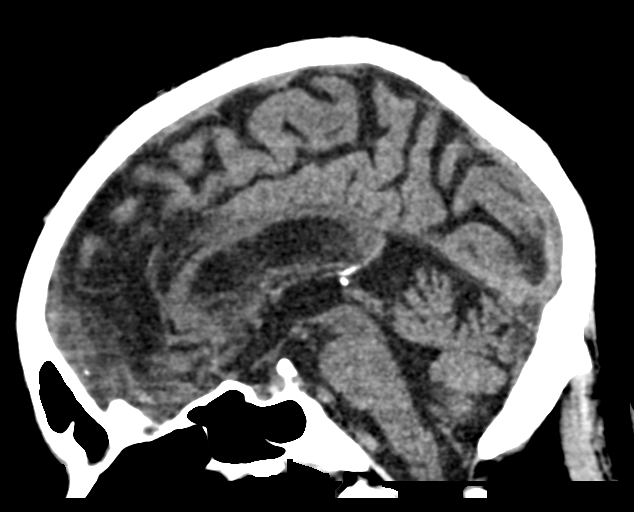
[im 37/55  brain]
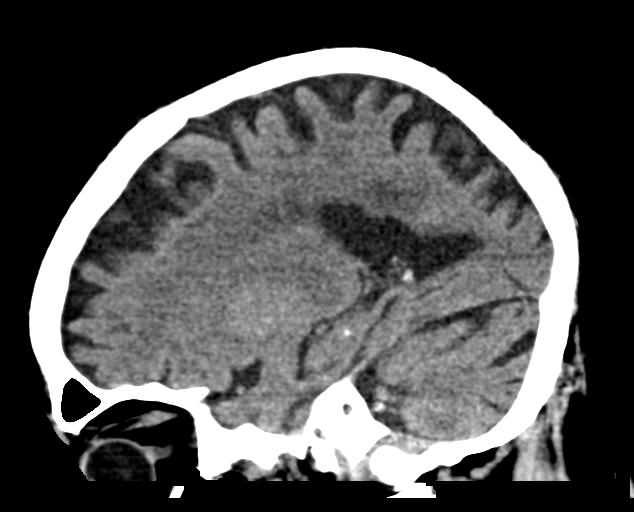

[17 of 47 positions shown; findings below may reference images not displayed]

FINDINGS: Brain: There is atrophy and chronic small vessel disease changes. No
acute intracranial abnormality. Specifically, no hemorrhage,
hydrocephalus, mass lesion, acute infarction, or significant
intracranial injury.

Vascular: No hyperdense vessel or unexpected calcification.

Skull: No acute calvarial abnormality.

Sinuses/Orbits: Mucosal thickening throughout the paranasal sinuses.
No air-fluid levels.

Other: None
IMPRESSION: Atrophy, chronic microvascular disease.

No acute intracranial abnormality.

Chronic sinusitis.

## 2021-08-08 IMAGING — CT CT CHEST W/O CM
1 series · 15 of 34 positions shown, 19 images · non-contrast
Comparison: Portable chest x-ray 03/10/2021 and earlier.

CLINICAL DATA: 86-year-old male with possible left lung base nodule
on recent portable chest x-ray. Remote history of bladder cancer.

EXAM:
CT CHEST WITHOUT CONTRAST
TECHNIQUE: Multidetector CT imaging of the chest was performed following the
standard protocol without IV contrast.

[Series 2: chest w/(date) · axial · 0.82mm/px · z∈[-331,-25]mm · 15 of 181 slices shown, 19 images]
[im 14/181  mediastinal]
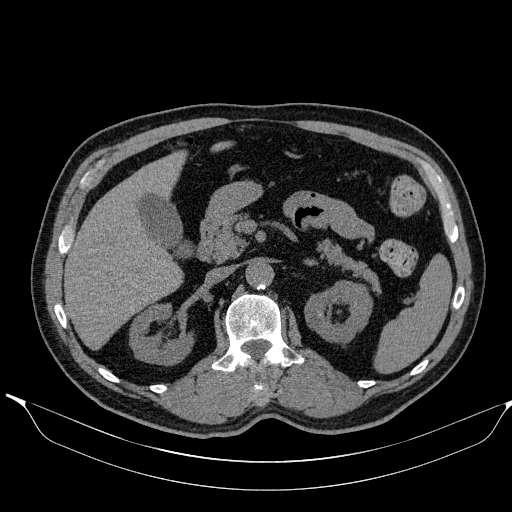
[im 14/181  lung]
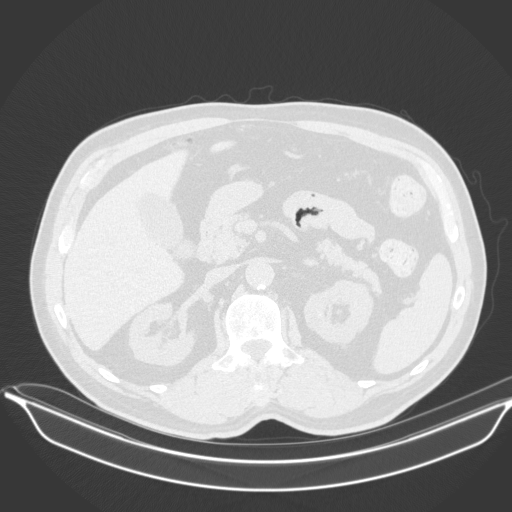
[im 27/181  lung]
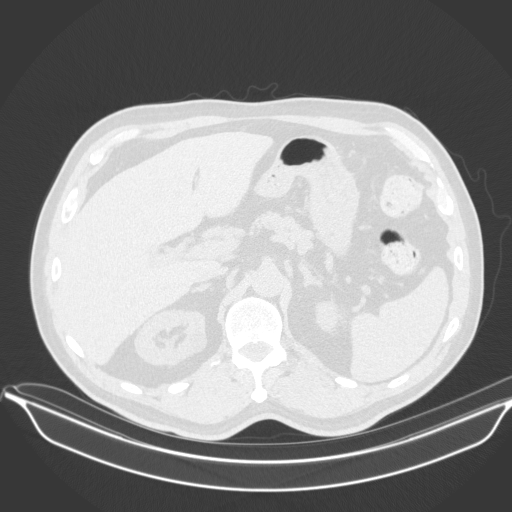
[im 37/181  lung]
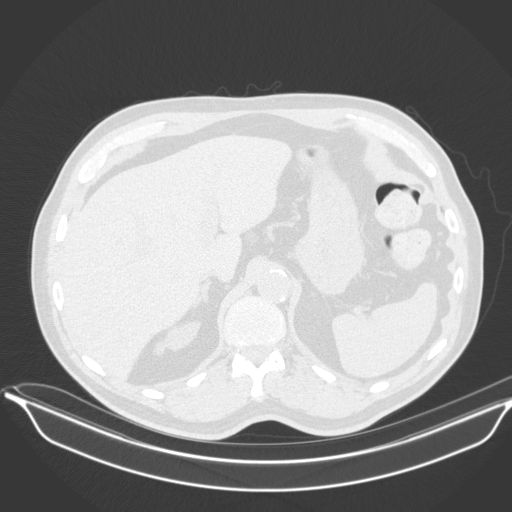
[im 47/181  lung]
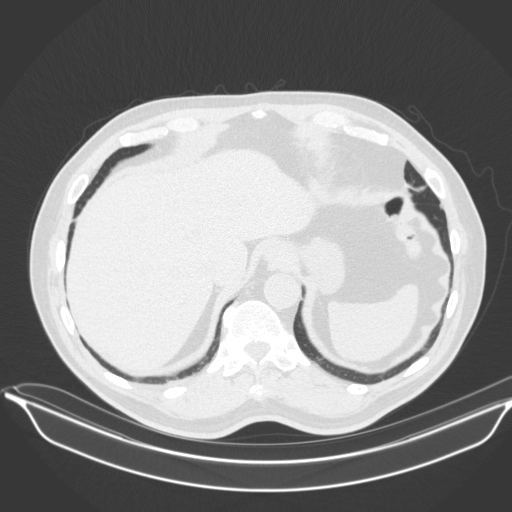
[im 61/181  mediastinal]
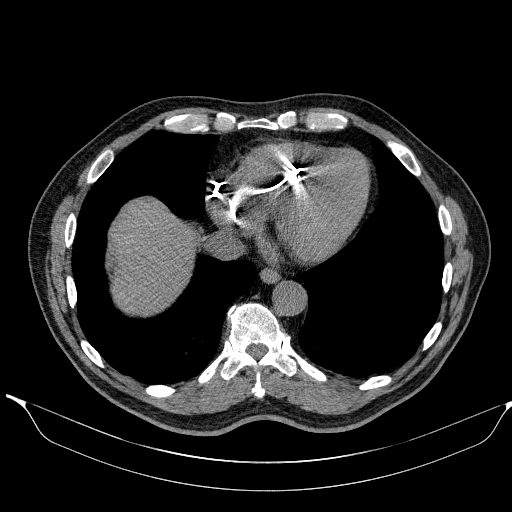
[im 61/181  lung]
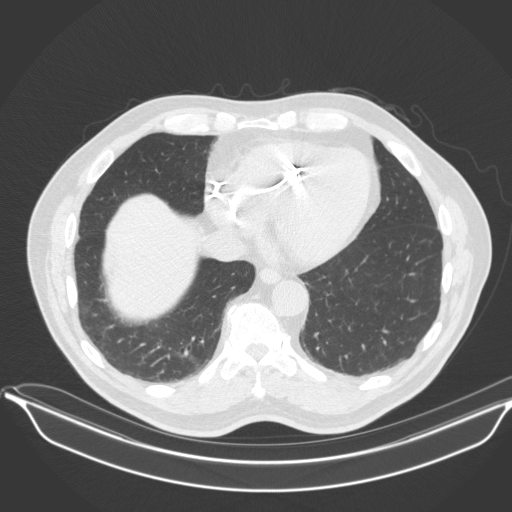
[im 73/181  lung]
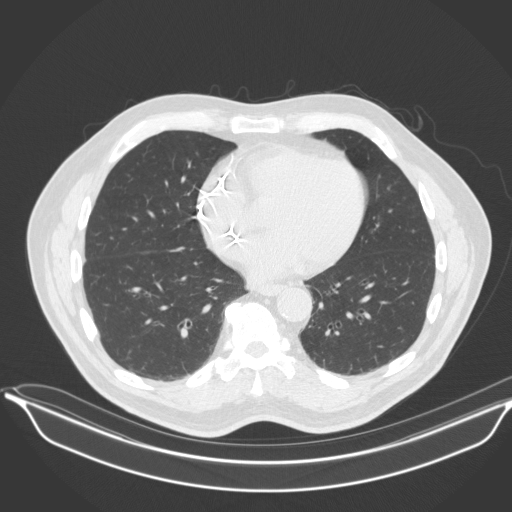
[im 81/181  lung]
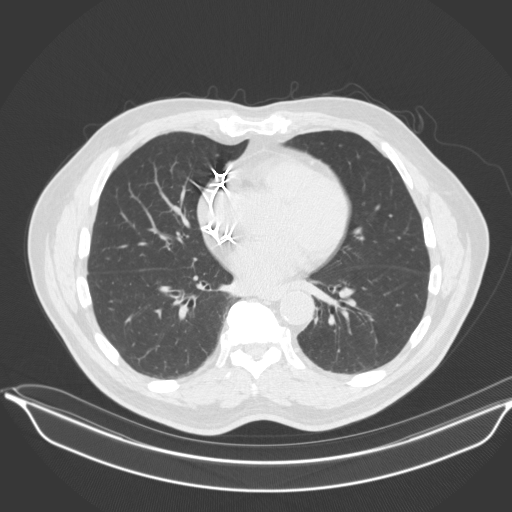
[im 94/181  lung]
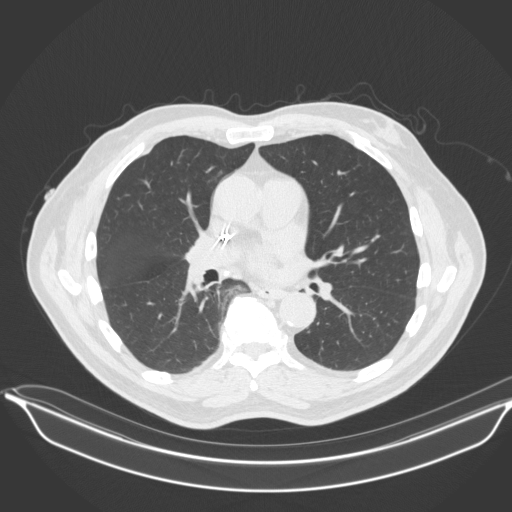
[im 101/181  mediastinal]
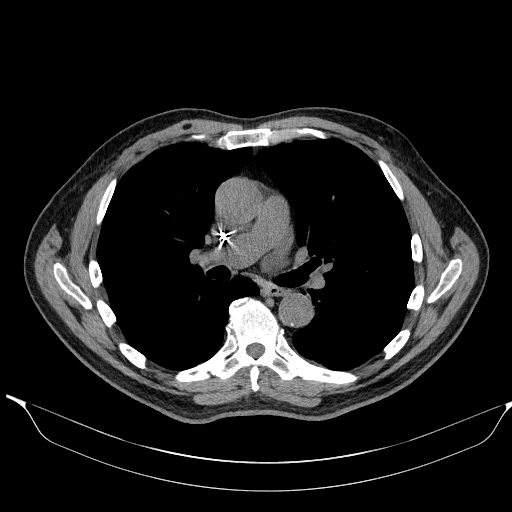
[im 101/181  lung]
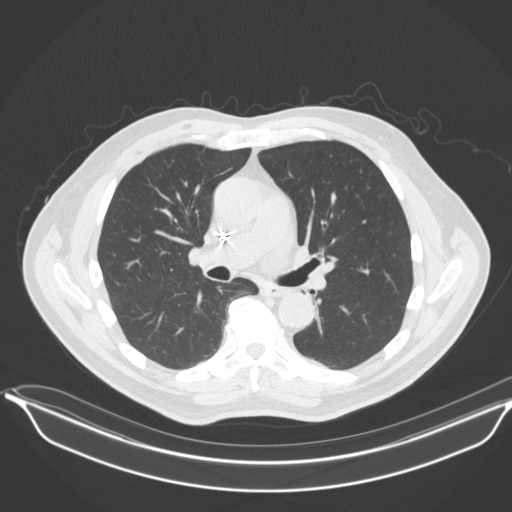
[im 109/181  lung]
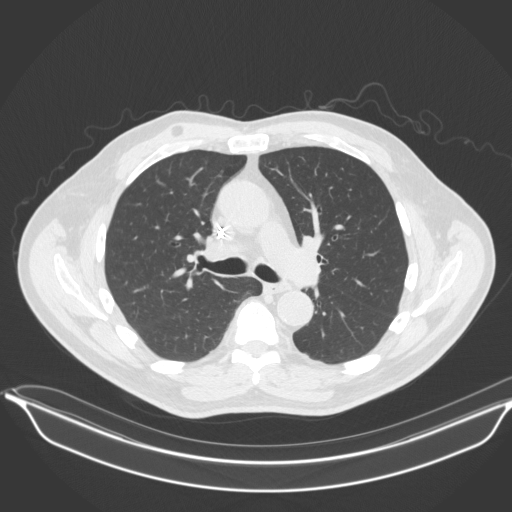
[im 121/181  lung]
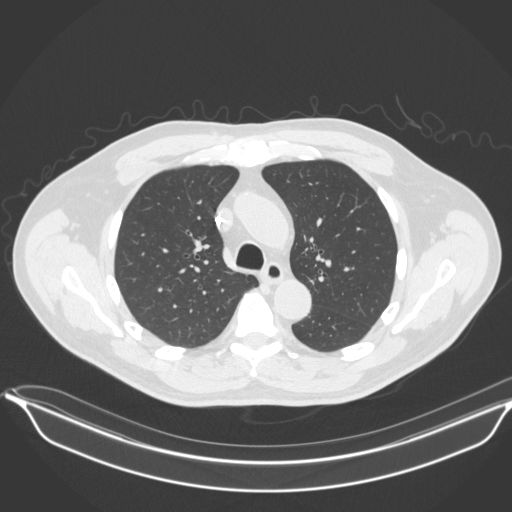
[im 134/181  lung]
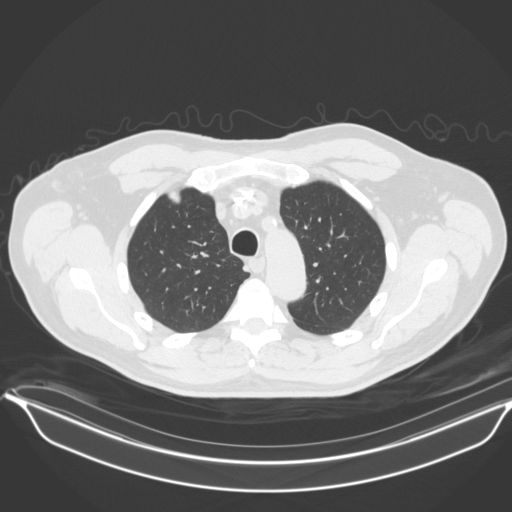
[im 145/181  mediastinal]
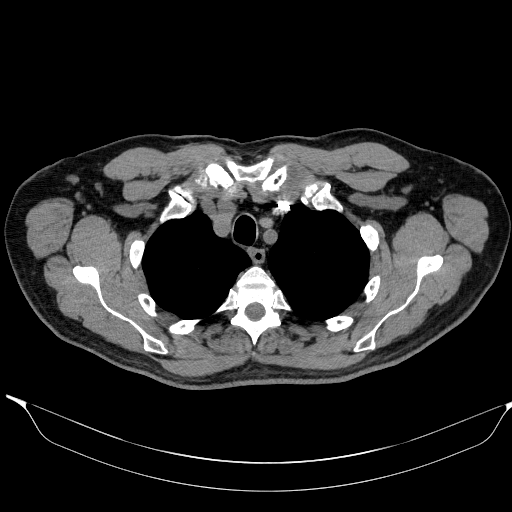
[im 145/181  lung]
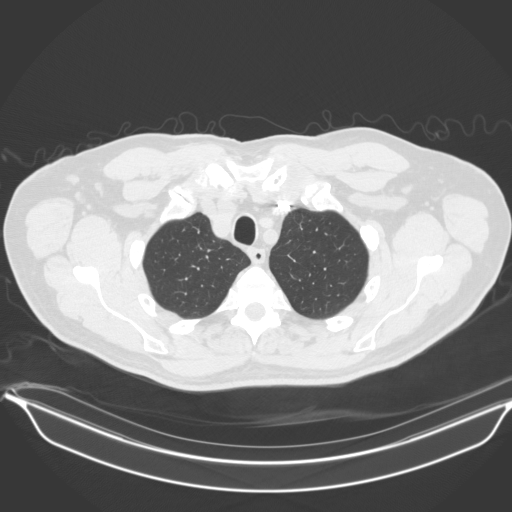
[im 154/181  lung]
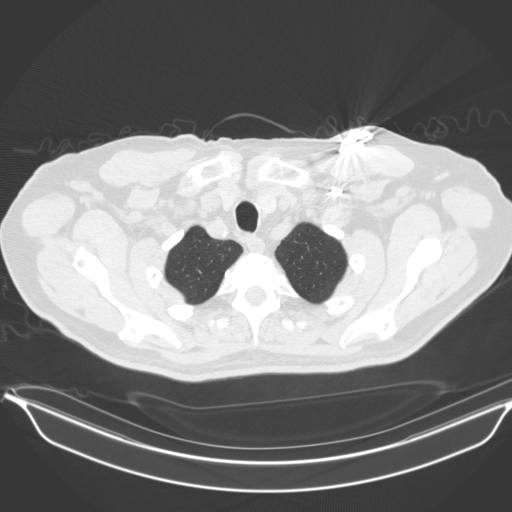
[im 167/181  lung]
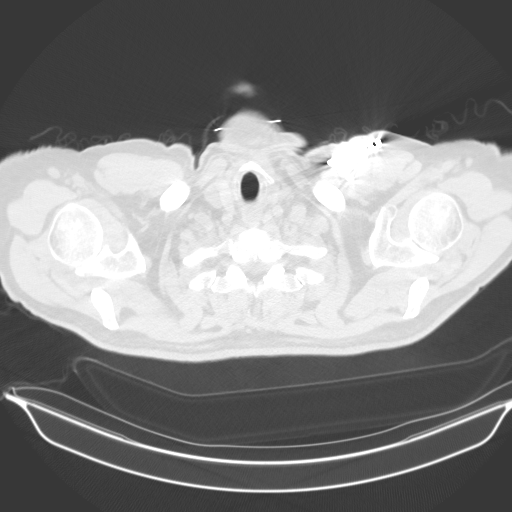

[15 of 34 positions shown; findings below may reference images not displayed]

FINDINGS: Cardiovascular: Left chest trans venous cardiac pacemaker type
device. Cardiac size at the upper limits of normal. No pericardial
effusion. Calcified coronary artery and aortic atherosclerosis.
Vascular patency is not evaluated in the absence of IV contrast.

Mediastinum/Nodes: Small calcified lymph nodes are scattered in the
bilateral mediastinum (precarinal node series 2, image 67) and are
postinflammatory. No suspicious mediastinal or hilar lymph nodes.

Lungs/Pleura: Major airways are patent. There is mild curvilinear
scarring in the costophrenic angles and in the medial basal segment
of the left lower lobe. There is no left lung base lung nodule, and
nipple shadow is suspected on the prior exam (also faintly visible
at the right costophrenic angle at that time).

No pleural effusion or acute pulmonary process.

Upper Abdomen: Small but circumscribed low-density areas in the
liver dome (series 2, image 140) are too small to characterize but
likely benign. A larger 8 mm low-density area in the right hepatic
lobe inferiorly has simple fluid density. There are small benign
renal cysts suspected. And comparison is made to CT Abdomen and
Pelvis 02/17/2011 where similar low-density areas in the liver and
both kidneys are noted. No hydronephrosis. Negative visible
gallbladder, spleen, pancreas, adrenal glands and bowel in the upper
abdomen.

Musculoskeletal: No acute or suspicious osseous lesion.
IMPRESSION: 1. No left lung nodule, with nipple shadow artifact suspected on the
prior exam.
2. No acute findings in the chest. Small calcified postinflammatory
mediastinal lymph nodes.
3. Multiple small benign hepatic and renal cysts.
4. Calcified coronary artery and Aortic Atherosclerosis
([id number].B).

## 2021-10-23 ENCOUNTER — Ambulatory Visit (INDEPENDENT_AMBULATORY_CARE_PROVIDER_SITE_OTHER): Payer: Medicare Other

## 2021-10-23 DIAGNOSIS — I441 Atrioventricular block, second degree: Secondary | ICD-10-CM

## 2021-10-23 LAB — CUP PACEART REMOTE DEVICE CHECK
Battery Remaining Longevity: 71 mo
Battery Remaining Percentage: 66 %
Battery Voltage: 2.99 V
Brady Statistic AP VP Percent: 91 %
Brady Statistic AP VS Percent: 1 %
Brady Statistic AS VP Percent: 8.3 %
Brady Statistic AS VS Percent: 1 %
Brady Statistic RA Percent Paced: 91 %
Brady Statistic RV Percent Paced: 99 %
Date Time Interrogation Session: 20221229020550
Implantable Lead Implant Date: 20190925
Implantable Lead Implant Date: 20190925
Implantable Lead Location: 753859
Implantable Lead Location: 753860
Implantable Pulse Generator Implant Date: 20190925
Lead Channel Impedance Value: 400 Ohm
Lead Channel Impedance Value: 460 Ohm
Lead Channel Pacing Threshold Amplitude: 0.625 V
Lead Channel Pacing Threshold Amplitude: 0.75 V
Lead Channel Pacing Threshold Pulse Width: 0.5 ms
Lead Channel Pacing Threshold Pulse Width: 0.5 ms
Lead Channel Sensing Intrinsic Amplitude: 4.6 mV
Lead Channel Sensing Intrinsic Amplitude: 4.8 mV
Lead Channel Setting Pacing Amplitude: 1 V
Lead Channel Setting Pacing Amplitude: 1.625
Lead Channel Setting Pacing Pulse Width: 0.5 ms
Lead Channel Setting Sensing Sensitivity: 4 mV
Pulse Gen Model: 2272
Pulse Gen Serial Number: 9066127

## 2021-11-04 NOTE — Progress Notes (Signed)
Remote pacemaker transmission.   

## 2022-01-22 ENCOUNTER — Ambulatory Visit (INDEPENDENT_AMBULATORY_CARE_PROVIDER_SITE_OTHER): Payer: Medicare Other

## 2022-01-22 DIAGNOSIS — I441 Atrioventricular block, second degree: Secondary | ICD-10-CM | POA: Diagnosis not present

## 2022-01-22 LAB — CUP PACEART REMOTE DEVICE CHECK
Battery Remaining Longevity: 69 mo
Battery Remaining Percentage: 63 %
Battery Voltage: 2.99 V
Brady Statistic AP VP Percent: 90 %
Brady Statistic AP VS Percent: 1 %
Brady Statistic AS VP Percent: 9.7 %
Brady Statistic AS VS Percent: 1 %
Brady Statistic RA Percent Paced: 90 %
Brady Statistic RV Percent Paced: 99 %
Date Time Interrogation Session: 20230330020013
Implantable Lead Implant Date: 20190925
Implantable Lead Implant Date: 20190925
Implantable Lead Location: 753859
Implantable Lead Location: 753860
Implantable Pulse Generator Implant Date: 20190925
Lead Channel Impedance Value: 410 Ohm
Lead Channel Impedance Value: 480 Ohm
Lead Channel Pacing Threshold Amplitude: 0.625 V
Lead Channel Pacing Threshold Amplitude: 0.75 V
Lead Channel Pacing Threshold Pulse Width: 0.5 ms
Lead Channel Pacing Threshold Pulse Width: 0.5 ms
Lead Channel Sensing Intrinsic Amplitude: 4.3 mV
Lead Channel Sensing Intrinsic Amplitude: 5 mV
Lead Channel Setting Pacing Amplitude: 1 V
Lead Channel Setting Pacing Amplitude: 1.625
Lead Channel Setting Pacing Pulse Width: 0.5 ms
Lead Channel Setting Sensing Sensitivity: 4 mV
Pulse Gen Model: 2272
Pulse Gen Serial Number: 9066127

## 2022-02-03 NOTE — Progress Notes (Signed)
Remote pacemaker transmission.   

## 2022-04-23 ENCOUNTER — Ambulatory Visit (INDEPENDENT_AMBULATORY_CARE_PROVIDER_SITE_OTHER): Payer: Medicare Other

## 2022-04-23 DIAGNOSIS — I441 Atrioventricular block, second degree: Secondary | ICD-10-CM

## 2022-04-23 LAB — CUP PACEART REMOTE DEVICE CHECK
Battery Remaining Longevity: 65 mo
Battery Remaining Percentage: 60 %
Battery Voltage: 2.99 V
Brady Statistic AP VP Percent: 90 %
Brady Statistic AP VS Percent: 1 %
Brady Statistic AS VP Percent: 9.5 %
Brady Statistic AS VS Percent: 1 %
Brady Statistic RA Percent Paced: 90 %
Brady Statistic RV Percent Paced: 99 %
Date Time Interrogation Session: 20230629020014
Implantable Lead Implant Date: 20190925
Implantable Lead Implant Date: 20190925
Implantable Lead Location: 753859
Implantable Lead Location: 753860
Implantable Pulse Generator Implant Date: 20190925
Lead Channel Impedance Value: 410 Ohm
Lead Channel Impedance Value: 460 Ohm
Lead Channel Pacing Threshold Amplitude: 0.625 V
Lead Channel Pacing Threshold Amplitude: 1 V
Lead Channel Pacing Threshold Pulse Width: 0.5 ms
Lead Channel Pacing Threshold Pulse Width: 0.5 ms
Lead Channel Sensing Intrinsic Amplitude: 4 mV
Lead Channel Sensing Intrinsic Amplitude: 5 mV
Lead Channel Setting Pacing Amplitude: 1.25 V
Lead Channel Setting Pacing Amplitude: 1.625
Lead Channel Setting Pacing Pulse Width: 0.5 ms
Lead Channel Setting Sensing Sensitivity: 4 mV
Pulse Gen Model: 2272
Pulse Gen Serial Number: 9066127

## 2022-05-07 NOTE — Progress Notes (Signed)
Remote pacemaker transmission.   

## 2022-07-23 ENCOUNTER — Ambulatory Visit (INDEPENDENT_AMBULATORY_CARE_PROVIDER_SITE_OTHER): Payer: Medicare Other

## 2022-07-23 DIAGNOSIS — I441 Atrioventricular block, second degree: Secondary | ICD-10-CM

## 2022-07-24 LAB — CUP PACEART REMOTE DEVICE CHECK
Battery Remaining Longevity: 62 mo
Battery Remaining Percentage: 57 %
Battery Voltage: 2.99 V
Brady Statistic AP VP Percent: 91 %
Brady Statistic AP VS Percent: 1 %
Brady Statistic AS VP Percent: 9.3 %
Brady Statistic AS VS Percent: 1 %
Brady Statistic RA Percent Paced: 90 %
Brady Statistic RV Percent Paced: 99 %
Date Time Interrogation Session: 20230928020014
Implantable Lead Implant Date: 20190925
Implantable Lead Implant Date: 20190925
Implantable Lead Location: 753859
Implantable Lead Location: 753860
Implantable Pulse Generator Implant Date: 20190925
Lead Channel Impedance Value: 400 Ohm
Lead Channel Impedance Value: 460 Ohm
Lead Channel Pacing Threshold Amplitude: 0.625 V
Lead Channel Pacing Threshold Amplitude: 0.875 V
Lead Channel Pacing Threshold Pulse Width: 0.5 ms
Lead Channel Pacing Threshold Pulse Width: 0.5 ms
Lead Channel Sensing Intrinsic Amplitude: 4.2 mV
Lead Channel Sensing Intrinsic Amplitude: 5 mV
Lead Channel Setting Pacing Amplitude: 1.125
Lead Channel Setting Pacing Amplitude: 1.625
Lead Channel Setting Pacing Pulse Width: 0.5 ms
Lead Channel Setting Sensing Sensitivity: 4 mV
Pulse Gen Model: 2272
Pulse Gen Serial Number: 9066127

## 2022-07-29 NOTE — Progress Notes (Signed)
Remote pacemaker transmission.   

## 2022-10-22 ENCOUNTER — Ambulatory Visit (INDEPENDENT_AMBULATORY_CARE_PROVIDER_SITE_OTHER): Payer: Medicare Other

## 2022-10-22 DIAGNOSIS — Z950 Presence of cardiac pacemaker: Secondary | ICD-10-CM

## 2022-10-22 DIAGNOSIS — I441 Atrioventricular block, second degree: Secondary | ICD-10-CM | POA: Diagnosis not present

## 2022-10-22 LAB — CUP PACEART REMOTE DEVICE CHECK
Battery Remaining Longevity: 59 mo
Battery Remaining Percentage: 54 %
Battery Voltage: 2.99 V
Brady Statistic AP VP Percent: 91 %
Brady Statistic AP VS Percent: 1 %
Brady Statistic AS VP Percent: 9.1 %
Brady Statistic AS VS Percent: 1 %
Brady Statistic RA Percent Paced: 91 %
Brady Statistic RV Percent Paced: 99 %
Date Time Interrogation Session: 20231228020025
Implantable Lead Connection Status: 753985
Implantable Lead Connection Status: 753985
Implantable Lead Implant Date: 20190925
Implantable Lead Implant Date: 20190925
Implantable Lead Location: 753859
Implantable Lead Location: 753860
Implantable Pulse Generator Implant Date: 20190925
Lead Channel Impedance Value: 430 Ohm
Lead Channel Impedance Value: 480 Ohm
Lead Channel Pacing Threshold Amplitude: 0.625 V
Lead Channel Pacing Threshold Amplitude: 0.875 V
Lead Channel Pacing Threshold Pulse Width: 0.5 ms
Lead Channel Pacing Threshold Pulse Width: 0.5 ms
Lead Channel Sensing Intrinsic Amplitude: 12 mV
Lead Channel Sensing Intrinsic Amplitude: 5 mV
Lead Channel Setting Pacing Amplitude: 1.125
Lead Channel Setting Pacing Amplitude: 1.625
Lead Channel Setting Pacing Pulse Width: 0.5 ms
Lead Channel Setting Sensing Sensitivity: 4 mV
Pulse Gen Model: 2272
Pulse Gen Serial Number: 9066127

## 2022-11-09 NOTE — Progress Notes (Signed)
Remote pacemaker transmission.   

## 2022-12-21 ENCOUNTER — Ambulatory Visit: Payer: Medicare Other | Attending: Cardiovascular Disease | Admitting: Cardiovascular Disease

## 2022-12-21 ENCOUNTER — Ambulatory Visit: Payer: Medicare Other | Admitting: Cardiovascular Disease

## 2022-12-21 ENCOUNTER — Encounter: Payer: Self-pay | Admitting: Cardiovascular Disease

## 2022-12-21 VITALS — BP 139/79 | HR 70 | Ht 70.0 in | Wt 196.2 lb

## 2022-12-21 DIAGNOSIS — I442 Atrioventricular block, complete: Secondary | ICD-10-CM | POA: Diagnosis not present

## 2022-12-21 DIAGNOSIS — Z950 Presence of cardiac pacemaker: Secondary | ICD-10-CM

## 2022-12-21 DIAGNOSIS — E782 Mixed hyperlipidemia: Secondary | ICD-10-CM

## 2022-12-21 DIAGNOSIS — E118 Type 2 diabetes mellitus with unspecified complications: Secondary | ICD-10-CM

## 2022-12-21 DIAGNOSIS — N184 Chronic kidney disease, stage 4 (severe): Secondary | ICD-10-CM

## 2022-12-21 DIAGNOSIS — I10 Essential (primary) hypertension: Secondary | ICD-10-CM | POA: Diagnosis not present

## 2022-12-21 DIAGNOSIS — Z794 Long term (current) use of insulin: Secondary | ICD-10-CM

## 2022-12-21 NOTE — Patient Instructions (Signed)
Medication Instructions:  No changes *If you need a refill on your cardiac medications before your next appointment, please call your pharmacy*  Follow-Up: At [facility], you and your health needs are our priority.  As part of our continuing mission to provide you with exceptional heart care, we have created designated Provider Care Teams.  These Care Teams include your primary Cardiologist (physician) and Advanced Practice Providers (APPs -  Physician Assistants and Nurse Practitioners) who all work together to provide you with the care you need, when you need it.  We recommend signing up for the patient portal called "MyChart".  Sign up information is provided on this After Visit Summary.  MyChart is used to connect with patients for Virtual Visits (Telemedicine).  Patients are able to view lab/test results, encounter notes, upcoming appointments, etc.  Non-urgent messages can be sent to your provider as well.   To learn more about what you can do with MyChart, go to [email or web address].    Your next appointment:   1 year(s)  Provider:   Sanda Klein, MD

## 2022-12-21 NOTE — Progress Notes (Signed)
Cardiology Office Note:    Date:  12/21/2022   ID:  Keith Drake, DOB 07/13/1935, MRN [id number]  PCP:  Crist Infante, MD  Cardiologist:  Sanda Klein, MD  Electrophysiologist:  None   Referring MD: Crist Infante, MD   Chief Complaint  Patient presents with   Pacemaker Check    History of Present Illness:    Keith Drake is a 88 y.o. male with a hx of recurrent episodes of near syncope related to second-degree AV block Mobitz type I, who underwent implantation of a dual-chamber pacemaker in September 2019 ([place]).  Arrhythmia was detected when he was preparing to undergo lumbar spine surgery (spondylolisthesis L4-L5 with lumbar stenosis and neurogenic claudication).  He has not had any episodes of "near syncope due to low blood sugar" since his pacemaker was implanted.  Generally feels very well and is remarkably strong for his age.  Lives independently.  He has mild ongoing pedal edema at the end of the day that resolves after overnight rest in bed.  He takes amlodipine.  The patient specifically denies any chest pain at rest exertion, dyspnea at rest or with exertion, orthopnea, paroxysmal nocturnal dyspnea, syncope, palpitations, focal neurological deficits, intermittent claudication, unexplained weight gain, cough, hemoptysis or wheezing.  Glycemic control has been acceptable with a recent hemoglobin A1c of 7.9%, but the dose of insulin required has steadily been decreasing, probably due to worsening kidney function.  His most recent creatinine was 2.79.  He has a chronically low HDL at 27, but LDL cholesterol on rosuvastatin is acceptable at 84.  He has markedly elevated triglycerides at 510.  He is seeing a new specialist at [facility], and sounds like it might be Dr. Marval Regal.   Pacemaker function is normal.  Estimated generator longevity is 4.4-4.8 years.  All lead parameters are excellent.  He has 90% atrial pacing and virtually 100% ventricular pacing.   Today he does have intermittent AV conduction consistent with high-grade second-degree AV block, but at previous appointments he has presented with complete heart block.  The underlying sinus rate is in the low-mid 50s.  The heart rate histogram distribution is acceptable.  As before he has occasional episodes of mode switch, many which are due to competitive pacing, but some are very brief nonsustained ectopic atrial tachycardia.  No changes were made to device settings today.    Past Medical History:  Diagnosis Date   Anemia    Arthritis    OCC LEG PAIN   Bladder cancer ([place]) TCC   History of bladder carcinoma; S/P BCG TX'S ; "never treated for cancer; it was a spot on my bladder" (07/20/2018)   CKD (chronic kidney disease), stage III (HCC)    History of gout    "on daily RX" (07/20/2018)   History of kidney stones    Hyperlipidemia    Hypertension    Impaired hearing BILATERAL    OCCASIONAL WEARS HEARING AIDS   Mobitz I    [name] 07/20/2018   Pneumonia  ~ 1980s X 1   Presence of permanent cardiac pacemaker 07/20/2018   Right bundle branch block    Spondylolisthesis    Type II diabetes mellitus ([place])     Past Surgical History:  Procedure Laterality Date   COLONOSCOPY  03/2004   CYSTO / RESECTIONAL BLADDER BX'S  05-01-2011   CYSTOSCOPY WITH BIOPSY  10/05/2011   Procedure: CYSTOSCOPY WITH BIOPSY;  Surgeon: Claybon Jabs, MD;  Location: [facility];  Service: Urology;  Laterality: N/A;  BLADDER BIOPSY    CYSTOSCOPY WITH LITHOLAPAXY     EXCISIONAL HEMORRHOIDECTOMY  1980s   INSERT / REPLACE / REMOVE PACEMAKER  07/20/2018   Implantation of new dual chamber permanent pacemaker   PACEMAKER IMPLANT N/A 07/20/2018   Procedure: PACEMAKER IMPLANT;  Surgeon: Sanda Klein, MD;  Location: [facility];  Service: Cardiovascular;  Laterality: N/A;   REFRACTIVE SURGERY Bilateral    TRANSURETHRAL RESECTION OF BLADDER TUMOR  03-02-2011   US ECHOCARDIOGRAPHY  07-11-2009    EF 55-60%    Current Medications: Current Meds  Medication Sig   acetaminophen (TYLENOL) 500 MG tablet Take 1,000 mg by mouth every 6 (six) hours as needed for mild pain.   allopurinol (ZYLOPRIM) 100 MG tablet Take 100 mg by mouth daily.   amLODipine (NORVASC) 10 MG tablet Take 10 mg by mouth daily.    aspirin 81 MG tablet Take 81 mg by mouth daily.   HUMULIN 70/30 (70-30) 100 UNIT/ML injection Inject 15-40 Units into the skin 2 (two) times daily with a meal. Inject 40 units with breakfast and 15 units with supper.   minocycline (MINOCIN,DYNACIN) 100 MG capsule Take 100 mg by mouth daily.    oxyCODONE 10 MG TABS Take 1 tablet (10 mg total) by mouth every 3 (three) hours as needed for severe pain ((score 7 to 10)).   rosuvastatin (CRESTOR) 40 MG tablet Take 40 mg by mouth daily.     Allergies:   Fosinopril and Fenofibrate   Social History   Socioeconomic History   Marital status: Divorced    Spouse name: Not on file   Number of children: Not on file   Years of education: Not on file   Highest education level: Not on file  Occupational History   Not on file  Tobacco Use   Smoking status: Former    Types: Cigars   Smokeless tobacco: Never  Vaping Use   Vaping Use: Never used  Substance and Sexual Activity   Alcohol use: Not Currently    Comment: 08/12/18 "1 beer/year"   Drug use: Never   Sexual activity: Not Currently  Other Topics Concern   Not on file  Social History Narrative   Not on file   Social Determinants of Health   Financial Resource Strain: Not on file  Food Insecurity: Not on file  Transportation Needs: Not on file  Physical Activity: Not on file  Stress: Not on file  Social Connections: Not on file     Family History: The patient's family history includes Cancer in his brother and brother; Colon cancer in his brother; Diabetes in his sister.  ROS:   Please see the history of present illness.    All other systems are reviewed and are  negative.   EKGs/Labs/Other Studies Reviewed:    The following studies were reviewed today: Comprehensive pacemaker check performed by me today  EKG:  EKG is  ordered today.  It shows atrial ventricular sequential pacing at 70 bpm.  QTc 481 ms.  Recent Labs: No results found for requested labs within last 365 days.   10/24/2019 hemoglobin 14.1, creatinine 2.1, hemoglobin A1c 8.3%, AST 52, TSH 2.41 06/10/2022 creatinine 2.79, hemoglobin 12.6, ALT 14  Recent Lipid Panel    Component Value Date/Time   CHOL 115 07/20/2018 1012   TRIG 133 07/20/2018 1012   HDL 27 (L) 07/20/2018 1012   CHOLHDL 4.3 07/20/2018 1012   VLDL 27 07/20/2018 1012   LDLCALC 61 07/20/2018 1012  10/24/2019 total cholesterol 125, HDL 27, LDL 60, triglycerides 190 04/20/2022 total cholesterol 213, HDL 27, LDL 84, triglycerides 500 and Physical Exam:    VS:  BP 139/79   Pulse 70   Ht '5\' 10"'$  (1.778 m)   Wt 196 lb 3.2 oz (89 kg)   SpO2 99%   BMI 28.15 kg/m     Wt Readings from Last 3 Encounters:  12/21/22 196 lb 3.2 oz (89 kg)  03/18/21 193 lb 12.8 oz (87.9 kg)  03/10/21 200 lb (90.7 kg)      General: Alert, oriented x3, no distress, overweight.  Appears well.  Healthy left subclavian pacemaker site. Head: no evidence of trauma, PERRL, EOMI, no exophtalmos or lid lag, no myxedema, no xanthelasma; normal ears, nose and oropharynx Neck: normal jugular venous pulsations and no hepatojugular reflux; brisk carotid pulses without delay and no carotid bruits Chest: clear to auscultation, no signs of consolidation by percussion or palpation, normal fremitus, symmetrical and full respiratory excursions Cardiovascular: normal position and quality of the apical impulse, regular rhythm, normal first and paradoxically split second heart sounds, no murmurs, rubs or gallops Abdomen: no tenderness or distention, no masses by palpation, no abnormal pulsatility or arterial bruits, normal bowel sounds, no  hepatosplenomegaly Extremities: no clubbing, cyanosis or edema; 2+ radial, ulnar and brachial pulses bilaterally; 2+ right femoral, posterior tibial and dorsalis pedis pulses; 2+ left femoral, posterior tibial and dorsalis pedis pulses; no subclavian or femoral bruits Neurological: grossly nonfocal.  Hard of hearing Psych: Normal mood and affect    ASSESSMENT:    1. CHB (complete heart block) (HCC)   2. Pacemaker   3. Essential hypertension   4. Mixed hyperlipidemia   5. Type 2 diabetes mellitus with complication, with long-term current use of insulin ([place])   6. CKD (chronic kidney disease) stage 4, GFR 15-29 ml/min (HCC)     PLAN:    In order of problems listed above:  2nd/3rd deg AVB: At times there is no AV conduction, but today he did have intermittent conducted beats consistent with high-grade second-degree AV block.  He has virtually 100% ventricular pacing.  He should be considered pacemaker dependent. PPM: Normal device function.  Continue with remote downloads every 3 months and yearly office visits.  HTN: fair control HLP: LDL acceptable, chronically low LDL. TG are high, but adding more meds not justified or safe in this 88 yo w CKD.Marland Kitchen  Continue statin. DM: Hemoglobin A1c is acceptable at <8%.  On insulin.  Followed by Dr. Joylene Draft. CKD 4: Avoid nephrotoxic agents.  Creatinine clearance steadily worse which explains the decreased insulin needs. Followed at [facility].   Medication Adjustments/Labs and Tests Ordered: Current medicines are reviewed at length with the patient today.  Concerns regarding medicines are outlined above.  Orders Placed This Encounter  Procedures   EKG 12-Lead   No orders of the defined types were placed in this encounter.   Patient Instructions  Medication Instructions:  No changes *If you need a refill on your cardiac medications before your next appointment, please call your pharmacy*  Follow-Up: At [facility], you and  your health needs are our priority.  As part of our continuing mission to provide you with exceptional heart care, we have created designated Provider Care Teams.  These Care Teams include your primary Cardiologist (physician) and Advanced Practice Providers (APPs -  Physician Assistants and Nurse Practitioners) who all work together to provide you with the care you need, when you need  it.  We recommend signing up for the patient portal called "MyChart".  Sign up information is provided on this After Visit Summary.  MyChart is used to connect with patients for Virtual Visits (Telemedicine).  Patients are able to view lab/test results, encounter notes, upcoming appointments, etc.  Non-urgent messages can be sent to your provider as well.   To learn more about what you can do with MyChart, go to [email or web address].    Your next appointment:   1 year(s)  Provider:   Sanda Klein, MD        Signed, Sanda Klein, MD  12/21/2022 8:49 AM    [place]

## 2023-01-21 ENCOUNTER — Ambulatory Visit (INDEPENDENT_AMBULATORY_CARE_PROVIDER_SITE_OTHER): Payer: Medicare Other

## 2023-01-21 DIAGNOSIS — I442 Atrioventricular block, complete: Secondary | ICD-10-CM

## 2023-01-21 LAB — CUP PACEART REMOTE DEVICE CHECK
Battery Remaining Longevity: 55 mo
Battery Remaining Percentage: 51 %
Battery Voltage: 2.99 V
Brady Statistic AP VP Percent: 90 %
Brady Statistic AP VS Percent: 1 %
Brady Statistic AS VP Percent: 9.6 %
Brady Statistic AS VS Percent: 1 %
Brady Statistic RA Percent Paced: 90 %
Brady Statistic RV Percent Paced: 99 %
Date Time Interrogation Session: 20240328020014
Implantable Lead Connection Status: 753985
Implantable Lead Connection Status: 753985
Implantable Lead Implant Date: 20190925
Implantable Lead Implant Date: 20190925
Implantable Lead Location: 753859
Implantable Lead Location: 753860
Implantable Pulse Generator Implant Date: 20190925
Lead Channel Impedance Value: 400 Ohm
Lead Channel Impedance Value: 460 Ohm
Lead Channel Pacing Threshold Amplitude: 0.5 V
Lead Channel Pacing Threshold Amplitude: 1 V
Lead Channel Pacing Threshold Pulse Width: 0.5 ms
Lead Channel Pacing Threshold Pulse Width: 0.5 ms
Lead Channel Sensing Intrinsic Amplitude: 12 mV
Lead Channel Sensing Intrinsic Amplitude: 4.8 mV
Lead Channel Setting Pacing Amplitude: 1.25 V
Lead Channel Setting Pacing Amplitude: 1.5 V
Lead Channel Setting Pacing Pulse Width: 0.5 ms
Lead Channel Setting Sensing Sensitivity: 4 mV
Pulse Gen Model: 2272
Pulse Gen Serial Number: 9066127

## 2023-02-11 ENCOUNTER — Other Ambulatory Visit (HOSPITAL_COMMUNITY): Payer: Self-pay | Admitting: Neurosurgery

## 2023-02-11 DIAGNOSIS — M5440 Lumbago with sciatica, unspecified side: Secondary | ICD-10-CM

## 2023-02-24 NOTE — Progress Notes (Signed)
Remote pacemaker transmission.   

## 2023-03-08 ENCOUNTER — Other Ambulatory Visit: Payer: Medicare Other

## 2023-04-12 ENCOUNTER — Ambulatory Visit (HOSPITAL_COMMUNITY)
Admission: RE | Admit: 2023-04-12 | Discharge: 2023-04-12 | Disposition: A | Discharge status: Home / Self Care | Payer: Medicare Other | Source: Ambulatory Visit | Attending: Neurosurgery | Admitting: Neurosurgery

## 2023-04-12 ENCOUNTER — Ambulatory Visit (HOSPITAL_COMMUNITY): Payer: Medicare Other

## 2023-04-12 DIAGNOSIS — M5440 Lumbago with sciatica, unspecified side: Secondary | ICD-10-CM | POA: Insufficient documentation

## 2023-04-12 MED ORDER — GADOBUTROL 1 MMOL/ML IV SOLN
8.0000 mL | Freq: Once | INTRAVENOUS | Status: AC | PRN
  Administered 2023-04-12: 8 mL via INTRAVENOUS

## 2023-04-22 ENCOUNTER — Ambulatory Visit (INDEPENDENT_AMBULATORY_CARE_PROVIDER_SITE_OTHER): Payer: Medicare Other

## 2023-04-22 DIAGNOSIS — I442 Atrioventricular block, complete: Secondary | ICD-10-CM

## 2023-04-22 LAB — CUP PACEART REMOTE DEVICE CHECK
Battery Remaining Longevity: 51 mo
Battery Remaining Percentage: 48 %
Battery Voltage: 2.98 V
Brady Statistic AP VP Percent: 90 %
Brady Statistic AP VS Percent: 1 %
Brady Statistic AS VP Percent: 9.8 %
Brady Statistic AS VS Percent: 1 %
Brady Statistic RA Percent Paced: 90 %
Brady Statistic RV Percent Paced: 99 %
Date Time Interrogation Session: 20240627020017
Implantable Lead Connection Status: 753985
Implantable Lead Connection Status: 753985
Implantable Lead Implant Date: 20190925
Implantable Lead Implant Date: 20190925
Implantable Lead Location: 753859
Implantable Lead Location: 753860
Implantable Pulse Generator Implant Date: 20190925
Lead Channel Impedance Value: 400 Ohm
Lead Channel Impedance Value: 450 Ohm
Lead Channel Pacing Threshold Amplitude: 0.5 V
Lead Channel Pacing Threshold Amplitude: 0.875 V
Lead Channel Pacing Threshold Pulse Width: 0.5 ms
Lead Channel Pacing Threshold Pulse Width: 0.5 ms
Lead Channel Sensing Intrinsic Amplitude: 12 mV
Lead Channel Sensing Intrinsic Amplitude: 5 mV
Lead Channel Setting Pacing Amplitude: 1.125
Lead Channel Setting Pacing Amplitude: 1.5 V
Lead Channel Setting Pacing Pulse Width: 0.5 ms
Lead Channel Setting Sensing Sensitivity: 4 mV
Pulse Gen Model: 2272
Pulse Gen Serial Number: 9066127

## 2023-05-11 NOTE — Progress Notes (Signed)
 Remote pacemaker transmission.   

## 2023-06-29 ENCOUNTER — Other Ambulatory Visit: Payer: Self-pay

## 2023-06-29 ENCOUNTER — Emergency Department (HOSPITAL_BASED_OUTPATIENT_CLINIC_OR_DEPARTMENT_OTHER): Payer: Medicare Other

## 2023-06-29 ENCOUNTER — Emergency Department (HOSPITAL_BASED_OUTPATIENT_CLINIC_OR_DEPARTMENT_OTHER)
Admission: EM | Admit: 2023-06-29 | Discharge: 2023-06-29 | Disposition: A | Discharge status: Home / Self Care | Payer: Medicare Other | Attending: Emergency Medicine | Admitting: Emergency Medicine

## 2023-06-29 DIAGNOSIS — E1122 Type 2 diabetes mellitus with diabetic chronic kidney disease: Secondary | ICD-10-CM | POA: Diagnosis not present

## 2023-06-29 DIAGNOSIS — Z8551 Personal history of malignant neoplasm of bladder: Secondary | ICD-10-CM | POA: Diagnosis not present

## 2023-06-29 DIAGNOSIS — Z79899 Other long term (current) drug therapy: Secondary | ICD-10-CM | POA: Insufficient documentation

## 2023-06-29 DIAGNOSIS — I129 Hypertensive chronic kidney disease with stage 1 through stage 4 chronic kidney disease, or unspecified chronic kidney disease: Secondary | ICD-10-CM | POA: Insufficient documentation

## 2023-06-29 DIAGNOSIS — Z7982 Long term (current) use of aspirin: Secondary | ICD-10-CM | POA: Diagnosis not present

## 2023-06-29 DIAGNOSIS — Z794 Long term (current) use of insulin: Secondary | ICD-10-CM | POA: Insufficient documentation

## 2023-06-29 DIAGNOSIS — K859 Acute pancreatitis without necrosis or infection, unspecified: Secondary | ICD-10-CM | POA: Insufficient documentation

## 2023-06-29 DIAGNOSIS — N189 Chronic kidney disease, unspecified: Secondary | ICD-10-CM | POA: Insufficient documentation

## 2023-06-29 DIAGNOSIS — R1030 Lower abdominal pain, unspecified: Secondary | ICD-10-CM | POA: Diagnosis present

## 2023-06-29 LAB — COMPREHENSIVE METABOLIC PANEL
ALT: 16 U/L (ref 0–44)
AST: 17 U/L (ref 15–41)
Albumin: 3.9 g/dL (ref 3.5–5.0)
Alkaline Phosphatase: 96 U/L (ref 38–126)
Anion gap: 12 (ref 5–15)
BUN: 35 mg/dL — ABNORMAL HIGH (ref 8–23)
CO2: 18 mmol/L — ABNORMAL LOW (ref 22–32)
Calcium: 10.3 mg/dL (ref 8.9–10.3)
Chloride: 104 mmol/L (ref 98–111)
Creatinine, Ser: 2.35 mg/dL — ABNORMAL HIGH (ref 0.61–1.24)
GFR, Estimated: 26 mL/min — ABNORMAL LOW (ref >60)
Glucose, Bld: 288 mg/dL — ABNORMAL HIGH (ref 70–99)
Potassium: 4.7 mmol/L (ref 3.5–5.1)
Sodium: 134 mmol/L — ABNORMAL LOW (ref 135–145)
Total Bilirubin: 0.9 mg/dL (ref 0.3–1.2)
Total Protein: 7.7 g/dL (ref 6.5–8.1)

## 2023-06-29 LAB — CBC
HCT: 40.1 % (ref 39.0–52.0)
Hemoglobin: 13 g/dL (ref 13.0–17.0)
MCH: 27.7 pg (ref 26.0–34.0)
MCHC: 32.4 g/dL (ref 30.0–36.0)
MCV: 85.3 fL (ref 80.0–100.0)
Platelets: 235 10*3/uL (ref 150–400)
RBC: 4.7 MIL/uL (ref 4.22–5.81)
RDW: 15.7 % — ABNORMAL HIGH (ref 11.5–15.5)
WBC: 10.9 10*3/uL — ABNORMAL HIGH (ref 4.0–10.5)
nRBC: 0 % (ref 0.0–0.2)

## 2023-06-29 LAB — URINALYSIS, ROUTINE W REFLEX MICROSCOPIC
Bilirubin Urine: NEGATIVE
Glucose, UA: >=500 mg/dL — AB
Ketones, ur: NEGATIVE mg/dL
Leukocytes,Ua: NEGATIVE
Nitrite: NEGATIVE
Protein, ur: 100 mg/dL — AB
Specific Gravity, Urine: 1.02 (ref 1.005–1.030)
pH: 6 (ref 5.0–8.0)

## 2023-06-29 LAB — URINALYSIS, MICROSCOPIC (REFLEX)

## 2023-06-29 LAB — LIPASE, BLOOD: Lipase: 396 U/L — ABNORMAL HIGH (ref 11–51)

## 2023-06-29 MED ORDER — ONDANSETRON HCL 4 MG/2ML IJ SOLN
4.0000 mg | Freq: Once | INTRAMUSCULAR | Status: AC
  Administered 2023-06-29: 4 mg via INTRAVENOUS
  Filled 2023-06-29: qty 2

## 2023-06-29 MED ORDER — DICYCLOMINE HCL 10 MG/ML IM SOLN
20.0000 mg | Freq: Once | INTRAMUSCULAR | Status: AC
  Administered 2023-06-29: 20 mg via INTRAMUSCULAR
  Filled 2023-06-29: qty 2

## 2023-06-29 MED ORDER — SODIUM CHLORIDE 0.9 % IV BOLUS
1000.0000 mL | Freq: Once | INTRAVENOUS | Status: AC
  Administered 2023-06-29: 1000 mL via INTRAVENOUS

## 2023-06-29 MED ORDER — ONDANSETRON HCL 4 MG PO TABS: 4.0000 mg | ORAL_TABLET | Freq: Three times a day (TID) | ORAL | 0 refills | Status: AC | PRN

## 2023-06-29 MED ORDER — DICYCLOMINE HCL 20 MG PO TABS: 20.0000 mg | ORAL_TABLET | Freq: Two times a day (BID) | ORAL | 0 refills | Status: DC

## 2023-06-29 NOTE — ED Provider Notes (Signed)
[facility] Provider Note   CSN: [id number] Arrival date & time: 06/29/23  1136     History  Chief Complaint  Patient presents with   Abdominal Pain    Keith Drake is a 88 y.o. male with a history of bladder cancer, hypertension, type 2 diabetes mellitus, CKD, and spondylolisthesis who presents to the ED today for abdominal pain. Patient reports achy pain localized to his lower abdomen that started yesterday around noon.  The pain is persistent ever since.  Denies changes to bowel habits or pain with urination.  No fever, nausea, or vomiting.  He felt like he needed to throw up this morning but was not able to. Patient has not eaten due to the discomfort.  Patient reports chronic back pain but denies any new or worsening symptoms at this time.  No dizziness or weakness.  He has not tried taking any medication for symptoms. Denies any aggravating factors. No additional concerns or complaints at this time.    Home Medications Prior to Admission medications   Medication Sig Start Date End Date Taking? Authorizing Provider  dicyclomine (BENTYL) 20 MG tablet Take 1 tablet (20 mg total) by mouth 2 (two) times daily for 14 days. 06/29/23 07/13/23 Yes Maxwell Marion, PA-C  ondansetron (ZOFRAN) 4 MG tablet Take 1 tablet (4 mg total) by mouth every 8 (eight) hours as needed for up to 14 days for nausea or vomiting. 06/29/23 07/13/23 Yes Maxwell Marion, PA-C  acetaminophen (TYLENOL) 500 MG tablet Take 1,000 mg by mouth every 6 (six) hours as needed for mild pain.    [provider]  allopurinol (ZYLOPRIM) 100 MG tablet Take 100 mg by mouth daily. 07/07/18   [provider]  amLODipine (NORVASC) 10 MG tablet Take 10 mg by mouth daily.  12/09/15   [provider]  aspirin 81 MG tablet Take 81 mg by mouth daily.    [provider]  HUMULIN 70/30 (70-30) 100 UNIT/ML injection Inject 15-40 Units into the skin 2 (two) times daily with  a meal. Inject 40 units with breakfast and 15 units with supper. 12/24/15   [provider]  minocycline (MINOCIN,DYNACIN) 100 MG capsule Take 100 mg by mouth daily.  02/11/13   [provider]  oxyCODONE 10 MG TABS Take 1 tablet (10 mg total) by mouth every 3 (three) hours as needed for severe pain ((score 7 to 10)). 08/16/18   Donalee Citrin, MD  rosuvastatin (CRESTOR) 40 MG tablet Take 40 mg by mouth daily.    [provider]      Allergies    Fosinopril and Fenofibrate    Review of Systems   Review of Systems  Gastrointestinal:  Positive for abdominal pain.  All other systems reviewed and are negative.   Physical Exam Updated Vital Signs BP (!) 180/79   Pulse 65   Temp 97.9 F (36.6 C)   Resp 16   Ht 5\' 11"  (1.803 m)   Wt 79.4 kg   SpO2 100%   BMI 24.41 kg/m  Physical Exam Vitals and nursing note reviewed.  Constitutional:      General: He is not in acute distress.    Appearance: Normal appearance.  HENT:     Head: Normocephalic and atraumatic.     Mouth/Throat:     Mouth: Mucous membranes are moist.  Eyes:     Conjunctiva/sclera: Conjunctivae normal.     Pupils: Pupils are equal, round, and reactive to light.  Cardiovascular:     Rate and Rhythm: Normal rate and regular rhythm.     Pulses: Normal pulses.     Heart sounds: Normal heart sounds.  Pulmonary:     Effort: Pulmonary effort is normal.     Breath sounds: Normal breath sounds.  Abdominal:     Palpations: Abdomen is soft.     Tenderness: There is abdominal tenderness. There is no right CVA tenderness, left CVA tenderness, guarding or rebound.     Comments: Suprapubic tenderness   Skin:    General: Skin is warm and dry.     Findings: No rash.  Neurological:     General: No focal deficit present.     Mental Status: He is alert.     Sensory: No sensory deficit.     Motor: No weakness.  Psychiatric:        Mood and Affect: Mood normal.        Behavior: Behavior normal.     ED  Results / Procedures / Treatments   Labs (all labs ordered are listed, but only abnormal results are displayed) Labs Reviewed  LIPASE, BLOOD - Abnormal; Notable for the following components:      Result Value   Lipase 396 (*)    All other components within normal limits  COMPREHENSIVE METABOLIC PANEL - Abnormal; Notable for the following components:   Sodium 134 (*)    CO2 18 (*)    Glucose, Bld 288 (*)    BUN 35 (*)    Creatinine, Ser 2.35 (*)    GFR, Estimated 26 (*)    All other components within normal limits  CBC - Abnormal; Notable for the following components:   WBC 10.9 (*)    RDW 15.7 (*)    All other components within normal limits  URINALYSIS, ROUTINE W REFLEX MICROSCOPIC - Abnormal; Notable for the following components:   Glucose, UA >=500 (*)    Hgb urine dipstick TRACE (*)    Protein, ur 100 (*)    All other components within normal limits  URINALYSIS, MICROSCOPIC (REFLEX) - Abnormal; Notable for the following components:   Bacteria, UA RARE (*)    All other components within normal limits    EKG None  Radiology CT ABDOMEN PELVIS WO CONTRAST  Result Date: 06/29/2023 CLINICAL DATA:  Acute nonlocalized abdominal pain. EXAM: CT ABDOMEN AND PELVIS WITHOUT CONTRAST TECHNIQUE: Multidetector CT imaging of the abdomen and pelvis was performed following the standard protocol without IV contrast. RADIATION DOSE REDUCTION: This exam was performed according to the departmental dose-optimization program which includes automated exposure control, adjustment of the mA and/or kV according to patient size and/or use of iterative reconstruction technique. COMPARISON:  02/17/2011 FINDINGS: Lower chest: Mild dependent atelectasis in the lung bases. Hepatobiliary: Several focal lesions in the liver, 3 subcentimeter lesions in the dome of the liver and a 10 mm lesion in the right lobe. No significant change since previous study. These likely represent cysts or hemangiomas. Gallbladder and  bile ducts are normal. Pancreas: Infiltration diffusely around the pancreas consistent with peripancreatic edema. This likely indicates acute pancreatitis. No loculated collections or pancreatic ductal dilatation or demonstrated. Spleen: Normal in size without focal abnormality. Adrenals/Urinary Tract: No adrenal gland nodules. Punctate sized calcifications in both renal lower poles. No hydronephrosis or hydroureter. Bladder is normal. Stomach/Bowel: Stomach, small bowel, and colon are not abnormally distended. No wall thickening is appreciated. Appendix is not identified. Vascular/Lymphatic: Aortic atherosclerosis. No enlarged abdominal or pelvic lymph nodes. Reproductive:  Prostate is unremarkable. Other: No abdominal wall hernia or abnormality. No abdominopelvic ascites. Musculoskeletal: Degenerative changes in the spine and hips. Postoperative fixation in the lower lumbar spine. IMPRESSION: 1. Peripancreatic infiltration consistent with acute pancreatitis. No loculated collections identified. 2. Nonobstructing stones in the kidneys. 3. Aortic atherosclerosis. Electronically Signed   By: Burman Nieves M.D.   On: 06/29/2023 17:14    Procedures Procedures: not indicated.   Medications Ordered in ED Medications  ondansetron (ZOFRAN) injection 4 mg (4 mg Intravenous Given 06/29/23 1455)  sodium chloride 0.9 % bolus 1,000 mL (0 mLs Intravenous Stopped 06/29/23 1619)  dicyclomine (BENTYL) injection 20 mg (20 mg Intramuscular Given 06/29/23 1457)    ED Course/ Medical Decision Making/ A&P                                 Medical Decision Making Amount and/or Complexity of Data Reviewed Labs: ordered. Radiology: ordered.  Risk Prescription drug management.   This patient presents to the ED for concern of abdominal pain, this involves an extensive number of treatment options, and is a complaint that carries with it a high risk of complications and morbidity.   Differential diagnosis includes:  gastritis, gastroenteritis, UTI, pyelonephritis, IBS, IBD, diverticulitis, constipation, etc.   Comorbidities  See HPI above   Additional History  Additional history obtained from patient's previous records.   Lab Tests  I ordered and personally interpreted labs.  The pertinent results include: Elevated glucose of 288, BUN of 35 and creatinine of 2.35, GFR of 26, otherwise CMP is within normal limits.  Patient does have a history of CKD. Elevated lipase of 396 CBC shows elevated white count of 10.9 otherwise within normal limits.  No acute anemia.   Imaging Studies  I ordered imaging studies including CT abdomen/pelvis without contrast  I independently visualized and interpreted imaging which showed: Peripancreatic infiltration consistent with acute pancreatitis.  No loculated collections identified.  Nonobstructing stones in the kidneys.  Aortic atherosclerosis. I agree with the radiologist interpretation   Problem List / ED Course / Critical Interventions / Medication Management  Abdominal pain x2 days I ordered medications including: Bentyl, Zofran, and NS for pain Reevaluation of the patient after these medicines showed that the patient improved I have reviewed the patients home medicines and have made adjustments as needed   Social Determinants of Health  Tobacco use   Test / Admission - Considered  Discussed results with patient. He is hemodynamically stable.  With shared decision making it has been decided that patient will go home and have close follow-up with his PCP to reevaluate his pancreatitis. Prescription for Bentyl and Zofran sent to the pharmacy. Return precautions provided.       Final Clinical Impression(s) / ED Diagnoses Final diagnoses:  Acute pancreatitis, unspecified complication status, unspecified pancreatitis type    Rx / DC Orders ED Discharge Orders          Ordered    dicyclomine (BENTYL) 20 MG tablet  2 times daily         06/29/23 1754    ondansetron (ZOFRAN) 4 MG tablet  Every 8 hours PRN        06/29/23 1754              Maxwell Marion, PA-C 06/29/23 1837    Rolan Bucco, MD 07/07/23 1624

## 2023-06-29 NOTE — ED Notes (Signed)
ED Provider at bedside. 

## 2023-06-29 NOTE — ED Triage Notes (Addendum)
Pt states across abd pain since yesterday denies diarrhea or n/v has back trouble also states stomach is swollen

## 2023-06-29 NOTE — ED Notes (Signed)
Reviewed discharge instructions .States understanding Assisted pt to vehicle. Ambulatory with cane

## 2023-06-29 NOTE — Discharge Instructions (Addendum)
As discussed, you have pancreatitis (inflammation of your pancreas). Since you are feeling better after medication administration in the ED, we feel comfortable sending you home but you need to call your family doctor tomorrow to schedule an appointment for reevaluation.   I have sent a prescription of Zofran to take for nausea and Bentyl for abdominal pain. You can take these medications as needed.  You need to have a clear liquid diet for the next several days since you have pancreatitis. I have attached information for the diet in the paperwork.  Get help right away if: You vomit every time you eat or drink. Your pain gets very bad. Your skin or the white parts of your eyes turn yellow. You have sudden swelling in your belly. You feel dizzy or you faint. Your blood sugar is high (over 300 mg/dL). You vomit blood.

## 2023-07-13 ENCOUNTER — Telehealth: Payer: Self-pay

## 2023-07-13 NOTE — Telephone Encounter (Signed)
I spoke with Erie Noe and she said yes they will need device clearance.

## 2023-07-13 NOTE — Telephone Encounter (Signed)
Pre-operative Risk Assessment    Patient Name: Keith Drake  DOB: 03-07-1935 MRN: [id number]      Request for Surgical Clearance    Procedure:   L4-5 Lumbar Fusion  Date of Surgery:  Clearance TBD                                 Surgeon:  Jillyn Hidden P. Wynetta Emery, MD Surgeon's Group or Practice Name:  [facility] Phone number:  [phone] Milinda Pointer number:  [phone]   Type of Clearance Requested:   - Medical  - Pharmacy:  Hold Aspirin pt will instructions on when/if to hold   Type of Anesthesia:  General    Additional requests/questions:    Signed, Zada Finders   07/13/2023, 11:02 AM

## 2023-07-13 NOTE — Telephone Encounter (Signed)
Name: Keith Drake  DOB: September 18, 1935  MRN: [id number]  Primary Cardiologist: Thurmon Fair, MD  Chart reviewed as part of pre-operative protocol coverage. Because of [name] past medical history and time since last visit, he will require a follow-up telephone visit in order to better assess preoperative cardiovascular risk.  Pre-op covering staff: - Please schedule appointment and call patient to inform them. If patient already had an upcoming appointment within acceptable timeframe, please add "pre-op clearance" to the appointment notes so provider is aware. - Please contact requesting surgeon's office via preferred method (i.e, phone, fax) to inform them of need for appointment prior to surgery.  He can hold ASA for 5-7 days as long as no symptoms during interview.  Sharlene Dory, PA-C  07/13/2023, 1:37 PM

## 2023-07-14 NOTE — Telephone Encounter (Signed)
Attempted patient both numbers home number memory full main number disconnected

## 2023-07-19 ENCOUNTER — Other Ambulatory Visit: Payer: Self-pay

## 2023-07-19 ENCOUNTER — Emergency Department (HOSPITAL_BASED_OUTPATIENT_CLINIC_OR_DEPARTMENT_OTHER): Payer: Medicare Other

## 2023-07-19 ENCOUNTER — Inpatient Hospital Stay (HOSPITAL_BASED_OUTPATIENT_CLINIC_OR_DEPARTMENT_OTHER)
Admission: EM | Admit: 2023-07-19 | Discharge: 2023-07-21 | DRG: 439 | Disposition: A | Discharge status: Home Health | Payer: Medicare Other | Attending: Internal Medicine | Admitting: Internal Medicine

## 2023-07-19 ENCOUNTER — Encounter (HOSPITAL_BASED_OUTPATIENT_CLINIC_OR_DEPARTMENT_OTHER): Payer: Self-pay | Admitting: Emergency Medicine

## 2023-07-19 DIAGNOSIS — Z888 Allergy status to other drugs, medicaments and biological substances status: Secondary | ICD-10-CM

## 2023-07-19 DIAGNOSIS — Z950 Presence of cardiac pacemaker: Secondary | ICD-10-CM

## 2023-07-19 DIAGNOSIS — N184 Chronic kidney disease, stage 4 (severe): Secondary | ICD-10-CM | POA: Diagnosis present

## 2023-07-19 DIAGNOSIS — Z800 Family history of malignant neoplasm of digestive organs: Secondary | ICD-10-CM

## 2023-07-19 DIAGNOSIS — I441 Atrioventricular block, second degree: Secondary | ICD-10-CM | POA: Diagnosis present

## 2023-07-19 DIAGNOSIS — E875 Hyperkalemia: Secondary | ICD-10-CM | POA: Diagnosis not present

## 2023-07-19 DIAGNOSIS — D631 Anemia in chronic kidney disease: Secondary | ICD-10-CM | POA: Diagnosis present

## 2023-07-19 DIAGNOSIS — I10 Essential (primary) hypertension: Secondary | ICD-10-CM | POA: Diagnosis present

## 2023-07-19 DIAGNOSIS — I4510 Unspecified right bundle-branch block: Secondary | ICD-10-CM | POA: Diagnosis present

## 2023-07-19 DIAGNOSIS — H9193 Unspecified hearing loss, bilateral: Secondary | ICD-10-CM | POA: Diagnosis present

## 2023-07-19 DIAGNOSIS — E1122 Type 2 diabetes mellitus with diabetic chronic kidney disease: Secondary | ICD-10-CM | POA: Diagnosis present

## 2023-07-19 DIAGNOSIS — N1832 Chronic kidney disease, stage 3b: Secondary | ICD-10-CM | POA: Diagnosis present

## 2023-07-19 DIAGNOSIS — Z87442 Personal history of urinary calculi: Secondary | ICD-10-CM

## 2023-07-19 DIAGNOSIS — Z86010 Personal history of colonic polyps: Secondary | ICD-10-CM

## 2023-07-19 DIAGNOSIS — M109 Gout, unspecified: Secondary | ICD-10-CM | POA: Diagnosis present

## 2023-07-19 DIAGNOSIS — Z87891 Personal history of nicotine dependence: Secondary | ICD-10-CM | POA: Diagnosis not present

## 2023-07-19 DIAGNOSIS — I2489 Other forms of acute ischemic heart disease: Secondary | ICD-10-CM | POA: Diagnosis present

## 2023-07-19 DIAGNOSIS — Z7982 Long term (current) use of aspirin: Secondary | ICD-10-CM

## 2023-07-19 DIAGNOSIS — N179 Acute kidney failure, unspecified: Secondary | ICD-10-CM | POA: Diagnosis present

## 2023-07-19 DIAGNOSIS — K5900 Constipation, unspecified: Secondary | ICD-10-CM | POA: Diagnosis present

## 2023-07-19 DIAGNOSIS — Z79899 Other long term (current) drug therapy: Secondary | ICD-10-CM | POA: Diagnosis not present

## 2023-07-19 DIAGNOSIS — Z8551 Personal history of malignant neoplasm of bladder: Secondary | ICD-10-CM | POA: Diagnosis not present

## 2023-07-19 DIAGNOSIS — E785 Hyperlipidemia, unspecified: Secondary | ICD-10-CM | POA: Diagnosis present

## 2023-07-19 DIAGNOSIS — N189 Chronic kidney disease, unspecified: Secondary | ICD-10-CM | POA: Diagnosis present

## 2023-07-19 DIAGNOSIS — I129 Hypertensive chronic kidney disease with stage 1 through stage 4 chronic kidney disease, or unspecified chronic kidney disease: Secondary | ICD-10-CM | POA: Diagnosis present

## 2023-07-19 DIAGNOSIS — Z794 Long term (current) use of insulin: Secondary | ICD-10-CM | POA: Diagnosis not present

## 2023-07-19 DIAGNOSIS — Z8701 Personal history of pneumonia (recurrent): Secondary | ICD-10-CM

## 2023-07-19 DIAGNOSIS — Z833 Family history of diabetes mellitus: Secondary | ICD-10-CM

## 2023-07-19 DIAGNOSIS — E1165 Type 2 diabetes mellitus with hyperglycemia: Secondary | ICD-10-CM | POA: Diagnosis present

## 2023-07-19 DIAGNOSIS — K859 Acute pancreatitis without necrosis or infection, unspecified: Secondary | ICD-10-CM | POA: Diagnosis present

## 2023-07-19 DIAGNOSIS — E872 Acidosis, unspecified: Secondary | ICD-10-CM | POA: Diagnosis present

## 2023-07-19 DIAGNOSIS — K8500 Idiopathic acute pancreatitis without necrosis or infection: Secondary | ICD-10-CM | POA: Diagnosis not present

## 2023-07-19 DIAGNOSIS — E119 Type 2 diabetes mellitus without complications: Secondary | ICD-10-CM

## 2023-07-19 DIAGNOSIS — E441 Mild protein-calorie malnutrition: Secondary | ICD-10-CM | POA: Diagnosis present

## 2023-07-19 DIAGNOSIS — Z6824 Body mass index (BMI) 24.0-24.9, adult: Secondary | ICD-10-CM

## 2023-07-19 DIAGNOSIS — R2689 Other abnormalities of gait and mobility: Secondary | ICD-10-CM | POA: Insufficient documentation

## 2023-07-19 DIAGNOSIS — N200 Calculus of kidney: Secondary | ICD-10-CM | POA: Diagnosis present

## 2023-07-19 DIAGNOSIS — L719 Rosacea, unspecified: Secondary | ICD-10-CM | POA: Diagnosis present

## 2023-07-19 DIAGNOSIS — Z974 Presence of external hearing-aid: Secondary | ICD-10-CM

## 2023-07-19 LAB — CBC
HCT: 35.8 % — ABNORMAL LOW (ref 39.0–52.0)
Hemoglobin: 11.4 g/dL — ABNORMAL LOW (ref 13.0–17.0)
MCH: 27.7 pg (ref 26.0–34.0)
MCHC: 31.8 g/dL (ref 30.0–36.0)
MCV: 86.9 fL (ref 80.0–100.0)
Platelets: 270 10*3/uL (ref 150–400)
RBC: 4.12 MIL/uL — ABNORMAL LOW (ref 4.22–5.81)
RDW: 16.1 % — ABNORMAL HIGH (ref 11.5–15.5)
WBC: 8.9 10*3/uL (ref 4.0–10.5)
nRBC: 0 % (ref 0.0–0.2)

## 2023-07-19 LAB — COMPREHENSIVE METABOLIC PANEL
ALT: 16 U/L (ref 0–44)
AST: 16 U/L (ref 15–41)
Albumin: 3.4 g/dL — ABNORMAL LOW (ref 3.5–5.0)
Alkaline Phosphatase: 78 U/L (ref 38–126)
Anion gap: 12 (ref 5–15)
BUN: 36 mg/dL — ABNORMAL HIGH (ref 8–23)
CO2: 17 mmol/L — ABNORMAL LOW (ref 22–32)
Calcium: 10.1 mg/dL (ref 8.9–10.3)
Chloride: 108 mmol/L (ref 98–111)
Creatinine, Ser: 2.29 mg/dL — ABNORMAL HIGH (ref 0.61–1.24)
GFR, Estimated: 27 mL/min — ABNORMAL LOW (ref >60)
Glucose, Bld: 279 mg/dL — ABNORMAL HIGH (ref 70–99)
Potassium: 4.8 mmol/L (ref 3.5–5.1)
Sodium: 137 mmol/L (ref 135–145)
Total Bilirubin: 0.6 mg/dL (ref 0.3–1.2)
Total Protein: 6.6 g/dL (ref 6.5–8.1)

## 2023-07-19 LAB — GLUCOSE, CAPILLARY: Glucose-Capillary: 302 mg/dL — ABNORMAL HIGH (ref 70–99)

## 2023-07-19 LAB — LIPASE, BLOOD: Lipase: 583 U/L — ABNORMAL HIGH (ref 11–51)

## 2023-07-19 LAB — CBG MONITORING, ED: Glucose-Capillary: 264 mg/dL — ABNORMAL HIGH (ref 70–99)

## 2023-07-19 LAB — HEMOGLOBIN A1C
Hgb A1c MFr Bld: 9.1 % — ABNORMAL HIGH (ref 4.8–5.6)
Mean Plasma Glucose: 214.47 mg/dL

## 2023-07-19 LAB — TROPONIN I (HIGH SENSITIVITY)
Troponin I (High Sensitivity): 37 ng/L — ABNORMAL HIGH (ref <18)
Troponin I (High Sensitivity): 28 ng/L — ABNORMAL HIGH (ref <18)

## 2023-07-19 MED ORDER — INSULIN ASPART 100 UNIT/ML IJ SOLN: 0.0000 [IU] | Freq: Three times a day (TID) | INTRAMUSCULAR | Status: DC

## 2023-07-19 MED ORDER — LABETALOL HCL 5 MG/ML IV SOLN: 10.0000 mg | INTRAVENOUS | Status: DC | PRN

## 2023-07-19 MED ORDER — ACETAMINOPHEN 325 MG PO TABS: 650.0000 mg | ORAL_TABLET | Freq: Four times a day (QID) | ORAL | Status: DC | PRN

## 2023-07-19 MED ORDER — AMLODIPINE BESYLATE 5 MG PO TABS: 5.0000 mg | ORAL_TABLET | Freq: Every day | ORAL | Status: DC

## 2023-07-19 MED ORDER — SODIUM CHLORIDE 0.9 % IV SOLN: Freq: Once | INTRAVENOUS | Status: AC

## 2023-07-19 MED ORDER — ONDANSETRON HCL 4 MG/2ML IJ SOLN
4.0000 mg | Freq: Once | INTRAMUSCULAR | Status: AC
  Administered 2023-07-19: 4 mg via INTRAVENOUS
  Filled 2023-07-19: qty 2

## 2023-07-19 MED ORDER — HYDROMORPHONE HCL 1 MG/ML IJ SOLN: 0.5000 mg | INTRAMUSCULAR | Status: DC | PRN

## 2023-07-19 MED ORDER — NITROGLYCERIN 0.4 MG SL SUBL
0.4000 mg | SUBLINGUAL_TABLET | SUBLINGUAL | Status: DC | PRN
  Administered 2023-07-19 (×3): 0.4 mg via SUBLINGUAL

## 2023-07-19 MED ORDER — MORPHINE SULFATE (PF) 4 MG/ML IV SOLN
4.0000 mg | Freq: Once | INTRAVENOUS | Status: AC
  Administered 2023-07-19: 4 mg via INTRAVENOUS
  Filled 2023-07-19: qty 1

## 2023-07-19 MED ORDER — ONDANSETRON HCL 4 MG/2ML IJ SOLN: 4.0000 mg | Freq: Four times a day (QID) | INTRAMUSCULAR | Status: DC | PRN

## 2023-07-19 MED ORDER — PANTOPRAZOLE SODIUM 40 MG IV SOLR
40.0000 mg | Freq: Every day | INTRAVENOUS | Status: DC
  Administered 2023-07-19 – 2023-07-21 (×3): 40 mg via INTRAVENOUS
  Filled 2023-07-19 (×3): qty 10

## 2023-07-19 MED ORDER — INSULIN ASPART 100 UNIT/ML IJ SOLN
3.0000 [IU] | Freq: Once | INTRAMUSCULAR | Status: AC
  Administered 2023-07-19: 3 [IU] via SUBCUTANEOUS

## 2023-07-19 MED ORDER — FENTANYL CITRATE PF 50 MCG/ML IJ SOSY: 50.0000 ug | PREFILLED_SYRINGE | Freq: Once | INTRAMUSCULAR | Status: DC

## 2023-07-19 MED ORDER — ONDANSETRON HCL 4 MG PO TABS: 4.0000 mg | ORAL_TABLET | Freq: Four times a day (QID) | ORAL | Status: DC | PRN

## 2023-07-19 MED ORDER — AMLODIPINE BESYLATE 5 MG PO TABS
5.0000 mg | ORAL_TABLET | Freq: Every day | ORAL | Status: DC
  Administered 2023-07-20 – 2023-07-21 (×2): 5 mg via ORAL
  Filled 2023-07-19 (×2): qty 1

## 2023-07-19 MED ORDER — ASPIRIN 81 MG PO CHEW
324.0000 mg | CHEWABLE_TABLET | Freq: Once | ORAL | Status: AC
  Administered 2023-07-19: 324 mg via ORAL
  Filled 2023-07-19: qty 4

## 2023-07-19 MED ORDER — ACETAMINOPHEN 650 MG RE SUPP: 650.0000 mg | Freq: Four times a day (QID) | RECTAL | Status: DC | PRN

## 2023-07-19 MED ORDER — AMLODIPINE BESYLATE 5 MG PO TABS
10.0000 mg | ORAL_TABLET | Freq: Once | ORAL | Status: AC
  Administered 2023-07-19: 10 mg via ORAL
  Filled 2023-07-19: qty 2

## 2023-07-19 MED ORDER — SODIUM CHLORIDE 0.45 % IV SOLN: INTRAVENOUS | Status: DC

## 2023-07-19 NOTE — ED Triage Notes (Signed)
Pt ambulatory to exam room 9 with c/o vertigo that started Saturday and mid sternal CP that started this AM.  Pt denies SHOB or nausea.

## 2023-07-19 NOTE — ED Notes (Signed)
[facility] at bedside 

## 2023-07-19 NOTE — ED Notes (Signed)
Attempted report x2, receiving RN unavailable.

## 2023-07-19 NOTE — Progress Notes (Signed)
[facility] Transfer acceptance note  Transferring facility: [facility] ED Transferring provider: Arthor Captain, ED PA-C  Accepting facility: [facility], telemetry bed, inpatient Accepting provider: Dr. Marcellus Scott  88 year old male with PMH of type II DM, HTN, HLD, stage IIIb CKD, s/p PPM, seen in ED on 9/3 for abdominal pain, diagnosed with acute pancreatitis based on elevated lipase and CT abdomen, discharged home with conservative management, return to ED on 07/19/2023 with complaints of chest pain and abdominal pain.    Initial BP 202/98, likely due to pain-better after pain management and sublingual NTG.  Most recent BP 175/93. Labs significant for bicarbonate 17, glucose 279, BUN 36, creatinine 2.29, lipase up from 396 on 9/3 to 583.  High-sensitivity troponin 37 > 28.  EKG shows biventricular paced rhythm and BBB morphology.  Hemoglobin 11.4.  Chest x-ray without acute findings.  CT [place] from 9/3 showed peripancreatic inflammation consistent with acute pancreatitis but no collections.  Repeat CT abdomen pending.  S/p aspirin 324 Mg x 1 2, Fentanyl IV 50 mcg x 1, IV morphine 4 mg x 1, IV Zofran 4 mg x 1 and sublingual NTG x 3 doses.  A/P: Acute pancreatitis.  Follow CT A/P results.  N.p.o. until assessment by admitting MD.  Multimodality pain control. Atypical chest pain: Likely related to abdominal pain from acute pancreatitis.  Mildly elevated troponin with flat trend likely related to demand ischemia and chronic kidney disease. Stage IIIb CKD: Creatinine at baseline.  When patient arrives to [facility], needs H&P and admit orders.  Marcellus Scott, MD,  FACP, [facility], [facility], [facility]   [facility] Advisor [place]     To contact the attending provider between 7A-7P or the covering provider during after hours 7P-7A, please log into the web site [email or web address] and access using universal [place] password for that web site. If you do not have the password,  please call the hospital operator.

## 2023-07-19 NOTE — ED Notes (Signed)
Attempted report x1, no answer by receiving RN .

## 2023-07-19 NOTE — ED Provider Notes (Signed)
[place] [facility] Provider Note   CSN: [id number] Arrival date & time: 07/19/23  7846     History  Chief Complaint  Patient presents with   Chest Pain   Dizziness    Keith Drake is a 88 y.o. male who presents with a cc of cp. He  has a past medical history of Anemia, Arthritis, Bladder cancer (HCC) (TCC), CKD (chronic kidney disease), stage III (HCC), History of gout, History of kidney stones, Hyperlipidemia, Hypertension, Impaired hearing (BILATERAL ), Mobitz I, Pneumonia ( ~ 1980s X 1), Presence of permanent cardiac pacemaker (07/20/2018), Right bundle branch block, Spondylolisthesis, and Type II diabetes mellitus (HCC).Onset 6:00 am- woke from sleep. Associated nausea without vomiting, diaphoresis, sob. Did not take his regular home meds. Patient reports recent bout with both pancreatitis and vertigo. Denies any increase in his vertigo sxs.   History limited by: Patient is extremely HOH, limited insight.  Chest Pain Pain location:  L chest, substernal area and epigastric Pain quality: not sharp, not shooting and not stabbing   Pain radiates to:  Does not radiate Pain severity:  Severe Onset quality:  Sudden Duration:  3 hours Timing:  Constant Progression:  Unchanged Chronicity:  New Worsened by:  Nothing Ineffective treatments:  None tried Associated symptoms: dizziness (unchanged) and nausea   Dizziness Associated symptoms: chest pain and nausea        Home Medications Prior to Admission medications   Medication Sig Start Date End Date Taking? Authorizing Provider  acetaminophen (TYLENOL) 500 MG tablet Take 1,000 mg by mouth every 6 (six) hours as needed for mild pain.    [provider]  allopurinol (ZYLOPRIM) 100 MG tablet Take 100 mg by mouth daily. 07/07/18   [provider]  amLODipine (NORVASC) 10 MG tablet Take 10 mg by mouth daily.  12/09/15   [provider]  aspirin 81 MG tablet Take 81 mg  by mouth daily.    [provider]  dicyclomine (BENTYL) 20 MG tablet Take 1 tablet (20 mg total) by mouth 2 (two) times daily for 14 days. 06/29/23 07/13/23  Maxwell Marion, PA-C  HUMULIN 70/30 (70-30) 100 UNIT/ML injection Inject 15-40 Units into the skin 2 (two) times daily with a meal. Inject 40 units with breakfast and 15 units with supper. 12/24/15   [provider]  minocycline (MINOCIN,DYNACIN) 100 MG capsule Take 100 mg by mouth daily.  02/11/13   [provider]  oxyCODONE 10 MG TABS Take 1 tablet (10 mg total) by mouth every 3 (three) hours as needed for severe pain ((score 7 to 10)). 08/16/18   Donalee Citrin, MD  rosuvastatin (CRESTOR) 40 MG tablet Take 40 mg by mouth daily.    [provider]      Allergies    Fosinopril and Fenofibrate    Review of Systems   Review of Systems  Cardiovascular:  Positive for chest pain.  Gastrointestinal:  Positive for nausea.  Neurological:  Positive for dizziness (unchanged).    Physical Exam Updated Vital Signs BP (!) 202/98   Pulse 70   Temp 98.4 F (36.9 C)   Resp 15   Ht 5\' 11"  (1.803 m)   Wt 79.4 kg   SpO2 100%   BMI 24.41 kg/m  Physical Exam Vitals and nursing note reviewed.  Constitutional:      General: He is not in acute distress.    Appearance: He is well-developed. He is not diaphoretic.  HENT:  Head: Normocephalic and atraumatic.  Eyes:     General: No scleral icterus.    Conjunctiva/sclera: Conjunctivae normal.  Cardiovascular:     Rate and Rhythm: Normal rate and regular rhythm.     Heart sounds: Normal heart sounds.  Pulmonary:     Effort: Pulmonary effort is normal. No respiratory distress.     Breath sounds: Normal breath sounds.  Abdominal:     Palpations: Abdomen is soft.     Tenderness: There is no abdominal tenderness.  Musculoskeletal:     Cervical back: Normal range of motion and neck supple.  Skin:    General: Skin is warm and dry.  Neurological:     Mental  Status: He is alert.  Psychiatric:        Behavior: Behavior normal.     ED Results / Procedures / Treatments   Labs (all labs ordered are listed, but only abnormal results are displayed) Labs Reviewed - No data to display  EKG None  Radiology No results found.  Procedures Procedures    Medications Ordered in ED Medications - No data to display  ED Course/ Medical Decision Making/ A&P Clinical Course as of 07/19/23 1315  Mon Jul 19, 2023  1043 CBC(!) [AH]  1207 Creatinine(!): 2.29 Creatinine at baseline [AH]  1207 Troponin I (High Sensitivity)(!) Patient's troponin is 37.  He has chronically elevated troponin this is slightly higher than normal [AH]  1209 Lipase(!): 583 Patient's lipase is 583 up from previous lipase in the 300s.  This is likely the cause of his abdominal and chest pain. [AH]  1214 Patient reevaluated.  Still having significant pain.  Given findings of highly elevated lipase again feel this is related to worsening of his pancreatitis.  Patient now reports that pain is all the way down the left abdominal wall.  Will obtain CT scan.  Patient informed he will need admission and is in agreement.  Pain medications ordered [AH]    Clinical Course User Index [AH] Arthor Captain, PA-C                                 Medical Decision Making This patient presents to the ED for concern of cp, this involves an extensive number of treatment options, and is a complaint that carries with it a high risk of complications and morbidity.  The emergent differential diagnosis of chest pain includes: Acute coronary syndrome, pericarditis, aortic dissection, pulmonary embolism, tension pneumothorax, pneumonia, and esophageal rupture.    Co morbidities:     has a past medical history of Anemia, Arthritis, Bladder cancer (HCC) (TCC), CKD (chronic kidney disease), stage III (HCC), History of gout, History of kidney stones, Hyperlipidemia, Hypertension, Impaired hearing  (BILATERAL ), Mobitz I, Pneumonia ( ~ 1980s X 1), Presence of permanent cardiac pacemaker (07/20/2018), Right bundle branch block, Spondylolisthesis, and Type II diabetes mellitus (HCC).   Social Determinants of Health:       SDOH Screenings Tobacco Use: Medium Risk (07/19/2023)   Additional history:  {Additional history obtained from emr, recent ED visit {External records from outside source obtained and reviewed including outside Cardiovascular notes  Lab Tests:  I Ordered, and personally interpreted labs.  The pertinent results include:   Per ed course  Imaging Studies:  I ordered imaging studies including cxr I independently visualized and interpreted imaging which showed no acute findings I agree with the radiologist interpretation  Cardiac Monitoring/ECG:  The patient was maintained on a cardiac monitor.  I personally viewed and interpreted the cardiac monitored which showed an underlying rhythm of: paced rhythm   Medicines ordered and prescription drug management:  I ordered medication including Medications Medications nitroGLYCERIN (NITROSTAT) SL tablet 0.4 mg (0.4 mg Sublingual Given 07/19/23 1050) 0.9 %  sodium chloride infusion (has no administration in time range) aspirin chewable tablet 324 mg (324 mg Oral Given 07/19/23 1027) amLODipine (NORVASC) tablet 10 mg (10 mg Oral Given 07/19/23 1048) ondansetron (ZOFRAN) injection 4 mg (4 mg Intravenous Given 07/19/23 1048) morphine (PF) 4 MG/ML injection 4 mg (4 mg Intravenous Given 07/19/23 1217)  for abdominal pain and nausea Reevaluation of the patient after these medicines showed that the patient improved I have reviewed the patients home medicines and have made adjustments as needed  Test Considered:       CT abd w/o - findings pending  Critical Interventions:       fluids, pain meds  Consultations Obtained: Dr. Waymon Amato   Problem List / ED Course:       Acute pancreatitis, unspecified  complication status, unspecified pancreatitis type  (primary encounter diagnosis)   MDM: Here with epigastric abdominal pain and chest pain.  Patient appears to have worsening pancreatitis.  I have low suspicion for cardiac disease.  He does have elevated troponins however this is also in the setting of chronic renal insufficiency and is only a little bit above his normal elevated troponin values.  Certainly they should be trended.  Patient also was markedly hypertensive upon arrival with a lot of pain.  He may have had a little bit of demand ischemia secondary to that.  Doubt hypertensive emergency, dissection or other severe cause.  Patient's pain is significantly improved after morphine and will need admission.  He is no longer nauseous or retching.  Dispostion:  After consideration of the diagnostic results and the patients response to treatment, I feel that the patent would benefit from acute pancreatitis.    Amount and/or Complexity of Data Reviewed Labs: ordered. Decision-making details documented in ED Course. Radiology: ordered.  Risk OTC drugs. Prescription drug management. Decision regarding hospitalization.           Final Clinical Impression(s) / ED Diagnoses Final diagnoses:  Acute pancreatitis, unspecified complication status, unspecified pancreatitis type    Rx / DC Orders ED Discharge Orders     None         Arthor Captain, PA-C 07/19/23 1320    Melene Plan, DO 07/19/23 1321

## 2023-07-19 NOTE — ED Notes (Signed)
CBG- 264

## 2023-07-19 NOTE — ED Notes (Signed)
Fall risk armband Fall risk sign on door Patient wearing sneekers

## 2023-07-19 NOTE — H&P (Signed)
History and Physical    Patient: Keith Drake JYN:[id number] DOB: Feb 11, 1935 DOA: 07/19/2023 DOS: the patient was seen and examined on 07/19/2023 PCP: Rodrigo Ran, MD  Patient coming from: Home  Chief Complaint:  Chief Complaint  Patient presents with   Chest Pain   Dizziness   HPI: Keith Drake is a 88 y.o. male with medical history significant of anemia, bladder cancer, osteoarthritis, stage IV CKD, nephrolithiasis, hyperlipidemia, hypertension, RBBB, Mobitz 1 heart block, pacemaker placement, history of pneumonia, spondylolisthesis, type 2 diabetes who presented to the emergency department with complaints of abdominal/chest, nausea pain and dizziness since he woke up from sleep this morning. He has had recent episodes of pancreatitis and vertigo. He took some meclizine to relieve his symptoms. He denied fever, chills, rhinorrhea, sore throat, wheezing or hemoptysis. No palpitations, diaphoresis, PND, orthopnea or pitting edema of the lower extremities. No emesis, diarrhea, constipation, melena or hematochezia. No flank pain, dysuria, frequency or hematuria.  No polyuria, polydipsia, polyphagia or blurred vision.   Lab work: His CBC showed a white count of 8.9, hemoglobin 11.4 g/dL platelets 865.  Troponin was 37 then 28 ng/L.  Lipase 583 units/L.  CMP had a CO2 of 17 mmol/L with a normal anion gap.  Glucose 279, BUN 36, creatinine 2.29 mg/dL.  LFTs were normal except for an albumin of 3.4 g/dL.  Imaging: Portable 1 view chest radiograph with no active disease.  CT abdomen/pelvis without contrast showing acute pancreatitis and worsening surrounding inflammatory change and small volume free fluid.  Unchanged punctate nephrolithiasis that is nonobstructing in the left kidney.   ED course: Initial vital signs were temperature 98.4 F, pulse 70, respirations 21, BP 202/98 mmHg.  The patient received morphine 4 mg IVP, ondansetron 4 mg IVP aspirin 324 mg p.o., amlodipine 10 mg p.o. x 1.  Review  of Systems: As mentioned in the history of present illness. All other systems reviewed and are negative. Past Medical History:  Diagnosis Date   Anemia    Arthritis    OCC LEG PAIN   Bladder cancer (HCC) TCC   History of bladder carcinoma; S/P BCG TX'S ; "never treated for cancer; it was a spot on my bladder" (07/20/2018)   CKD (chronic kidney disease), stage III (HCC)    History of gout    "on daily RX" (07/20/2018)   History of kidney stones    Hyperlipidemia    Hypertension    Impaired hearing BILATERAL    OCCASIONAL WEARS HEARING AIDS   Mobitz I    [name] 07/20/2018   Pneumonia  ~ 1980s X 1   Presence of permanent cardiac pacemaker 07/20/2018   Right bundle branch block    Spondylolisthesis    Type II diabetes mellitus (HCC)    Past Surgical History:  Procedure Laterality Date   COLONOSCOPY  03/2004   CYSTO / RESECTIONAL BLADDER BX'S  05-01-2011   CYSTOSCOPY WITH BIOPSY  10/05/2011   Procedure: CYSTOSCOPY WITH BIOPSY;  Surgeon: Garnett Farm, MD;  Location: [facility];  Service: Urology;  Laterality: N/A;  BLADDER BIOPSY    CYSTOSCOPY WITH LITHOLAPAXY     EXCISIONAL HEMORRHOIDECTOMY  1980s   INSERT / REPLACE / REMOVE PACEMAKER  07/20/2018   Implantation of new dual chamber permanent pacemaker   PACEMAKER IMPLANT N/A 07/20/2018   Procedure: PACEMAKER IMPLANT;  Surgeon: Thurmon Fair, MD;  Location: [facility];  Service: Cardiovascular;  Laterality: N/A;   REFRACTIVE SURGERY Bilateral    TRANSURETHRAL RESECTION OF  BLADDER TUMOR  03-02-2011   US ECHOCARDIOGRAPHY  07-11-2009   EF 55-60%   Social History:  reports that he has quit smoking. His smoking use included cigars. He has never used smokeless tobacco. He reports that he does not currently use alcohol. He reports that he does not use drugs.  Allergies  Allergen Reactions   Fosinopril Cough   Fenofibrate Rash    Family History  Problem Relation Age of Onset   Colon cancer Brother    Cancer  Brother    Cancer Brother    Diabetes Sister     Prior to Admission medications   Medication Sig Start Date End Date Taking? Authorizing Provider  acetaminophen (TYLENOL) 500 MG tablet Take 1,000 mg by mouth every 6 (six) hours as needed for mild pain.    [provider]  allopurinol (ZYLOPRIM) 100 MG tablet Take 100 mg by mouth daily. 07/07/18   [provider]  amLODipine (NORVASC) 10 MG tablet Take 10 mg by mouth daily.  12/09/15   [provider]  aspirin 81 MG tablet Take 81 mg by mouth daily.    [provider]  dicyclomine (BENTYL) 20 MG tablet Take 1 tablet (20 mg total) by mouth 2 (two) times daily for 14 days. 06/29/23 07/13/23  Maxwell Marion, PA-C  HUMULIN 70/30 (70-30) 100 UNIT/ML injection Inject 15-40 Units into the skin 2 (two) times daily with a meal. Inject 40 units with breakfast and 15 units with supper. 12/24/15   [provider]  minocycline (MINOCIN,DYNACIN) 100 MG capsule Take 100 mg by mouth daily.  02/11/13   [provider]  oxyCODONE 10 MG TABS Take 1 tablet (10 mg total) by mouth every 3 (three) hours as needed for severe pain ((score 7 to 10)). 08/16/18   Donalee Citrin, MD  rosuvastatin (CRESTOR) 40 MG tablet Take 40 mg by mouth daily.    [provider]    Physical Exam: Vitals:   07/19/23 1011 07/19/23 1115 07/19/23 1130 07/19/23 1215  BP:  (!) 156/92 (!) 154/92 (!) 175/93  Pulse: 70 77 71 71  Resp: 15 16 (!) 22 13  Temp:  98.4 F (36.9 C) 98.4 F (36.9 C) 98.4 F (36.9 C)  TempSrc:  Oral    SpO2: 100% 97% 98% 100%  Weight: 79.4 kg     Height: 5\' 11"  (1.803 m)      Physical Exam Vitals reviewed.  HENT:     Head: Normocephalic.     Nose: Nasal deformity present. No rhinorrhea.     Comments: Severe rosacea changes on his nasal right side.    Mouth/Throat:     Mouth: Mucous membranes are moist.  Eyes:     General: No scleral icterus.    Pupils: Pupils are equal, round, and reactive to light.   Neck:     Vascular: No JVD.  Cardiovascular:     Rate and Rhythm: Normal rate and regular rhythm.  Pulmonary:     Effort: Pulmonary effort is normal.     Breath sounds: Normal breath sounds.  Abdominal:     General: Bowel sounds are normal. There is no distension.     Palpations: Abdomen is soft.     Tenderness: There is abdominal tenderness. There is no right CVA tenderness, left CVA tenderness or guarding.  Musculoskeletal:     Cervical back: Neck supple.     Right lower leg: No edema.     Left lower leg: No edema.  Skin:  General: Skin is warm and dry.  Neurological:     General: No focal deficit present.     Mental Status: He is alert and oriented to person, place, and time.  Psychiatric:        Mood and Affect: Mood normal.        Behavior: Behavior normal.     Data Reviewed:  Results are pending, will review when available.  Assessment and Plan: Principal Problem:   Acute pancreatitis Inpatient/telemetry. 0.45% NaCl IV hydration. Clear liquid diet. Analgesics as needed. Antiemetics as needed. Pantoprazole 40 mg IVP daily. Follow CBC, CMP and lipase in AM.  Active Problems:   Essential hypertension Received amlodipine 10 mg in the ED. Continue amlodipine 5 mg p.o. daily. -Increase to 10 mg if no results. -Labetalol 10 mg every 2 hours as needed order. -Optimize pain control as possible.    Hyperlipidemia Off rosuvastatin.    Type 2 diabetes mellitus (HCC) Clear liquid diet diet. CBG monitoring with RI SS. Check hemoglobin A1c.    CKD (chronic kidney disease), stage IV (HCC) Monitor renal function electrolytes.    Anemia in chronic renal disease Monitor hematocrit and hemoglobin.    Mild protein malnutrition (HCC) In the setting of anemia, pancreatitis and renal disease. May eventually benefit from protein supplementation. -Will defer for now. Follow-up albumin level.    Rosacea Might benefit from topical metronidazole. Needs to see  dermatology as an outpatient.    Advance Care Planning:   Code Status: Full Code   Consults:   Family Communication:   Severity of Illness: The appropriate patient status for this patient is INPATIENT. Inpatient status is judged to be reasonable and necessary in order to provide the required intensity of service to ensure the patient's safety. The patient's presenting symptoms, physical exam findings, and initial radiographic and laboratory data in the context of their chronic comorbidities is felt to place them at high risk for further clinical deterioration. Furthermore, it is not anticipated that the patient will be medically stable for discharge from the hospital within 2 midnights of admission.   * I certify that at the point of admission it is my clinical judgment that the patient will require inpatient hospital care spanning beyond 2 midnights from the point of admission due to high intensity of service, high risk for further deterioration and high frequency of surveillance required.*  Author: Bobette Mo, MD 07/19/2023 2:20 PM  For on call review [email or web address].   This document was prepared using Dragon voice recognition software and may contain some unintended transcription errors.

## 2023-07-20 DIAGNOSIS — K859 Acute pancreatitis without necrosis or infection, unspecified: Secondary | ICD-10-CM | POA: Diagnosis not present

## 2023-07-20 DIAGNOSIS — K8500 Idiopathic acute pancreatitis without necrosis or infection: Secondary | ICD-10-CM | POA: Diagnosis not present

## 2023-07-20 LAB — GLUCOSE, CAPILLARY
Glucose-Capillary: 216 mg/dL — ABNORMAL HIGH (ref 70–99)
Glucose-Capillary: 219 mg/dL — ABNORMAL HIGH (ref 70–99)
Glucose-Capillary: 241 mg/dL — ABNORMAL HIGH (ref 70–99)
Glucose-Capillary: 270 mg/dL — ABNORMAL HIGH (ref 70–99)
Glucose-Capillary: 310 mg/dL — ABNORMAL HIGH (ref 70–99)

## 2023-07-20 LAB — CBC
HCT: 37.8 % — ABNORMAL LOW (ref 39.0–52.0)
Hemoglobin: 11.8 g/dL — ABNORMAL LOW (ref 13.0–17.0)
MCH: 28 pg (ref 26.0–34.0)
MCHC: 31.2 g/dL (ref 30.0–36.0)
MCV: 89.6 fL (ref 80.0–100.0)
Platelets: 241 10*3/uL (ref 150–400)
RBC: 4.22 MIL/uL (ref 4.22–5.81)
RDW: 16 % — ABNORMAL HIGH (ref 11.5–15.5)
WBC: 9.6 10*3/uL (ref 4.0–10.5)
nRBC: 0 % (ref 0.0–0.2)

## 2023-07-20 LAB — COMPREHENSIVE METABOLIC PANEL
ALT: 13 U/L (ref 0–44)
AST: 13 U/L — ABNORMAL LOW (ref 15–41)
Albumin: 3 g/dL — ABNORMAL LOW (ref 3.5–5.0)
Alkaline Phosphatase: 71 U/L (ref 38–126)
Anion gap: 8 (ref 5–15)
BUN: 32 mg/dL — ABNORMAL HIGH (ref 8–23)
CO2: 20 mmol/L — ABNORMAL LOW (ref 22–32)
Calcium: 9.9 mg/dL (ref 8.9–10.3)
Chloride: 108 mmol/L (ref 98–111)
Creatinine, Ser: 2.15 mg/dL — ABNORMAL HIGH (ref 0.61–1.24)
GFR, Estimated: 29 mL/min — ABNORMAL LOW (ref >60)
Glucose, Bld: 276 mg/dL — ABNORMAL HIGH (ref 70–99)
Potassium: 5.4 mmol/L — ABNORMAL HIGH (ref 3.5–5.1)
Sodium: 136 mmol/L (ref 135–145)
Total Bilirubin: 0.7 mg/dL (ref 0.3–1.2)
Total Protein: 6.2 g/dL — ABNORMAL LOW (ref 6.5–8.1)

## 2023-07-20 LAB — LIPASE, BLOOD: Lipase: 604 U/L — ABNORMAL HIGH (ref 11–51)

## 2023-07-20 LAB — TRIGLYCERIDES: Triglycerides: 271 mg/dL — ABNORMAL HIGH (ref <150)

## 2023-07-20 MED ORDER — SODIUM ZIRCONIUM CYCLOSILICATE 10 G PO PACK
10.0000 g | PACK | Freq: Once | ORAL | Status: AC
  Administered 2023-07-20: 10 g via ORAL
  Filled 2023-07-20: qty 1

## 2023-07-20 MED ORDER — ASPIRIN 81 MG PO TBEC
81.0000 mg | DELAYED_RELEASE_TABLET | Freq: Every day | ORAL | Status: DC
  Administered 2023-07-20 – 2023-07-21 (×2): 81 mg via ORAL
  Filled 2023-07-20 (×2): qty 1

## 2023-07-20 MED ORDER — SODIUM BICARBONATE 650 MG PO TABS
1300.0000 mg | ORAL_TABLET | Freq: Two times a day (BID) | ORAL | Status: DC
  Administered 2023-07-20 – 2023-07-21 (×3): 1300 mg via ORAL
  Filled 2023-07-20 (×3): qty 2

## 2023-07-20 MED ORDER — INSULIN ASPART 100 UNIT/ML IJ SOLN
0.0000 [IU] | INTRAMUSCULAR | Status: DC
  Administered 2023-07-20 (×2): 3 [IU] via SUBCUTANEOUS
  Administered 2023-07-20: 7 [IU] via SUBCUTANEOUS
  Administered 2023-07-20: 5 [IU] via SUBCUTANEOUS
  Administered 2023-07-20: 3 [IU] via SUBCUTANEOUS
  Administered 2023-07-21: 1 [IU] via SUBCUTANEOUS
  Administered 2023-07-21: 5 [IU] via SUBCUTANEOUS

## 2023-07-20 MED ORDER — ALLOPURINOL 100 MG PO TABS
100.0000 mg | ORAL_TABLET | Freq: Every day | ORAL | Status: DC
  Administered 2023-07-20 – 2023-07-21 (×2): 100 mg via ORAL
  Filled 2023-07-20 (×2): qty 1

## 2023-07-20 NOTE — Consult Note (Addendum)
Consultation  Primary Care Physician:  Rodrigo Ran, MD Primary Gastroenterologist:  Dr. Russella Dar       Reason for Consultation:  Acute pancreatitis DOA: 07/19/2023         Hospital Day: 2         HPI:   Keith Drake is a 88 y.o. male with past medical history significant for type II DM, HTN, HLD, stage IIIb CKD, and s/p PPM, seen in ED on 9/3 for abdominal pain. He was diagnosed with acute pancreatitis based on elevated lipase and CT abdomen, discharged home with conservative management, returned to ED on 07/19/2023 with complaints of chest/abdominal pain with nausea.   Work up in ED notable for elevated BP 202/98, better after pain management and sublingual NTG. After- BP 175/93. Labs significant; bicarbonate 17, glucose 279, BUN 36, creatinine 2.29, lipase up from 396 on 9/3 to 583.  High-sensitivity troponin was 37 then 28.  EKG shows biventricular paced rhythm and BBB morphology.  Hemoglobin 11.4.S/p aspirin 324 Mg x 1 2, Fentanyl IV 50 mcg x 1, IV morphine 4 mg x 1, IV Zofran 4 mg x 1 and sublingual NTG x 3 doses. Chest x-ray without acute findings. CT [place] from 9/3 showed peripancreatic inflammation consistent with acute pancreatitis but no collections. Repeat CT abdomen ordered.  Patient sitting in recliner having clear liquids for breakfast. He reports on 9/3 wa shis first bout of pancreatitis. His symptoms returned this past Friday where he developed pain behind his ribcage that radiated down into his abdomen with one episode of nausea. He reports he had poor oral intake over the course of the last 3 days due to his symptoms. No fever, chills or recent illness. He has chronic issues with constipation with intermittent loose stools. Last BM was two days ago. No hx of autoimmune. Family history of cancer in one sister, unknown type. Last TRG noted from March was 364. Reports he has had 2 beers over the course of the past year. Smokes a cigar socially, but has been a few years. No new  OTC herbal supplements. Only new medication as of recent is Repatha which per patient he has not taken yet. No antibiotic therapy recently.  No family was present at the time of my evaluation.   Previous GI workup:   07/19/2023 CT ABDOMEN AND PELVIS WITHOUT CONTRAST   IMPRESSION: 1. Acute pancreatitis with worsened surrounding inflammatory change and small volume free fluid since the study from 06/29/2023. No organized or drainable fluid collection. Note that necrosis can not be excluded in the absence of intravenous contrast. 2. Unchanged punctate nonobstructing left lower pole renal stone.  06/29/2023  CT ABDOMEN AND PELVIS WITHOUT CONTRAST  IMPRESSION: 1. Peripancreatic infiltration consistent with acute pancreatitis. No loculated collections identified. 2. Nonobstructing stones in the kidneys. 3. Aortic atherosclerosis.   01/23/2016 Colonoscopy- screening with Dr. Russella Dar Impression:  - Two 7 to 8 mm polyps in the descending colon and in the ascending colon, removed with a cold snare. Resected and retrieved.  - Internal hemorrhoids.  Final diagnosis -tubular adenomas. -No high-grade dysplasia or malignancy identified  04/16/2004 Colonoscopy with Dr. Russella Dar -two sessile polyps and internal hemorrhoids  Abnormal ED labs:  Abnormal Labs Reviewed  CBC - Abnormal; Notable for the following components:      Result Value   RBC 4.12 (*)    Hemoglobin 11.4 (*)    HCT 35.8 (*)    RDW 16.1 (*)    All other  components within normal limits  COMPREHENSIVE METABOLIC PANEL - Abnormal; Notable for the following components:   CO2 17 (*)    Glucose, Bld 279 (*)    BUN 36 (*)    Creatinine, Ser 2.29 (*)    Albumin 3.4 (*)    GFR, Estimated 27 (*)    All other components within normal limits  LIPASE, BLOOD - Abnormal; Notable for the following components:   Lipase 583 (*)    All other components within normal limits  HEMOGLOBIN A1C - Abnormal; Notable for the following components:    Hgb A1c MFr Bld 9.1 (*)    All other components within normal limits  COMPREHENSIVE METABOLIC PANEL - Abnormal; Notable for the following components:   Potassium 5.4 (*)    CO2 20 (*)    Glucose, Bld 276 (*)    BUN 32 (*)    Creatinine, Ser 2.15 (*)    Total Protein 6.2 (*)    Albumin 3.0 (*)    AST 13 (*)    GFR, Estimated 29 (*)    All other components within normal limits  CBC - Abnormal; Notable for the following components:   Hemoglobin 11.8 (*)    HCT 37.8 (*)    RDW 16.0 (*)    All other components within normal limits  LIPASE, BLOOD - Abnormal; Notable for the following components:   Lipase 604 (*)    All other components within normal limits  GLUCOSE, CAPILLARY - Abnormal; Notable for the following components:   Glucose-Capillary 302 (*)    All other components within normal limits  GLUCOSE, CAPILLARY - Abnormal; Notable for the following components:   Glucose-Capillary 270 (*)    All other components within normal limits  CBG MONITORING, ED - Abnormal; Notable for the following components:   Glucose-Capillary 264 (*)    All other components within normal limits  TROPONIN I (HIGH SENSITIVITY) - Abnormal; Notable for the following components:   Troponin I (High Sensitivity) 37 (*)    All other components within normal limits  TROPONIN I (HIGH SENSITIVITY) - Abnormal; Notable for the following components:   Troponin I (High Sensitivity) 28 (*)    All other components within normal limits    Past Medical History:  Diagnosis Date   Anemia    Arthritis    OCC LEG PAIN   Bladder cancer (HCC) TCC   History of bladder carcinoma; S/P BCG TX'S ; "never treated for cancer; it was a spot on my bladder" (07/20/2018)   CKD (chronic kidney disease), stage III (HCC)    History of gout    "on daily RX" (07/20/2018)   History of kidney stones    Hyperlipidemia    Hypertension    Impaired hearing BILATERAL    OCCASIONAL WEARS HEARING AIDS   [place] I    [name] 07/20/2018    Pneumonia  ~ 1980s X 1   Presence of permanent cardiac pacemaker 07/20/2018   Right bundle branch block    Spondylolisthesis    Type II diabetes mellitus (HCC)     Surgical History:  He  has a past surgical history that includes CYSTO / RESECTIONAL BLADDER BX'S (05-01-2011); Transurethral resection of bladder tumor (03-02-2011); Cystoscopy with biopsy (10/05/2011); US ECHOCARDIOGRAPHY (07-11-2009); Colonoscopy (03/2004); Insert / replace / remove pacemaker (07/20/2018); Excisional hemorrhoidectomy (1980s); Cystoscopy with litholapaxy; Refractive surgery (Bilateral); and PACEMAKER IMPLANT (N/A, 07/20/2018). Family History:  His family history includes Cancer in his brother and brother; Colon cancer in his brother;  Diabetes in his sister. Social History:   reports that he has quit smoking. His smoking use included cigars. He has never used smokeless tobacco. He reports that he does not currently use alcohol. He reports that he does not use drugs.  Prior to Admission medications   Medication Sig Start Date End Date Taking? Authorizing Provider  acetaminophen (TYLENOL) 500 MG tablet Take 1,000 mg by mouth every 6 (six) hours as needed for mild pain.   Yes [provider]  allopurinol (ZYLOPRIM) 100 MG tablet Take 100 mg by mouth daily. 07/07/18  Yes [provider]  amLODipine (NORVASC) 10 MG tablet Take 10 mg by mouth daily.  12/09/15  Yes [provider]  aspirin 81 MG tablet Take 81 mg by mouth daily.   Yes [provider]  dicyclomine (BENTYL) 20 MG tablet Take 1 tablet (20 mg total) by mouth 2 (two) times daily for 14 days. 06/29/23 07/19/23 Yes Maxwell Marion, PA-C  HUMULIN 70/30 (70-30) 100 UNIT/ML injection Inject 15-40 Units into the skin daily. Inject 40 units with breakfast and 15 units with supper. 12/24/15  Yes [provider]  sodium bicarbonate 650 MG tablet Take 1,300 mg by mouth 2 (two) times daily. 06/03/23  Yes [provider]     Current Facility-Administered Medications  Medication Dose Route Frequency Provider Last Rate Last Admin   0.45 % sodium chloride infusion   Intravenous Continuous Bobette Mo, MD 100 mL/hr at 07/19/23 2121 New Bag at 07/19/23 2121   acetaminophen (TYLENOL) tablet 650 mg  650 mg Oral Q6H PRN Bobette Mo, MD       Or   acetaminophen (TYLENOL) suppository 650 mg  650 mg Rectal Q6H PRN Bobette Mo, MD       allopurinol (ZYLOPRIM) tablet 100 mg  100 mg Oral Daily Kc, Dayna Barker, MD       amLODipine (NORVASC) tablet 5 mg  5 mg Oral Daily Bobette Mo, MD       aspirin EC tablet 81 mg  81 mg Oral Daily Kc, Ramesh, MD       HYDROmorphone (DILAUDID) injection 0.5 mg  0.5 mg Intravenous Q2H PRN Bobette Mo, MD       insulin aspart (novoLOG) injection 0-9 Units  0-9 Units Subcutaneous Q4H Kc, Ramesh, MD       labetalol (NORMODYNE) injection 10 mg  10 mg Intravenous Q2H PRN Bobette Mo, MD       nitroGLYCERIN (NITROSTAT) SL tablet 0.4 mg  0.4 mg Sublingual Q5 min PRN Arthor Captain, PA-C   0.4 mg at 07/19/23 1050   ondansetron (ZOFRAN) tablet 4 mg  4 mg Oral Q6H PRN Bobette Mo, MD       Or   ondansetron [facility]) injection 4 mg  4 mg Intravenous Q6H PRN Bobette Mo, MD       pantoprazole (PROTONIX) injection 40 mg  40 mg Intravenous Daily Bobette Mo, MD   40 mg at 07/19/23 1751   sodium bicarbonate tablet 1,300 mg  1,300 mg Oral BID Kc, Dayna Barker, MD       sodium zirconium cyclosilicate (LOKELMA) packet 10 g  10 g Oral Once Lanae Boast, MD        Allergies as of 07/19/2023 - Review Complete 07/19/2023  Allergen Reaction Noted   Fosinopril Cough 11/04/2020   Fenofibrate Rash 11/04/2020    Review of Systems:    Constitutional: No weight loss, fever, chills, weakness or fatigue HEENT:  Eyes: No change in vision               Ears, Nose, Throat:  No change in hearing or congestion Skin: No rash or itching Cardiovascular: No chest  pain, chest pressure or palpitations   Respiratory: No SOB or cough Gastrointestinal: See HPI and otherwise negative Genitourinary: No dysuria or change in urinary frequency Neurological: No headache, dizziness or syncope Musculoskeletal: No new muscle or joint pain Hematologic: No bleeding or bruising Psychiatric: No history of depression or anxiety     Physical Exam:  Vital signs in last 24 hours: Temp:  [97.9 F (36.6 C)-98.9 F (37.2 C)] 98.9 F (37.2 C) (09/24 0609) Pulse Rate:  [70-77] 71 (09/24 0611) Resp:  [13-22] 18 (09/24 0609) BP: (154-202)/(84-98) 192/85 (09/24 0611) SpO2:  [97 %-100 %] 99 % (09/24 0609) Weight:  [79.4 kg] 79.4 kg (09/23 1011) Last BM Date : 07/20/23 Last BM recorded by nurses in past 5 days Stool Type: Type 6 (Mushy consistency with ragged edges) (07/20/2023  6:28 AM)  General:   Pleasant, well developed male in no acute distress Head:  Normocephalic and atraumatic. Eyes: sclerae anicteric,conjunctive pink  Ears: hard of hearing, requires hearing aids Heart:  regular rate and rhythm, no murmurs or gallops Pulm: Clear anteriorly; no wheezing Abdomen:  Distended, Obese AB, Hypoactive bowel sounds. mild tenderness in the epigastrium. Without guarding and Without rebound, No organomegaly appreciated. Extremities:  Without edema. Msk:  Symmetrical without gross deformities. Peripheral pulses intact.  Neurologic:  Alert and  oriented x4;  No focal deficits.  Skin:   Dry and intact without significant lesions or rashes. Psychiatric:  Cooperative. Normal mood and affect.  LAB RESULTS: Recent Labs    07/19/23 1033 07/20/23 0456  WBC 8.9 9.6  HGB 11.4* 11.8*  HCT 35.8* 37.8*  PLT 270 241   BMET Recent Labs    07/19/23 1033 07/20/23 0456  NA 137 136  K 4.8 5.4*  CL 108 108  CO2 17* 20*  GLUCOSE 279* 276*  BUN 36* 32*  CREATININE 2.29* 2.15*  CALCIUM 10.1 9.9   LFT Recent Labs    07/20/23 0456  PROT 6.2*  ALBUMIN 3.0*  AST 13*  ALT  13  ALKPHOS 71  BILITOT 0.7   PT/INR No results for input(s): "LABPROT", "INR" in the last 72 hours.  STUDIES: CT ABDOMEN PELVIS WO CONTRAST  Result Date: 07/19/2023 CLINICAL DATA:  Abdominal and chest pain EXAM: CT ABDOMEN AND PELVIS WITHOUT CONTRAST TECHNIQUE: Multidetector CT imaging of the abdomen and pelvis was performed following the standard protocol without IV contrast. RADIATION DOSE REDUCTION: This exam was performed according to the departmental dose-optimization program which includes automated exposure control, adjustment of the mA and/or kV according to patient size and/or use of iterative reconstruction technique. COMPARISON:  CT abdomen/pelvis 06/29/2023 FINDINGS: Lower chest: The lung bases are clear. Cardiac device leads are noted. Hepatobiliary: Scattered small hypodense lesions in the liver are too small to characterize but likely reflect benign cysts requiring no specific imaging follow-up. The gallbladder is unremarkable. There is no biliary ductal dilatation. Pancreas: Peripancreatic inflammatory fat stranding and small volume free fluid has progressed since the prior study from 06/29/2023. There is no main pancreatic ductal dilatation. There is no organized peripancreatic fluid collection. Spleen: Unremarkable. Adrenals/Urinary Tract: The adrenals are unremarkable. Hypodense lesions in both kidneys likely reflect benign cysts requiring no specific imaging follow-up. There is no suspicious lesion, within the confines of noncontrast technique. There is a punctate  nonobstructing left lower pole renal stone. There is no hydronephrosis or hydroureter. The bladder is unremarkable. Stomach/Bowel: The stomach is unremarkable. There is no evidence of bowel obstruction. There is no abnormal bowel wall thickening or inflammatory change. The appendix is not definitively identified. Vascular/Lymphatic: There is calcified plaque in the nonaneurysmal abdominal aorta. There is no abdominal or pelvic  lymphadenopathy. Reproductive: The prostate and seminal vesicles are unremarkable. Other: There is trace free fluid in the pelvis. There is no free intraperitoneal air. Musculoskeletal: Postsurgical changes reflecting L4-L5 posterior instrumented fusion noted with adjacent segment disease at L3-L4 and L5-S1. There is no perihardware lucency or other evidence of hardware related complication. There is no acute osseous abnormality or suspicious osseous lesion. IMPRESSION: 1. Acute pancreatitis with worsened surrounding inflammatory change and small volume free fluid since the study from 06/29/2023. No organized or drainable fluid collection. Note that necrosis can not be excluded in the absence of intravenous contrast. 2. Unchanged punctate nonobstructing left lower pole renal stone. Electronically Signed   By: Lesia Hausen M.D.   On: 07/19/2023 14:15   DG Chest Port 1 View  Result Date: 07/19/2023 CLINICAL DATA:  Chest pain. EXAM: PORTABLE CHEST 1 VIEW COMPARISON:  Mar 10, 2021. FINDINGS: The heart size and mediastinal contours are within normal limits. Left-sided pacemaker is unchanged in position. Both lungs are clear. The visualized skeletal structures are unremarkable. IMPRESSION: No active disease. Electronically Signed   By: Lupita Raider M.D.   On: 07/19/2023 12:02      Impression /Plan:  88 year old male patient who had presented to ED on 9/3 for abdominal pain and diagnosed with acute pancreatitis with CT scan and elevated lipase of 396. PT was discharged home on bentyl and zofran.  Pt returned to ED yesterday with complaints of chest/abdominal pain with nausea.  Upon admission his lipase is up from 396 to 583.  Repeat CT scan shows worsening inflammatory change small volume free fluid. Afebrile. TRG from (12/2022) was 364. No new medications or abx. No alcohol or tobacco as of recent. Corrected CA was 10.7. No other risk factors identified. Weight loss noted over past few weeks. Lipase today is  604. Normal LFTs exception of total protein at 6.2. WBC 9.6. Supportive Care management:  -IV fluids -Clear liquid diet -antiemetics and pain management prn per hospitalist -check TRG - check PTH given hx of elevated CA level -Consider output MRI   Anemia secondary to chronic renal disease.  Hemoglobin today is 11.8.  Baseline is 12-13.  Type 2 diabetes. Glucose 200's. HA1c 9.1. -SSI  Kidney disease stage IV. BUN 32 and creatinine 2.15  Hyperkalemia, potassium today is 5.4 -Continue to monitor  Other comorbidities: hyperlipidemia on pravastatin, hypertension on amlodipine and prn  Principal Problem:   Acute pancreatitis Active Problems:   Essential hypertension   Hyperlipidemia   Type 2 diabetes mellitus (HCC)   CKD (chronic kidney disease), stage IV (HCC)   Anemia in chronic renal disease   Mild protein malnutrition (HCC)   Rosacea    LOS: 1 day   Thank you for your kind consultation, we will continue to follow.  Deanna J May  07/20/2023, 8:54 AM    [facility] Attending   I have taken an interval history, reviewed the chart and examined the patient. I agree with the Advanced Practitioner's note, impression and recommendations with the following additions:  Cause of pancreatitis not clear from available information. We will check PTH to see if he has hyperparathyroidism -  may have some component secondary given CKD, so doubt primary cause but possible.  In general would wait on MR of pancreas (malignancy of pancreas possible cause) until inflammation subsides and that is our current plan.  I am advancing diet - abd non-tender - try full liquids  Iva Boop, MD, [facility] See Loretha Stapler on call - gastroenterology for best contact person 07/20/2023 4:31 PM

## 2023-07-20 NOTE — Evaluation (Signed)
Physical Therapy Evaluation Patient Details Name: Keith Drake MRN: [id number] DOB: Dec 02, 1934 Today's Date: 07/20/2023  History of Present Illness  88 yo male presents to therapy following hospital admission on 07/19/2023 due to abdominal and chest pain, nausea and dizziness. Pt was found to have acute pancreatitis with worsening surrounding inflammatory chages and small volume free fluid on abdominal/pelvic CT. CXR no active disease. Pt has PMH including but not limited to anemia, bladder ca, CKD IV, kidney stones, HLD, HTN, HOH, pacemaker, RBBB, DM II, Mobitz 1 heart block, OA and spondylolisthesis.  Clinical Impression      Pt admitted with above diagnosis.  Pt currently with functional limitations due to the deficits listed below (see PT Problem List). Pt seated in recliner when PT arrived. Pt indicated no pain, SOB or dizziness initially. Pt reported back pain with mobility, SOB with prolonged gait tasks and dizziness with cervical extension. Pt may benefit from further assessment for vertigo. Pt required min A for sit to stand  from recliner with cues for safety and proper technique, pt required CGA and cues for safety with gait tasks and assessed in hallway with personal cane 150 feet. PT strongly recommends use of RW for safety and stability with pt exhibiting lateral sway L and R, wide BOS with shuffling pattern and reaching for objects or handrails in hallway. Pt will benefit from acute skilled PT to increase their independence and safety with mobility to allow discharge.       If plan is discharge home, recommend the following: A little help with walking and/or transfers;A little help with bathing/dressing/bathroom;Assistance with cooking/housework;Assist for transportation;Help with stairs or ramp for entrance   Can travel by private vehicle        Equipment Recommendations Rolling walker (2 wheels)  Recommendations for Other Services       Functional Status Assessment Patient has  had a recent decline in their functional status and demonstrates the ability to make significant improvements in function in a reasonable and predictable amount of time.     Precautions / Restrictions Precautions Precautions: Fall Restrictions Weight Bearing Restrictions: No      Mobility  Bed Mobility               General bed mobility comments: pt seated in recliner when PT arrived    Transfers Overall transfer level: Needs assistance Equipment used: Straight cane Transfers: Sit to/from Stand Sit to Stand: Min assist           General transfer comment: min A and cues for safety, proper UE and body placement and attention to IV    Ambulation/Gait Ambulation/Gait assistance: Contact guard assist Gait Distance (Feet): 150 Feet Assistive device: Straight cane (personal) Gait Pattern/deviations: Step-to pattern, Shuffle, Staggering left, Staggering right, Narrow base of support, Trunk flexed Gait velocity: decreased     General Gait Details: gait assessed with personal SPC-- recommend use of RW for safety and stability. pt reaching for handrail in hallway, noted instability to L and R with short stride lengh and lateral sway  Stairs            Wheelchair Mobility     Tilt Bed    Modified Rankin (Stroke Patients Only)       Balance Overall balance assessment: Needs assistance, History of Falls Sitting-balance support: Feet supported Sitting balance-[name]: Fair     Standing balance support: Single extremity supported, During functional activity, Reliant on assistive device for balance Standing balance-[name]: Poor  Pertinent Vitals/Pain Pain Assessment Pain Assessment: Faces Faces Pain Scale: Hurts a little bit Pain Location: back Pain Descriptors / Indicators: Aching, Discomfort, Nagging Pain Intervention(s): Limited activity within patient's tolerance, Monitored during session    Home  Living Family/patient expects to be discharged to:: Private residence Living Arrangements: Alone Available Help at Discharge: Family;Available PRN/intermittently Type of Home: House Home Access: Stairs to enter Entrance Stairs-Rails: Lawyer of Steps: 2   Home Layout: One level Home Equipment: Rollator (4 wheels);Cane - single point;Shower seat      Prior Function Prior Level of Function : Independent/Modified Independent;History of Falls (last six months)             Mobility Comments: pt reports mod I at [facility] level for household mobility with need to reach funiture for stability when using cane, mod I for ADLs, self care tasks       Extremity/Trunk Assessment        Lower Extremity Assessment Lower Extremity Assessment: Generalized weakness (B knee valgus deformity and mild distal B LE edema noted)    Cervical / Trunk Assessment Cervical / Trunk Assessment: Back Surgery  Communication   Communication Communication: Hearing impairment (has B hearing aids)  Cognition Arousal: Alert Behavior During Therapy: WFL for tasks assessed/performed Overall Cognitive Status: Within Functional Limits for tasks assessed                                          General Comments      Exercises     Assessment/Plan    PT Assessment Patient needs continued PT services  PT Problem List Decreased activity tolerance;Decreased balance;Decreased mobility;Decreased coordination;Pain       PT Treatment Interventions DME instruction;Gait training;Stair training;Functional mobility training;Therapeutic activities;Therapeutic exercise;Balance training;Neuromuscular re-education;Patient/family education    PT Goals (Current goals can be found in the Care Plan section)  Acute Rehab PT Goals Patient Stated Goal: to be able to go home and get rid of this dizziness PT Goal Formulation: With patient Time For Goal Achievement: 08/03/23 Potential  to Achieve Goals: Good    Frequency Min 1X/week     Co-evaluation               AM-PAC PT "6 Clicks" Mobility  Outcome Measure Help needed turning from your back to your side while in a flat bed without using bedrails?: A Little Help needed moving from lying on your back to sitting on the side of a flat bed without using bedrails?: A Little Help needed moving to and from a bed to a chair (including a wheelchair)?: A Little Help needed standing up from a chair using your arms (e.g., wheelchair or bedside chair)?: A Little Help needed to walk in hospital room?: A Little Help needed climbing 3-5 steps with a railing? : A Lot 6 Click Score: 17    End of Session Equipment Utilized During Treatment: Gait belt Activity Tolerance: Patient tolerated treatment well;Other (comment) (reports of SOB and O2 saturation on RA 96% with elevated HR of 96) Patient left: in chair;with call bell/phone within reach Nurse Communication: Mobility status;Other (comment) (son requesting nurse call him) PT Visit Diagnosis: Unsteadiness on feet (R26.81);Other abnormalities of gait and mobility (R26.89);Muscle weakness (generalized) (M62.81);History of falling (Z91.81);Pain;Difficulty in walking, not elsewhere classified (R26.2)    Time: 0865-7846 PT Time Calculation (min) (ACUTE ONLY): 24 min   Charges:   PT Evaluation $  PT Eval Low Complexity: 1 Low PT Treatments $Gait Training: 8-22 mins PT General Charges $$ ACUTE PT VISIT: 1 Visit         Johnny Bridge, PT Acute Rehab]   Jacqualyn Posey 07/20/2023, 1:11 PM

## 2023-07-20 NOTE — Hospital Course (Addendum)
88 yom w/ anemia, bladder cancer, osteoarthritis, stage IV CKD, nephrolithiasis, hyperlipidemia, hypertension, RBBB, Mobitz 1 heart block, pacemaker placement, spondylolisthesis, type 2 diabetes presented  w/ abdominal/chest pain, nausea and dizziness since he woke up from sleep on 07/19/23. He had recent episodes of pancreatitis and vertigo. He took some meclizine to relieve his symptoms.  ED course: Uncontrolled hypertension in 200s afebrile not hypoxic.  Labs shows glucose 276 creatinine 2.2 lipase 583 normal AST ALT and bilirubin, troponin 37> 28 CBC with hemoglobin 9.4.  A1c 9.1  CXR> normal. CT abdomen/pelvis without contrast>>acute pancreatitis and worsening surrounding inflammatory change and small volume free fluid.  Unchanged punctate nephrolithiasis that is nonobstructing in the left kidney. Patient received morphine 4 mg IVP, ondansetron 4 mg IVP aspirin 324 mg p.o., amlodipine 10 mg p.o. x 1 and admitted for further management to [facility].

## 2023-07-20 NOTE — Plan of Care (Signed)
?  Problem: Elimination: ?Goal: Will not experience complications related to urinary retention ?Outcome: Progressing ?  ?

## 2023-07-20 NOTE — Progress Notes (Addendum)
PROGRESS NOTE Keith Drake  WUJ:[id number] DOB: [birth year] DOA: 07/19/2023 PCP: Rodrigo Ran, MD  Brief Narrative/Hospital Course: 53 yom w/ anemia, bladder cancer, osteoarthritis, stage IV CKD, nephrolithiasis, hyperlipidemia, hypertension, RBBB, Mobitz 1 heart block, pacemaker placement, spondylolisthesis, type 2 diabetes presented  w/ abdominal/chest pain, nausea and dizziness since he woke up from sleep on 07/19/23. He had recent episodes of pancreatitis and vertigo. He took some meclizine to relieve his symptoms.  ED course: Uncontrolled hypertension in 200s afebrile not hypoxic.  Labs shows glucose 276 creatinine 2.2 lipase 583 normal AST ALT and bilirubin, troponin 37> 28 CBC with hemoglobin 9.4.  A1c 9.1  CXR> normal. CT abdomen/pelvis without contrast>>acute pancreatitis and worsening surrounding inflammatory change and small volume free fluid.  Unchanged punctate nephrolithiasis that is nonobstructing in the left kidney. Patient received morphine 4 mg IVP, ondansetron 4 mg IVP aspirin 324 mg p.o., amlodipine 10 mg p.o. x 1 and admitted for further management to [facility].    Subjective: Patient seen and examined this morning.  He reports he feels much improved this morning no nausea vomiting or abdominal pain  Assessment and Plan: Principal Problem:   Acute pancreatitis Active Problems:   Essential hypertension   Hyperlipidemia   Type 2 diabetes mellitus (HCC)   CKD (chronic kidney disease), stage IV (HCC)   Anemia in chronic renal disease   Mild protein malnutrition (HCC)   Rosacea   Acute pancreatitis, failed outpatient treatment: CT abdomen finding as above AST ALT T. bili normal. ?  Etiology.  Patient denies any alcohol use, checking triglycerides?  MRI deferred to GI.  Continue on clear liquid diet,conservative management aggressive acute hydration antiemetics analgesics PPI clear liquid diet and advance as tolerated. GI eval.  Hypertension poorly controlled: Continue home  amlodipine 10 mg, continue labetalol iv prn, will add additional scheduled agent as needed.  Type 2 diabetes mellitus: A1c at night, keep on SSI q4hr. He is on mix insulin 40u and15 u holding Recent Labs  Lab 07/19/23 1044 07/19/23 1706 07/19/23 2136 07/20/23 0741  GLUCAP 264*  --  302* 270*  HGBA1C  --  9.1*  --   --     CKD stage IV Hyperkalemia potassium 5.4 Metabolic acidosis bicarb 20: given Lokelma, keep IV fluid hydration, monitor electrolytes Recent Labs    06/29/23 1410 07/19/23 1033 07/20/23 0456  BUN 35* 36* 32*  CREATININE 2.35* 2.29* 2.15*  CO2 18* 17* 20*  K 4.7 4.8 5.4*    Anemia of chronic kidney disease: monitor hb. Recent Labs    06/29/23 1410 07/19/23 1033 07/20/23 0456  HGB 13.0 11.4* 11.8*  MCV 85.3 86.9 89.6    Atypical chest pain: Likely from abdominal pain with pancreatitis.  Resume mild troponin but no delta suspect demand ischemia and ckd. Resume home meds  Mild protein malnutrition RD eval  Rosacea: Follow-up with dermatology outpatient  DVT prophylaxis: SCDs Start: 07/19/23 1422 Code Status:   Code Status: Full Code Family Communication: plan of care discussed with patient at bedside. Patient status is:  inpatient  because of acute pancreatitis Level of care: Telemetry   Dispo: The patient is from: home lives alone            Anticipated disposition: TBD  Objective: Vitals last 24 hrs: Vitals:   07/20/23 0153 07/20/23 0609 07/20/23 0611 07/20/23 0931  BP: (!) 177/87 (!) 192/96 (!) 192/85 (!) 166/78  Pulse: 77 76 71 75  Resp: 16 18  16   Temp: 98.9 F (37.2 C)  98.9 F (37.2 C)  98.7 F (37.1 C)  TempSrc: Oral Oral  Oral  SpO2: 100% 99%  100%  Weight:      Height:       Weight change:   Physical Examination: General exam: alert awake, older than stated age HEENT:Oral mucosa moist, Ear/Nose WNL grossly Respiratory system: bilaterally clear BS, no use of accessory muscle Cardiovascular system: S1 & S2 +, No  JVD. Gastrointestinal system: Abdomen soft,NT,ND, BS+ Nervous System:Alert, awake, moving extremities. Extremities: LE edema neg,distal peripheral pulses palpable.  Skin: No rashes,no icterus. MSK: Normal muscle bulk,tone, power  Medications reviewed:  Scheduled Meds:  allopurinol  100 mg Oral Daily   amLODipine  5 mg Oral Daily   aspirin EC  81 mg Oral Daily   insulin aspart  0-9 Units Subcutaneous Q4H   pantoprazole (PROTONIX) IV  40 mg Intravenous Daily   sodium bicarbonate  1,300 mg Oral BID   Continuous Infusions:  sodium chloride 100 mL/hr at 07/19/23 2121      Diet Order             Diet clear liquid Fluid consistency: Thin  Diet effective now                  Intake/Output Summary (Last 24 hours) at 07/20/2023 1142 Last data filed at 07/20/2023 1000 Gross per 24 hour  Intake 2135.26 ml  Output 1050 ml  Net 1085.26 ml   Net IO Since Admission: 1,085.26 mL [07/20/23 1142]  Wt Readings from Last 3 Encounters:  07/19/23 79.4 kg  06/29/23 79.4 kg  12/21/22 89 kg     Unresulted Labs (From admission, onward)     Start     Ordered   07/21/23 0500  Basic metabolic panel  Daily,   R      07/20/23 0845   07/21/23 0500  CBC  Daily,   R      07/20/23 0845   07/21/23 0500  PTH, intact and calcium  Tomorrow morning,   R        07/20/23 1142   07/20/23 1140  Glucose, capillary  Once,   R        07/20/23 1140   07/20/23 0940  Triglycerides  Once,   R        07/20/23 0939   07/20/23 0500  Lipase, blood  Daily,   R      07/19/23 1842          Data Reviewed: I have personally reviewed following labs and imaging studies CBC: Recent Labs  Lab 07/19/23 1033 07/20/23 0456  WBC 8.9 9.6  HGB 11.4* 11.8*  HCT 35.8* 37.8*  MCV 86.9 89.6  PLT 270 241   Basic Metabolic Panel: Recent Labs  Lab 07/19/23 1033 07/20/23 0456  NA 137 136  K 4.8 5.4*  CL 108 108  CO2 17* 20*  GLUCOSE 279* 276*  BUN 36* 32*  CREATININE 2.29* 2.15*  CALCIUM 10.1 9.9    GFR: Estimated Creatinine Clearance: 26.3 mL/min (A) (by C-G formula based on SCr of 2.15 mg/dL (H)). Liver Function Tests: Recent Labs  Lab 07/19/23 1033 07/20/23 0456  AST 16 13*  ALT 16 13  ALKPHOS 78 71  BILITOT 0.6 0.7  PROT 6.6 6.2*  ALBUMIN 3.4* 3.0*   Recent Labs  Lab 07/19/23 1033 07/20/23 0456  LIPASE 583* 604*  HbA1C: Recent Labs    07/19/23 1706  HGBA1C 9.1*   CBG: Recent Labs  Lab 07/19/23  1044 07/19/23 2136 07/20/23 0741  GLUCAP 264* 302* 270*  No results found for this or any previous visit (from the past 240 hour(s)).  Antimicrobials: Anti-infectives (From admission, onward)    None      Culture/Microbiology No results found for: "SDES", "SPECREQUEST", "CULT", "REPTSTATUS"  Radiology Studies: CT ABDOMEN PELVIS WO CONTRAST  Result Date: 07/19/2023 CLINICAL DATA:  Abdominal and chest pain EXAM: CT ABDOMEN AND PELVIS WITHOUT CONTRAST TECHNIQUE: Multidetector CT imaging of the abdomen and pelvis was performed following the standard protocol without IV contrast. RADIATION DOSE REDUCTION: This exam was performed according to the departmental dose-optimization program which includes automated exposure control, adjustment of the mA and/or kV according to patient size and/or use of iterative reconstruction technique. COMPARISON:  CT abdomen/pelvis 06/29/2023 FINDINGS: Lower chest: The lung bases are clear. Cardiac device leads are noted. Hepatobiliary: Scattered small hypodense lesions in the liver are too small to characterize but likely reflect benign cysts requiring no specific imaging follow-up. The gallbladder is unremarkable. There is no biliary ductal dilatation. Pancreas: Peripancreatic inflammatory fat stranding and small volume free fluid has progressed since the prior study from 06/29/2023. There is no main pancreatic ductal dilatation. There is no organized peripancreatic fluid collection. Spleen: Unremarkable. Adrenals/Urinary Tract: The adrenals are  unremarkable. Hypodense lesions in both kidneys likely reflect benign cysts requiring no specific imaging follow-up. There is no suspicious lesion, within the confines of noncontrast technique. There is a punctate nonobstructing left lower pole renal stone. There is no hydronephrosis or hydroureter. The bladder is unremarkable. Stomach/Bowel: The stomach is unremarkable. There is no evidence of bowel obstruction. There is no abnormal bowel wall thickening or inflammatory change. The appendix is not definitively identified. Vascular/Lymphatic: There is calcified plaque in the nonaneurysmal abdominal aorta. There is no abdominal or pelvic lymphadenopathy. Reproductive: The prostate and seminal vesicles are unremarkable. Other: There is trace free fluid in the pelvis. There is no free intraperitoneal air. Musculoskeletal: Postsurgical changes reflecting L4-L5 posterior instrumented fusion noted with adjacent segment disease at L3-L4 and L5-S1. There is no perihardware lucency or other evidence of hardware related complication. There is no acute osseous abnormality or suspicious osseous lesion. IMPRESSION: 1. Acute pancreatitis with worsened surrounding inflammatory change and small volume free fluid since the study from 06/29/2023. No organized or drainable fluid collection. Note that necrosis can not be excluded in the absence of intravenous contrast. 2. Unchanged punctate nonobstructing left lower pole renal stone. Electronically Signed   By: Lesia Hausen M.D.   On: 07/19/2023 14:15   DG Chest Port 1 View  Result Date: 07/19/2023 CLINICAL DATA:  Chest pain. EXAM: PORTABLE CHEST 1 VIEW COMPARISON:  Mar 10, 2021. FINDINGS: The heart size and mediastinal contours are within normal limits. Left-sided pacemaker is unchanged in position. Both lungs are clear. The visualized skeletal structures are unremarkable. IMPRESSION: No active disease. Electronically Signed   By: Lupita Raider M.D.   On: 07/19/2023 12:02      LOS: 1 day   Lanae Boast, MD [facility]  07/20/2023, 11:42 AM

## 2023-07-20 NOTE — Evaluation (Signed)
Occupational Therapy Evaluation Patient Details Name: Keith Drake MRN: [id number] DOB: 10-02-1935 Today's Date: 07/20/2023   History of Present Illness 88 yo male presents to the hospital on 07/19/2023 due to abdominal and chest pain, nausea and dizziness. Pt was found to have acute pancreatitis with worsening surrounding inflammatory chages and small volume free fluid on abdominal/pelvic CT. CXR no active disease. Pt has PMH including but not limited to anemia, bladder ca, CKD IV, kidney stones, HLD, HTN, HOH, pacemaker, RBBB, DM II, Mobitz 1 heart block, OA and spondylolisthesis.   Clinical Impression   Patient is a 88 year old who was admitted for above. Patient was living at home alone prior level. Currently, patient has dizziness impacting participation in ADLs. Patient was mod A for bed mobility with increased dizziness with transition. Patient declined to try standing with reports of having large LOB earlier. Patient was noted to have decreased functional activity tolerance, decreased endurance, decreased standing balance, decreased safety awareness, and decreased knowledge of AD/AE impacting participation in ADLs. Patient would continue to benefit from skilled OT services at this time while admitted and after d/c to address noted deficits in order to improve overall safety and independence in ADLs.        If plan is discharge home, recommend the following: A lot of help with bathing/dressing/bathroom;Assistance with cooking/housework;Direct supervision/assist for medications management;Assist for transportation;Help with stairs or ramp for entrance;Direct supervision/assist for financial management;A lot of help with walking and/or transfers    Functional Status Assessment  Patient has not had a recent decline in their functional status  Equipment Recommendations  None recommended by OT       Precautions / Restrictions Precautions Precautions: Fall Restrictions Weight Bearing  Restrictions: No      Mobility Bed Mobility Overal bed mobility: Needs Assistance Bed Mobility: Supine to Sit     Supine to sit: Mod assist     General bed mobility comments: with increased time and cues for proper hand movement.               Balance Overall balance assessment: Needs assistance, History of Falls Sitting-balance support: Feet supported Sitting balance-[name]: Fair           ADL either performed or assessed with clinical judgement   ADL Overall ADL's : Needs assistance/impaired Eating/Feeding: Supervision/ safety;Sitting   Grooming: Bed level;Minimal assistance   Upper Body Bathing: Sitting;Minimal assistance   Lower Body Bathing: Sitting/lateral leans;Maximal assistance   Upper Body Dressing : Minimal assistance;Sitting   Lower Body Dressing: Maximal assistance;Sitting/lateral leans     Toilet Transfer Details (indicate cue type and reason): patient was mod A for sit on EOB. patient unable to tolerate standing attempt with dizziness. Toileting- Clothing Manipulation and Hygiene: Maximal assistance;Bed level               Vision Patient Visual Report: Nausea/blurring vision with head movement Additional Comments: patient reporting dizziness with sitting EOB, no nystagmus observed.            Pertinent Vitals/Pain Pain Assessment Pain Assessment: Faces Faces Pain Scale: Hurts a little bit Pain Location: back Pain Descriptors / Indicators: Aching, Discomfort, Nagging Pain Intervention(s): Limited activity within patient's tolerance, Monitored during session     Extremity/Trunk Assessment Upper Extremity Assessment Upper Extremity Assessment: Overall WFL for tasks assessed;LUE deficits/detail LUE Deficits / Details: patient reported pain in L shoulder with reports of recent fall into truck. patient still able to ROM over 110 FF. patient reported pain happens when  attempting to push through. patient able to [facility] ER.   Lower  Extremity Assessment Lower Extremity Assessment: Defer to PT evaluation   Cervical / Trunk Assessment Cervical / Trunk Assessment: Back Surgery   Communication Communication Communication: Hearing impairment (bialteral hearing aids)   Cognition Arousal: Alert Behavior During Therapy: WFL for tasks assessed/performed Overall Cognitive Status: Within Functional Limits for tasks assessed           General Comments: patient reported today was tuesday september 23rd                Home Living Family/patient expects to be discharged to:: Private residence Living Arrangements: Alone Available Help at Discharge: Family;Available PRN/intermittently Type of Home: House Home Access: Stairs to enter [employer] of Steps: 2 Entrance Stairs-Rails: Left;Right Home Layout: One level     Bathroom Shower/Tub: Chief Strategy Officer: Standard     Home Equipment: Rollator (4 wheels);Cane - single point;Shower seat          Prior Functioning/Environment Prior Level of Function : Independent/Modified Independent;History of Falls (last six months)             Mobility Comments: pt reports mod I at [facility] level for household mobility with need to reach funiture for stability when using cane. ADLs Comments: mod I for ADLs, self care tasks        OT Problem List: Decreased activity tolerance;Impaired balance (sitting and/or standing);Decreased coordination;Decreased safety awareness;Decreased knowledge of precautions;Decreased knowledge of use of DME or AE;Cardiopulmonary status limiting activity      OT Treatment/Interventions: Self-care/ADL training;Therapeutic exercise;Patient/family education;Balance training;Therapeutic activities    OT Goals(Current goals can be found in the care plan section) Acute Rehab OT Goals OT Goal Formulation: With patient Time For Goal Achievement: 08/03/23 Potential to Achieve Goals: Fair  OT Frequency: Min 1X/week        AM-PAC OT "6 Clicks" Daily Activity     Outcome Measure Help from another person eating meals?: A Little Help from another person taking care of personal grooming?: A Little Help from another person toileting, which includes using toliet, bedpan, or urinal?: A Lot Help from another person bathing (including washing, rinsing, drying)?: A Lot Help from another person to put on and taking off regular upper body clothing?: A Little Help from another person to put on and taking off regular lower body clothing?: A Lot 6 Click Score: 15   End of Session Nurse Communication: Mobility status  Activity Tolerance: Other (comment) (dizziness) Patient left: in bed;with call bell/phone within reach;with bed alarm set  OT Visit Diagnosis: Unsteadiness on feet (R26.81);Other abnormalities of gait and mobility (R26.89)                Time: 1352-1411 OT Time Calculation (min): 19 min Charges:  OT General Charges $OT Visit: 1 Visit OT Evaluation $OT Eval Low Complexity: 1 Low  Blondell Laperle OTR/L, MS Acute Rehabilitation Department Office# [phone]   Selinda Flavin 07/20/2023, 2:29 PM

## 2023-07-21 ENCOUNTER — Inpatient Hospital Stay: Payer: Medicare Other | Admitting: Hematology and Oncology

## 2023-07-21 ENCOUNTER — Inpatient Hospital Stay: Payer: Medicare Other

## 2023-07-21 DIAGNOSIS — E441 Mild protein-calorie malnutrition: Secondary | ICD-10-CM

## 2023-07-21 DIAGNOSIS — D631 Anemia in chronic kidney disease: Secondary | ICD-10-CM

## 2023-07-21 DIAGNOSIS — N184 Chronic kidney disease, stage 4 (severe): Secondary | ICD-10-CM | POA: Diagnosis not present

## 2023-07-21 DIAGNOSIS — K859 Acute pancreatitis without necrosis or infection, unspecified: Secondary | ICD-10-CM | POA: Diagnosis not present

## 2023-07-21 DIAGNOSIS — I10 Essential (primary) hypertension: Secondary | ICD-10-CM | POA: Diagnosis not present

## 2023-07-21 LAB — GLUCOSE, CAPILLARY
Glucose-Capillary: 115 mg/dL — ABNORMAL HIGH (ref 70–99)
Glucose-Capillary: 131 mg/dL — ABNORMAL HIGH (ref 70–99)
Glucose-Capillary: 258 mg/dL — ABNORMAL HIGH (ref 70–99)

## 2023-07-21 LAB — BASIC METABOLIC PANEL
Anion gap: 5 (ref 5–15)
BUN: 25 mg/dL — ABNORMAL HIGH (ref 8–23)
CO2: 20 mmol/L — ABNORMAL LOW (ref 22–32)
Calcium: 9.6 mg/dL (ref 8.9–10.3)
Chloride: 107 mmol/L (ref 98–111)
Creatinine, Ser: 2.03 mg/dL — ABNORMAL HIGH (ref 0.61–1.24)
GFR, Estimated: 31 mL/min — ABNORMAL LOW (ref >60)
Glucose, Bld: 119 mg/dL — ABNORMAL HIGH (ref 70–99)
Potassium: 4.1 mmol/L (ref 3.5–5.1)
Sodium: 132 mmol/L — ABNORMAL LOW (ref 135–145)

## 2023-07-21 LAB — RETICULOCYTES
Immature Retic Fract: 6.3 % (ref 2.3–15.9)
RBC.: 3.62 MIL/uL — ABNORMAL LOW (ref 4.22–5.81)
Retic Count, Absolute: 41.3 10*3/uL (ref 19.0–186.0)
Retic Ct Pct: 1.1 % (ref 0.4–3.1)

## 2023-07-21 LAB — IRON AND TIBC
Iron: 22 ug/dL — ABNORMAL LOW (ref 45–182)
Saturation Ratios: 10 % — ABNORMAL LOW (ref 17.9–39.5)
TIBC: 214 ug/dL — ABNORMAL LOW (ref 250–450)
UIBC: 192 ug/dL

## 2023-07-21 LAB — CBC
HCT: 32.5 % — ABNORMAL LOW (ref 39.0–52.0)
Hemoglobin: 10 g/dL — ABNORMAL LOW (ref 13.0–17.0)
MCH: 27.9 pg (ref 26.0–34.0)
MCHC: 30.8 g/dL (ref 30.0–36.0)
MCV: 90.5 fL (ref 80.0–100.0)
Platelets: 143 10*3/uL — ABNORMAL LOW (ref 150–400)
RBC: 3.59 MIL/uL — ABNORMAL LOW (ref 4.22–5.81)
RDW: 15.8 % — ABNORMAL HIGH (ref 11.5–15.5)
WBC: 9.2 10*3/uL (ref 4.0–10.5)
nRBC: 0 % (ref 0.0–0.2)

## 2023-07-21 LAB — FERRITIN: Ferritin: 220 ng/mL (ref 24–336)

## 2023-07-21 LAB — LIPASE, BLOOD: Lipase: 155 U/L — ABNORMAL HIGH (ref 11–51)

## 2023-07-21 LAB — VITAMIN B12: Vitamin B-12: 262 pg/mL (ref 180–914)

## 2023-07-21 LAB — FOLATE: Folate: 4.6 ng/mL — ABNORMAL LOW (ref >5.9)

## 2023-07-21 MED ORDER — ESOMEPRAZOLE MAGNESIUM 20 MG PO CPDR: 20.0000 mg | DELAYED_RELEASE_CAPSULE | Freq: Every day | ORAL | 1 refills | Status: AC

## 2023-07-21 MED ORDER — VITAMIN B-12 1000 MCG PO TABS
1000.0000 ug | ORAL_TABLET | Freq: Every day | ORAL | Status: DC
  Administered 2023-07-21: 1000 ug via ORAL
  Filled 2023-07-21: qty 1

## 2023-07-21 MED ORDER — ONDANSETRON HCL 4 MG PO TABS: 4.0000 mg | ORAL_TABLET | Freq: Four times a day (QID) | ORAL | 0 refills | Status: DC | PRN

## 2023-07-21 MED ORDER — FE FUM-VIT C-VIT B12-FA 460-60-0.01-1 MG PO CAPS
1.0000 | ORAL_CAPSULE | Freq: Two times a day (BID) | ORAL | Status: DC
  Administered 2023-07-21: 1 via ORAL
  Filled 2023-07-21: qty 1

## 2023-07-21 MED ORDER — CYANOCOBALAMIN 1000 MCG PO TABS: 1000.0000 ug | ORAL_TABLET | Freq: Every day | ORAL | 1 refills | Status: AC

## 2023-07-21 MED ORDER — FE FUM-VIT C-VIT B12-FA 460-60-0.01-1 MG PO CAPS: 1.0000 | ORAL_CAPSULE | Freq: Two times a day (BID) | ORAL | 2 refills | Status: DC

## 2023-07-21 NOTE — Plan of Care (Deleted)

## 2023-07-21 NOTE — Plan of Care (Signed)
Problem: Education: Goal: Knowledge of General Education information will improve Description: Including pain rating scale, medication(s)/side effects and non-pharmacologic comfort measures 07/21/2023 1207 by Amil Amen, RN Outcome: Adequate for Discharge 07/21/2023 1142 by Amil Amen, RN Outcome: Adequate for Discharge   Problem: Health Behavior/Discharge Planning: Goal: Ability to manage health-related needs will improve 07/21/2023 1207 by Amil Amen, RN Outcome: Adequate for Discharge 07/21/2023 1142 by Amil Amen, RN Outcome: Adequate for Discharge   Problem: Clinical Measurements: Goal: Ability to maintain clinical measurements within normal limits will improve 07/21/2023 1207 by Amil Amen, RN Outcome: Adequate for Discharge 07/21/2023 1142 by Amil Amen, RN Outcome: Adequate for Discharge Goal: Will remain free from infection 07/21/2023 1207 by Amil Amen, RN Outcome: Adequate for Discharge 07/21/2023 1142 by Amil Amen, RN Outcome: Adequate for Discharge Goal: Diagnostic test results will improve 07/21/2023 1207 by Amil Amen, RN Outcome: Adequate for Discharge 07/21/2023 1142 by Amil Amen, RN Outcome: Adequate for Discharge Goal: Respiratory complications will improve 07/21/2023 1207 by Amil Amen, RN Outcome: Adequate for Discharge 07/21/2023 1142 by Amil Amen, RN Outcome: Adequate for Discharge Goal: Cardiovascular complication will be avoided 07/21/2023 1207 by Amil Amen, RN Outcome: Adequate for Discharge 07/21/2023 1142 by Amil Amen, RN Outcome: Adequate for Discharge   Problem: Activity: Goal: Risk for activity intolerance will decrease 07/21/2023 1207 by Amil Amen, RN Outcome: Adequate for Discharge 07/21/2023 1142 by Amil Amen, RN Outcome: Adequate for Discharge   Problem: Nutrition: Goal: Adequate nutrition will be maintained 07/21/2023 1207 by Amil Amen, RN Outcome: Adequate for Discharge 07/21/2023 1142 by Amil Amen,  RN Outcome: Adequate for Discharge   Problem: Coping: Goal: Level of anxiety will decrease 07/21/2023 1207 by Amil Amen, RN Outcome: Adequate for Discharge 07/21/2023 1142 by Amil Amen, RN Outcome: Adequate for Discharge   Problem: Elimination: Goal: Will not experience complications related to bowel motility 07/21/2023 1207 by Amil Amen, RN Outcome: Adequate for Discharge 07/21/2023 1142 by Amil Amen, RN Outcome: Adequate for Discharge Goal: Will not experience complications related to urinary retention 07/21/2023 1207 by Amil Amen, RN Outcome: Adequate for Discharge 07/21/2023 1142 by Amil Amen, RN Outcome: Adequate for Discharge   Problem: Pain Managment: Goal: General experience of comfort will improve 07/21/2023 1207 by Amil Amen, RN Outcome: Adequate for Discharge 07/21/2023 1142 by Amil Amen, RN Outcome: Adequate for Discharge   Problem: Safety: Goal: Ability to remain free from injury will improve 07/21/2023 1207 by Amil Amen, RN Outcome: Adequate for Discharge 07/21/2023 1142 by Amil Amen, RN Outcome: Adequate for Discharge   Problem: Skin Integrity: Goal: Risk for impaired skin integrity will decrease 07/21/2023 1207 by Amil Amen, RN Outcome: Adequate for Discharge 07/21/2023 1142 by Amil Amen, RN Outcome: Adequate for Discharge   Problem: Education: Goal: Ability to describe self-care measures that may prevent or decrease complications (Diabetes Survival Skills Education) will improve 07/21/2023 1207 by Amil Amen, RN Outcome: Adequate for Discharge 07/21/2023 1142 by Amil Amen, RN Outcome: Adequate for Discharge Goal: Individualized Educational Video(s) 07/21/2023 1207 by Amil Amen, RN Outcome: Adequate for Discharge 07/21/2023 1142 by Amil Amen, RN Outcome: Adequate for Discharge   Problem: Coping: Goal: Ability to adjust to condition or change in health will improve 07/21/2023 1207 by Amil Amen, RN Outcome:  Adequate for Discharge 07/21/2023 1142 by Amil Amen, RN Outcome: Adequate for Discharge   Problem: Fluid Volume: Goal: Ability to maintain a balanced intake and output will improve 07/21/2023 1207 by Amil Amen, RN Outcome: Adequate for Discharge 07/21/2023 1142 by Amil Amen,  RN Outcome: Adequate for Discharge   Problem: Health Behavior/Discharge Planning: Goal: Ability to identify and utilize available resources and services will improve 07/21/2023 1207 by Amil Amen, RN Outcome: Adequate for Discharge 07/21/2023 1142 by Amil Amen, RN Outcome: Adequate for Discharge Goal: Ability to manage health-related needs will improve 07/21/2023 1207 by Amil Amen, RN Outcome: Adequate for Discharge 07/21/2023 1142 by Amil Amen, RN Outcome: Adequate for Discharge   Problem: Metabolic: Goal: Ability to maintain appropriate glucose levels will improve 07/21/2023 1207 by Amil Amen, RN Outcome: Adequate for Discharge 07/21/2023 1142 by Amil Amen, RN Outcome: Adequate for Discharge   Problem: Nutritional: Goal: Maintenance of adequate nutrition will improve 07/21/2023 1207 by Amil Amen, RN Outcome: Adequate for Discharge 07/21/2023 1142 by Amil Amen, RN Outcome: Adequate for Discharge Goal: Progress toward achieving an optimal weight will improve 07/21/2023 1207 by Amil Amen, RN Outcome: Adequate for Discharge 07/21/2023 1142 by Amil Amen, RN Outcome: Adequate for Discharge   Problem: Skin Integrity: Goal: Risk for impaired skin integrity will decrease 07/21/2023 1207 by Amil Amen, RN Outcome: Adequate for Discharge 07/21/2023 1142 by Amil Amen, RN Outcome: Adequate for Discharge   Problem: Tissue Perfusion: Goal: Adequacy of tissue perfusion will improve 07/21/2023 1207 by Amil Amen, RN Outcome: Adequate for Discharge 07/21/2023 1142 by Amil Amen, RN Outcome: Adequate for Discharge

## 2023-07-21 NOTE — Progress Notes (Signed)
AVS reviewed w/ patient who verbalized an understanding. Pt 's PIV removed by primary nurse. [facility] has been set up. Pt's niece will be picking pt up today. All belongings placed in pt belonging bag- pt's underwear and pajama bottoms wet - placed in another bag and tied up - this RN offered mesh underwear to pt who declined- pt earing jeans and pink shirt home. Pt's slippers, hearing aids w/ charger, charging cord placed in bag. Pt's yellow watch placed on left wrist by  pt. Pt's cell phone left out for pt use. AVS placed in belonging bag.

## 2023-07-21 NOTE — TOC Transition Note (Signed)
Transition of Care [facility]) - CM/SW Discharge Note  Patient Details  Name: Dejan Carnevale MRN: [id number] Date of Birth: January 14, 1935  Transition of Care [facility]) CM/SW Contact:  Ewing Schlein, LCSW Phone Number: 07/21/2023, 12:31 PM  Clinical Narrative: Patient requested that CSW speak with his son, Helton Bonser, regarding [facility] recommendation. CSW spoke with son regarding [facility] referral. Son agreeable to [facility] being set up and confirmed patient already has a rolling walker at home. CSW made [facility] referral to Cindie with [facility], which was accepted. CSW updated son and Charity fundraiser. HH orders placed by hospitalist. TOC signing off.  Final next level of care: Home w Home Health Services Barriers to Discharge: No Barriers Identified  Patient Goals and CMS Choice CMS Medicare.gov Compare Post Acute Care list provided to:: Patient Represenative (must comment) Choice offered to / list presented to : Adult Children  Discharge Plan and Services Additional resources added to the After Visit Summary for           DME Arranged: N/A DME Agency: NA HH Arranged: PT, OT, Nurse's Aide, RN HH Agency: [facility] Contact[facility]: 07/21/23 Time HH Agency Contacted: 1209 Representative spoke with at [facility] Agency: Cindie  Social Determinants of Health (SDOH) Interventions SDOH Screenings   Tobacco Use: Medium Risk (07/19/2023)   Readmission Risk Interventions     No data to display

## 2023-07-21 NOTE — Progress Notes (Addendum)
Progress Note  Primary GI: Dr. Russella Dar  LOS: 2 days   Chief Complaint:acute pancreatitis   Subjective  Patient lying in bed having full liquids for breakfast.  Patient states pain has improved today.  No reports of nausea or vomiting.  Has not had bowel movement today.  Asking about going home later this evening.  Biggest complaint  today is of vertigo.   Objective   Vital signs in last 24 hours: Temp:  [97.8 F (36.6 C)-98.7 F (37.1 C)] 98.6 F (37 C) (09/25 0450) Pulse Rate:  [71-77] 71 (09/25 0450) Resp:  [16-18] 18 (09/25 0450) BP: (145-166)/(78-89) 145/89 (09/25 0450) SpO2:  [100 %] 100 % (09/25 0450) Last BM Date : 07/20/23 Last BM recorded by nurses in past 5 days Stool Type: Type 6 (Mushy consistency with ragged edges) (07/20/2023  6:28 AM)  General:   male in no acute distress  Heart:  Regular rate and rhythm; no murmurs Pulm: Clear anteriorly; no wheezing Abdomen: soft, nondistended, hypoactive bowel sounds in all quadrants. Nontender without guarding. No organomegaly appreciated. Extremities:  trace edema Neurologic:  Alert and  oriented x4; Hard of hearing Psych:  Cooperative. Normal mood and affect.  Intake/Output from previous day: 09/24 0701 - 09/25 0700 In: 1706.4 [P.O.:840; I.V.:866.4] Out: 1000 [Urine:1000]  Studies/Results: CT ABDOMEN PELVIS WO CONTRAST  Result Date: 07/19/2023 CLINICAL DATA:  Abdominal and chest pain EXAM: CT ABDOMEN AND PELVIS WITHOUT CONTRAST TECHNIQUE: Multidetector CT imaging of the abdomen and pelvis was performed following the standard protocol without IV contrast. RADIATION DOSE REDUCTION: This exam was performed according to the departmental dose-optimization program which includes automated exposure control, adjustment of the mA and/or kV according to patient size and/or use of iterative reconstruction technique. COMPARISON:  CT abdomen/pelvis 06/29/2023 FINDINGS: Lower chest: The lung bases are clear. Cardiac device leads are  noted. Hepatobiliary: Scattered small hypodense lesions in the liver are too small to characterize but likely reflect benign cysts requiring no specific imaging follow-up. The gallbladder is unremarkable. There is no biliary ductal dilatation. Pancreas: Peripancreatic inflammatory fat stranding and small volume free fluid has progressed since the prior study from 06/29/2023. There is no main pancreatic ductal dilatation. There is no organized peripancreatic fluid collection. Spleen: Unremarkable. Adrenals/Urinary Tract: The adrenals are unremarkable. Hypodense lesions in both kidneys likely reflect benign cysts requiring no specific imaging follow-up. There is no suspicious lesion, within the confines of noncontrast technique. There is a punctate nonobstructing left lower pole renal stone. There is no hydronephrosis or hydroureter. The bladder is unremarkable. Stomach/Bowel: The stomach is unremarkable. There is no evidence of bowel obstruction. There is no abnormal bowel wall thickening or inflammatory change. The appendix is not definitively identified. Vascular/Lymphatic: There is calcified plaque in the nonaneurysmal abdominal aorta. There is no abdominal or pelvic lymphadenopathy. Reproductive: The prostate and seminal vesicles are unremarkable. Other: There is trace free fluid in the pelvis. There is no free intraperitoneal air. Musculoskeletal: Postsurgical changes reflecting L4-L5 posterior instrumented fusion noted with adjacent segment disease at L3-L4 and L5-S1. There is no perihardware lucency or other evidence of hardware related complication. There is no acute osseous abnormality or suspicious osseous lesion. IMPRESSION: 1. Acute pancreatitis with worsened surrounding inflammatory change and small volume free fluid since the study from 06/29/2023. No organized or drainable fluid collection. Note that necrosis can not be excluded in the absence of intravenous contrast. 2. Unchanged punctate  nonobstructing left lower pole renal stone. Electronically Signed   By: Lesia Hausen  M.D.   On: 07/19/2023 14:15   DG Chest Port 1 View  Result Date: 07/19/2023 CLINICAL DATA:  Chest pain. EXAM: PORTABLE CHEST 1 VIEW COMPARISON:  Mar 10, 2021. FINDINGS: The heart size and mediastinal contours are within normal limits. Left-sided pacemaker is unchanged in position. Both lungs are clear. The visualized skeletal structures are unremarkable. IMPRESSION: No active disease. Electronically Signed   By: Lupita Raider M.D.   On: 07/19/2023 12:02    Lab Results: Recent Labs    07/19/23 1033 07/20/23 0456 07/21/23 0442  WBC 8.9 9.6 9.2  HGB 11.4* 11.8* 10.0*  HCT 35.8* 37.8* 32.5*  PLT 270 241 143*   BMET Recent Labs    07/19/23 1033 07/20/23 0456 07/21/23 0442  NA 137 136 132*  K 4.8 5.4* 4.1  CL 108 108 107  CO2 17* 20* 20*  GLUCOSE 279* 276* 119*  BUN 36* 32* 25*  CREATININE 2.29* 2.15* 2.03*  CALCIUM 10.1 9.9 9.6   LFT Recent Labs    07/20/23 0456  PROT 6.2*  ALBUMIN 3.0*  AST 13*  ALT 13  ALKPHOS 71  BILITOT 0.7   PT/INR No results for input(s): "LABPROT", "INR" in the last 72 hours.   Scheduled Meds:  allopurinol  100 mg Oral Daily   amLODipine  5 mg Oral Daily   aspirin EC  81 mg Oral Daily   insulin aspart  0-9 Units Subcutaneous Q4H   pantoprazole (PROTONIX) IV  40 mg Intravenous Daily   sodium bicarbonate  1,300 mg Oral BID   Continuous Infusions:  sodium chloride 100 mL/hr at 07/20/23 2000     Patient Narrative:  Keith Drake is a 88 y.o. male with past medical history significant for type II DM, HTN, HLD, stage IIIb CKD, and s/p PPM, seen in ED on 9/3 for abdominal pain. He was diagnosed with acute pancreatitis based on elevated lipase and CT abdomen, discharged home with conservative management, returned to ED on 07/19/2023 with complaints of chest/abdominal pain with nausea.          Impression /Plan:  88 year old male patient who had presented  to ED on 9/3 for abdominal pain and diagnosed with acute pancreatitis on CT and elevated lipase- 396. PT was discharged home on bentyl and zofran.  Pt returned to ED 9/23 with complaints of chest/abdominal pain with nausea.  Upon admission lipase is up from 396 to 583.  Repeat CT shows worsening inflammatory change small volume free fluid. Afebrile. TRG 271. No new medications or abx. No alcohol or tobacco. Corrected CA was 10.7. No other risk factors identified. Weight loss noted over past few weeks.  Lipase 155. Normal LFTs.  Supportive Care management:  -IV fluids -Advance to soft low fat diet -antiemetics and pain management prn per hospitalist -pending PTH  to see if he has hyperparathyroidism   Anemia secondary to chronic renal disease.  Hemoglobin today is 10.Yesterday was 11.8.  Baseline is 12-13. Plts today 143?   Type 2 diabetes. Glucose 200's. HA1c 9.1. -SSI   Kidney disease stage IV. BUN 25 and creatinine 2.03   Hyperkalemia, potassium today is 4.1 -Continue to monitor   Other comorbidities: hyperlipidemia on pravastatin, hypertension on amlodipine and prn       Principal Problem:   Acute pancreatitis Active Problems:   Essential hypertension   Hyperlipidemia   Type 2 diabetes mellitus (HCC)   CKD (chronic kidney disease), stage IV (HCC)   Anemia in chronic renal disease   Mild  protein malnutrition (HCC)   Rosacea   Hypercalcemia   Deanna J May  07/21/2023, 8:36 AM  Agree w/ NP evaluation and plans. Patient was dc today. Will have outpatient f/u and repeat imaging after he recovers.  Iva Boop, MD, [facility] See Loretha Stapler on call - gastroenterology for best contact person 07/21/2023 4:37 PM

## 2023-07-21 NOTE — Discharge Summary (Signed)
Physician Discharge Summary   Patient: Keith Drake MRN: [id number] DOB: Mar 04, 1935  Admit date:     07/19/2023  Discharge date: 07/21/23  Discharge Physician: Arnetha Courser   PCP: Rodrigo Ran, MD   Recommendations at discharge:  Please obtain CBC and CMP on follow-up Please consider starting on EPO for anemia of renal disease. Follow-up with primary care provider within a week Follow-up with GI  Discharge Diagnoses: Principal Problem:   Acute pancreatitis Active Problems:   Essential hypertension   Hyperlipidemia   Type 2 diabetes mellitus (HCC)   CKD (chronic kidney disease), stage IV (HCC)   Anemia in chronic renal disease   Mild protein malnutrition (HCC)   Rosacea   Hypercalcemia   Hospital Course: 65 yom w/ anemia, bladder cancer, osteoarthritis, stage IV CKD, nephrolithiasis, hyperlipidemia, hypertension, RBBB, Mobitz 1 heart block, pacemaker placement, spondylolisthesis, type 2 diabetes presented  w/ abdominal/chest pain, nausea and dizziness since he woke up from sleep on 07/19/23. He had recent episodes of pancreatitis and vertigo. He took some meclizine to relieve his symptoms.  ED course: Uncontrolled hypertension in 200s afebrile not hypoxic.  Labs shows glucose 276 creatinine 2.2 lipase 583 normal AST ALT and bilirubin, troponin 37> 28 CBC with hemoglobin 9.4.  A1c 9.1  CXR> normal. CT abdomen/pelvis without contrast>>acute pancreatitis and worsening surrounding inflammatory change and small volume free fluid.  Unchanged punctate nephrolithiasis that is nonobstructing in the left kidney.   Patient received morphine 4 mg IVP, ondansetron 4 mg IVP aspirin 324 mg p.o., amlodipine 10 mg p.o. x 1 and admitted for further management to [facility].  Patient continued to improve and tolerating soft diet.  No nausea or vomiting.  Pain has been resolved.  Lipase improved to 155 from 604.  Also found to have mild hypercalcemia which improved with IV fluid.  Patient also  developed mild hyperkalemia which resolved with Lokelma, potassium at 4.1 on the day of discharge.  Renal functions at baseline.  Hemoglobin decreased to 10 without any obvious bleeding.  Likely secondary to CKD.  Anemia panel consistent with anemia of chronic disease with some iron deficiency.  B12 was also low at 262 with a goal above 400.  Patient was started on supplement and likely will get benefit from getting EPO as outpatient.  Parathyroid hormone level was also sent with pending results-PCP can follow-up.  Patient need to follow-up with dermatology as outpatient for concern of Rosacea.  Our physical therapist evaluated him and recommended home health therapy which was ordered. At baseline he lives alone with his sons checking on him.  Patient will continue on current medications and need to have a close follow-up with his providers for further recommendations.  Consultants: Gastroenterology Procedures performed: None Disposition: Home health Diet recommendation:  Discharge Diet Orders (From admission, onward)     Start     Ordered   07/21/23 0000  Diet - low sodium heart healthy        07/21/23 1148           Cardiac and Carb modified diet DISCHARGE MEDICATION: Allergies as of 07/21/2023       Reactions   Fosinopril Cough   Fenofibrate Rash        Medication List     TAKE these medications    acetaminophen 500 MG tablet Commonly known as: TYLENOL Take 1,000 mg by mouth every 6 (six) hours as needed for mild pain.   allopurinol 100 MG tablet Commonly known as: ZYLOPRIM Take 100 mg  by mouth daily.   amLODipine 10 MG tablet Commonly known as: NORVASC Take 10 mg by mouth daily.   aspirin 81 MG tablet Take 81 mg by mouth daily.   cyanocobalamin 1000 MCG tablet Take 1 tablet (1,000 mcg total) by mouth daily.   dicyclomine 20 MG tablet Commonly known as: BENTYL Take 1 tablet (20 mg total) by mouth 2 (two) times daily for 14 days.   esomeprazole 20 MG  capsule Commonly known as: NexIUM Take 1 capsule (20 mg total) by mouth daily.   Fe Fum-Vit C-Vit B12-FA Caps capsule Commonly known as: TRIGELS-F FORTE Take 1 capsule by mouth 2 (two) times daily.   HumuLIN 70/30 (70-30) 100 UNIT/ML injection Generic drug: insulin NPH-regular Human Inject 15-40 Units into the skin daily. Inject 40 units with breakfast and 15 units with supper.   ondansetron 4 MG tablet Commonly known as: ZOFRAN Take 1 tablet (4 mg total) by mouth every 6 (six) hours as needed for nausea.   sodium bicarbonate 650 MG tablet Take 1,300 mg by mouth 2 (two) times daily.        Follow-up Information     Meryl Dare, MD Follow up on 10/05/2023.   Specialty: Gastroenterology Why: appointment at 8:30am Contact information: [address] [place] [address] [phone]         Rodrigo Ran, MD. Schedule an appointment as soon as possible for a visit in 1 week(s).   Specialty: Internal Medicine Contact information: [address] [place] [address] [phone]                Discharge Exam: Ceasar Mons Weights   07/19/23 1011  Weight: 79.4 kg   General.  Frail and hard of hearing elderly man, in no acute distress. Pulmonary.  Lungs clear bilaterally, normal respiratory effort. CV.  Regular rate and rhythm, no JVD, rub or murmur. Abdomen.  Soft, nontender, nondistended, BS positive. CNS.  Alert and oriented .  No focal neurologic deficit. Extremities.  No edema, no cyanosis, pulses intact and symmetrical. Psychiatry.  Judgment and insight appears normal.   Condition at discharge: stable  The results of significant diagnostics from this hospitalization (including imaging, microbiology, ancillary and laboratory) are listed below for reference.   Imaging Studies: CT ABDOMEN PELVIS WO CONTRAST  Result Date: 07/19/2023 CLINICAL DATA:  Abdominal and chest pain EXAM: CT ABDOMEN AND PELVIS WITHOUT CONTRAST TECHNIQUE: Multidetector CT  imaging of the abdomen and pelvis was performed following the standard protocol without IV contrast. RADIATION DOSE REDUCTION: This exam was performed according to the departmental dose-optimization program which includes automated exposure control, adjustment of the mA and/or kV according to patient size and/or use of iterative reconstruction technique. COMPARISON:  CT abdomen/pelvis 06/29/2023 FINDINGS: Lower chest: The lung bases are clear. Cardiac device leads are noted. Hepatobiliary: Scattered small hypodense lesions in the liver are too small to characterize but likely reflect benign cysts requiring no specific imaging follow-up. The gallbladder is unremarkable. There is no biliary ductal dilatation. Pancreas: Peripancreatic inflammatory fat stranding and small volume free fluid has progressed since the prior study from 06/29/2023. There is no main pancreatic ductal dilatation. There is no organized peripancreatic fluid collection. Spleen: Unremarkable. Adrenals/Urinary Tract: The adrenals are unremarkable. Hypodense lesions in both kidneys likely reflect benign cysts requiring no specific imaging follow-up. There is no suspicious lesion, within the confines of noncontrast technique. There is a punctate nonobstructing left lower pole renal stone. There is no hydronephrosis or hydroureter. The bladder is unremarkable.  Stomach/Bowel: The stomach is unremarkable. There is no evidence of bowel obstruction. There is no abnormal bowel wall thickening or inflammatory change. The appendix is not definitively identified. Vascular/Lymphatic: There is calcified plaque in the nonaneurysmal abdominal aorta. There is no abdominal or pelvic lymphadenopathy. Reproductive: The prostate and seminal vesicles are unremarkable. Other: There is trace free fluid in the pelvis. There is no free intraperitoneal air. Musculoskeletal: Postsurgical changes reflecting L4-L5 posterior instrumented fusion noted with adjacent segment disease  at L3-L4 and L5-S1. There is no perihardware lucency or other evidence of hardware related complication. There is no acute osseous abnormality or suspicious osseous lesion. IMPRESSION: 1. Acute pancreatitis with worsened surrounding inflammatory change and small volume free fluid since the study from 06/29/2023. No organized or drainable fluid collection. Note that necrosis can not be excluded in the absence of intravenous contrast. 2. Unchanged punctate nonobstructing left lower pole renal stone. Electronically Signed   By: Lesia Hausen M.D.   On: 07/19/2023 14:15   DG Chest Port 1 View  Result Date: 07/19/2023 CLINICAL DATA:  Chest pain. EXAM: PORTABLE CHEST 1 VIEW COMPARISON:  Mar 10, 2021. FINDINGS: The heart size and mediastinal contours are within normal limits. Left-sided pacemaker is unchanged in position. Both lungs are clear. The visualized skeletal structures are unremarkable. IMPRESSION: No active disease. Electronically Signed   By: Lupita Raider M.D.   On: 07/19/2023 12:02   CT ABDOMEN PELVIS WO CONTRAST  Result Date: 06/29/2023 CLINICAL DATA:  Acute nonlocalized abdominal pain. EXAM: CT ABDOMEN AND PELVIS WITHOUT CONTRAST TECHNIQUE: Multidetector CT imaging of the abdomen and pelvis was performed following the standard protocol without IV contrast. RADIATION DOSE REDUCTION: This exam was performed according to the departmental dose-optimization program which includes automated exposure control, adjustment of the mA and/or kV according to patient size and/or use of iterative reconstruction technique. COMPARISON:  02/17/2011 FINDINGS: Lower chest: Mild dependent atelectasis in the lung bases. Hepatobiliary: Several focal lesions in the liver, 3 subcentimeter lesions in the dome of the liver and a 10 mm lesion in the right lobe. No significant change since previous study. These likely represent cysts or hemangiomas. Gallbladder and bile ducts are normal. Pancreas: Infiltration diffusely around  the pancreas consistent with peripancreatic edema. This likely indicates acute pancreatitis. No loculated collections or pancreatic ductal dilatation or demonstrated. Spleen: Normal in size without focal abnormality. Adrenals/Urinary Tract: No adrenal gland nodules. Punctate sized calcifications in both renal lower poles. No hydronephrosis or hydroureter. Bladder is normal. Stomach/Bowel: Stomach, small bowel, and colon are not abnormally distended. No wall thickening is appreciated. Appendix is not identified. Vascular/Lymphatic: Aortic atherosclerosis. No enlarged abdominal or pelvic lymph nodes. Reproductive: Prostate is unremarkable. Other: No abdominal wall hernia or abnormality. No abdominopelvic ascites. Musculoskeletal: Degenerative changes in the spine and hips. Postoperative fixation in the lower lumbar spine. IMPRESSION: 1. Peripancreatic infiltration consistent with acute pancreatitis. No loculated collections identified. 2. Nonobstructing stones in the kidneys. 3. Aortic atherosclerosis. Electronically Signed   By: Burman Nieves M.D.   On: 06/29/2023 17:14    Microbiology: Results for orders placed or performed in visit on 10/16/19  Novel Coronavirus, NAA ([facility])     Status: None   Collection Time: 10/16/19  2:16 PM   Specimen: Nasopharyngeal(NP) swabs in vial transport medium   NASOPHARYNGE  TESTING  Result Value Ref Range Status   SARS-CoV-2, NAA Not Detected Not Detected Final    Comment: This nucleic acid amplification test was developed and its performance characteristics determined by [facility]. Nucleic acid amplification tests include PCR and TMA. This test has not been FDA cleared or approved. This test has been authorized by FDA under an Emergency Use Authorization (EUA). This test is only authorized for the duration of time the declaration that circumstances exist justifying the authorization of the emergency use of in vitro diagnostic tests for detection of  SARS-CoV-2 virus and/or diagnosis of COVID-19 infection under section 564(b)(1) of the Act, 21 U.S.C. 409WJX-9(J) (1), unless the authorization is terminated or revoked sooner. When diagnostic testing is negative, the possibility of a false negative result should be considered in the context of a patient's recent exposures and the presence of clinical signs and symptoms consistent with COVID-19. An individual without symptoms of COVID-19 and who is not shedding SARS-CoV-2 virus would  expect to have a negative (not detected) result in this assay.     Labs: CBC: Recent Labs  Lab 07/19/23 1033 07/20/23 0456 07/21/23 0442  WBC 8.9 9.6 9.2  HGB 11.4* 11.8* 10.0*  HCT 35.8* 37.8* 32.5*  MCV 86.9 89.6 90.5  PLT 270 241 143*   Basic Metabolic Panel: Recent Labs  Lab 07/19/23 1033 07/20/23 0456 07/21/23 0442  NA 137 136 132*  K 4.8 5.4* 4.1  CL 108 108 107  CO2 17* 20* 20*  GLUCOSE 279* 276* 119*  BUN 36* 32* 25*  CREATININE 2.29* 2.15* 2.03*  CALCIUM 10.1 9.9 9.6   Liver Function Tests: Recent Labs  Lab 07/19/23 1033 07/20/23 0456  AST 16 13*  ALT 16 13  ALKPHOS 78 71  BILITOT 0.6 0.7  PROT 6.6 6.2*  ALBUMIN 3.4* 3.0*   CBG: Recent Labs  Lab 07/20/23 2002 07/20/23 2333 07/21/23 0448 07/21/23 0750 07/21/23 1143  GLUCAP 216* 241* 115* 131* 258*    Discharge time spent: greater than 30 minutes.  This record has been created using Conservation officer, historic buildings. Errors have been sought and corrected,but may not always be located. Such creation errors do not reflect on the standard of care.   Signed: Arnetha Courser, MD [facility] 07/21/2023

## 2023-07-22 ENCOUNTER — Ambulatory Visit (INDEPENDENT_AMBULATORY_CARE_PROVIDER_SITE_OTHER): Payer: Medicare Other

## 2023-07-22 DIAGNOSIS — I442 Atrioventricular block, complete: Secondary | ICD-10-CM

## 2023-07-23 ENCOUNTER — Telehealth: Payer: Self-pay | Admitting: Hematology and Oncology

## 2023-07-23 LAB — PTH, INTACT AND CALCIUM
Calcium, Total (PTH): 9.8 mg/dL (ref 8.6–10.2)
PTH: 6 pg/mL — ABNORMAL LOW (ref 15–65)

## 2023-07-25 LAB — CUP PACEART REMOTE DEVICE CHECK
Battery Remaining Longevity: 49 mo
Battery Remaining Percentage: 45 %
Battery Voltage: 2.98 V
Brady Statistic AP VP Percent: 90 %
Brady Statistic AP VS Percent: 1 %
Brady Statistic AS VP Percent: 9.5 %
Brady Statistic AS VS Percent: 1 %
Brady Statistic RA Percent Paced: 90 %
Brady Statistic RV Percent Paced: 99 %
Date Time Interrogation Session: 20240928155823
Implantable Lead Connection Status: 753985
Implantable Lead Connection Status: 753985
Implantable Lead Implant Date: 20190925
Implantable Lead Implant Date: 20190925
Implantable Lead Location: 753859
Implantable Lead Location: 753860
Implantable Pulse Generator Implant Date: 20190925
Lead Channel Impedance Value: 400 Ohm
Lead Channel Impedance Value: 460 Ohm
Lead Channel Pacing Threshold Amplitude: 0.625 V
Lead Channel Pacing Threshold Amplitude: 0.75 V
Lead Channel Pacing Threshold Pulse Width: 0.5 ms
Lead Channel Pacing Threshold Pulse Width: 0.5 ms
Lead Channel Sensing Intrinsic Amplitude: 12 mV
Lead Channel Sensing Intrinsic Amplitude: 4.1 mV
Lead Channel Setting Pacing Amplitude: 1 V
Lead Channel Setting Pacing Amplitude: 1.625
Lead Channel Setting Pacing Pulse Width: 0.5 ms
Lead Channel Setting Sensing Sensitivity: 4 mV
Pulse Gen Model: 2272
Pulse Gen Serial Number: 9066127

## 2023-07-28 ENCOUNTER — Telehealth: Payer: Self-pay | Admitting: Internal Medicine

## 2023-07-28 NOTE — Telephone Encounter (Signed)
Patient seen by me in hospital - was given December appointment w/ Dr. Russella Dar  Needs sooner appt and since Dr. Russella Dar retiring please arrange an October (mid-month or so) appointment with me re: acute pancreatitis  We can cancel the Dec appt w/ Russella Dar  May use an 1130 or 350 spot for me

## 2023-07-29 ENCOUNTER — Ambulatory Visit: Payer: Medicare Other | Attending: Cardiovascular Disease | Admitting: Cardiovascular Disease

## 2023-07-29 NOTE — Telephone Encounter (Signed)
Scheduled OV with patient for 08/10/23 at 11:30 am. Address provided & advised him on when/where to go for appointment. Pt verbalized all understanding.

## 2023-07-29 NOTE — Telephone Encounter (Signed)
Unable to reach patient, line rings then signal goes busy. Will try again at a later time.

## 2023-07-30 ENCOUNTER — Encounter: Payer: Self-pay | Admitting: Cardiovascular Disease

## 2023-07-30 NOTE — Progress Notes (Signed)
Remote pacemaker transmission.   

## 2023-08-04 ENCOUNTER — Ambulatory Visit: Payer: Medicare Other | Admitting: Internal Medicine

## 2023-08-08 ENCOUNTER — Emergency Department (HOSPITAL_BASED_OUTPATIENT_CLINIC_OR_DEPARTMENT_OTHER): Payer: Medicare Other

## 2023-08-08 ENCOUNTER — Inpatient Hospital Stay (HOSPITAL_BASED_OUTPATIENT_CLINIC_OR_DEPARTMENT_OTHER)
Admission: EM | Admit: 2023-08-08 | Discharge: 2023-08-11 | DRG: 638 | Disposition: A | Discharge status: Home Health | Payer: Medicare Other | Attending: Internal Medicine | Admitting: Internal Medicine

## 2023-08-08 ENCOUNTER — Other Ambulatory Visit: Payer: Self-pay

## 2023-08-08 ENCOUNTER — Encounter (HOSPITAL_BASED_OUTPATIENT_CLINIC_OR_DEPARTMENT_OTHER): Payer: Self-pay | Admitting: Emergency Medicine

## 2023-08-08 DIAGNOSIS — E1110 Type 2 diabetes mellitus with ketoacidosis without coma: Secondary | ICD-10-CM | POA: Diagnosis not present

## 2023-08-08 DIAGNOSIS — M4310 Spondylolisthesis, site unspecified: Secondary | ICD-10-CM | POA: Diagnosis present

## 2023-08-08 DIAGNOSIS — Z7982 Long term (current) use of aspirin: Secondary | ICD-10-CM

## 2023-08-08 DIAGNOSIS — Z800 Family history of malignant neoplasm of digestive organs: Secondary | ICD-10-CM

## 2023-08-08 DIAGNOSIS — D631 Anemia in chronic kidney disease: Secondary | ICD-10-CM | POA: Diagnosis present

## 2023-08-08 DIAGNOSIS — Z950 Presence of cardiac pacemaker: Secondary | ICD-10-CM

## 2023-08-08 DIAGNOSIS — E1100 Type 2 diabetes mellitus with hyperosmolarity without nonketotic hyperglycemic-hyperosmolar coma (NKHHC): Principal | ICD-10-CM | POA: Diagnosis present

## 2023-08-08 DIAGNOSIS — I7389 Other specified peripheral vascular diseases: Secondary | ICD-10-CM

## 2023-08-08 DIAGNOSIS — N179 Acute kidney failure, unspecified: Secondary | ICD-10-CM | POA: Diagnosis present

## 2023-08-08 DIAGNOSIS — I4510 Unspecified right bundle-branch block: Secondary | ICD-10-CM | POA: Diagnosis present

## 2023-08-08 DIAGNOSIS — D72829 Elevated white blood cell count, unspecified: Secondary | ICD-10-CM | POA: Diagnosis present

## 2023-08-08 DIAGNOSIS — G8929 Other chronic pain: Secondary | ICD-10-CM | POA: Diagnosis present

## 2023-08-08 DIAGNOSIS — E1122 Type 2 diabetes mellitus with diabetic chronic kidney disease: Secondary | ICD-10-CM | POA: Diagnosis present

## 2023-08-08 DIAGNOSIS — Z87891 Personal history of nicotine dependence: Secondary | ICD-10-CM

## 2023-08-08 DIAGNOSIS — N184 Chronic kidney disease, stage 4 (severe): Secondary | ICD-10-CM | POA: Diagnosis present

## 2023-08-08 DIAGNOSIS — I129 Hypertensive chronic kidney disease with stage 1 through stage 4 chronic kidney disease, or unspecified chronic kidney disease: Secondary | ICD-10-CM | POA: Diagnosis present

## 2023-08-08 DIAGNOSIS — N1832 Chronic kidney disease, stage 3b: Secondary | ICD-10-CM | POA: Diagnosis present

## 2023-08-08 DIAGNOSIS — E860 Dehydration: Secondary | ICD-10-CM | POA: Diagnosis present

## 2023-08-08 DIAGNOSIS — R2689 Other abnormalities of gait and mobility: Secondary | ICD-10-CM

## 2023-08-08 DIAGNOSIS — Z888 Allergy status to other drugs, medicaments and biological substances status: Secondary | ICD-10-CM

## 2023-08-08 DIAGNOSIS — Z794 Long term (current) use of insulin: Secondary | ICD-10-CM

## 2023-08-08 DIAGNOSIS — Z8551 Personal history of malignant neoplasm of bladder: Secondary | ICD-10-CM

## 2023-08-08 DIAGNOSIS — Z87442 Personal history of urinary calculi: Secondary | ICD-10-CM

## 2023-08-08 DIAGNOSIS — M1990 Unspecified osteoarthritis, unspecified site: Secondary | ICD-10-CM | POA: Diagnosis present

## 2023-08-08 DIAGNOSIS — Z8701 Personal history of pneumonia (recurrent): Secondary | ICD-10-CM

## 2023-08-08 DIAGNOSIS — E785 Hyperlipidemia, unspecified: Secondary | ICD-10-CM | POA: Diagnosis present

## 2023-08-08 DIAGNOSIS — I441 Atrioventricular block, second degree: Secondary | ICD-10-CM | POA: Diagnosis present

## 2023-08-08 DIAGNOSIS — E119 Type 2 diabetes mellitus without complications: Secondary | ICD-10-CM

## 2023-08-08 DIAGNOSIS — I10 Essential (primary) hypertension: Secondary | ICD-10-CM | POA: Diagnosis present

## 2023-08-08 DIAGNOSIS — R296 Repeated falls: Secondary | ICD-10-CM | POA: Diagnosis present

## 2023-08-08 DIAGNOSIS — Z833 Family history of diabetes mellitus: Secondary | ICD-10-CM

## 2023-08-08 DIAGNOSIS — H9190 Unspecified hearing loss, unspecified ear: Secondary | ICD-10-CM | POA: Diagnosis present

## 2023-08-08 DIAGNOSIS — Z79899 Other long term (current) drug therapy: Secondary | ICD-10-CM

## 2023-08-08 LAB — CBC WITH DIFFERENTIAL/PLATELET
Abs Immature Granulocytes: 0.04 10*3/uL (ref 0.00–0.07)
Basophils Absolute: 0.1 10*3/uL (ref 0.0–0.1)
Basophils Relative: 0 %
Eosinophils Absolute: 0 10*3/uL (ref 0.0–0.5)
Eosinophils Relative: 0 %
HCT: 39.2 % (ref 39.0–52.0)
Hemoglobin: 12.8 g/dL — ABNORMAL LOW (ref 13.0–17.0)
Immature Granulocytes: 0 %
Lymphocytes Relative: 34 %
Lymphs Abs: 4.2 10*3/uL — ABNORMAL HIGH (ref 0.7–4.0)
MCH: 27.5 pg (ref 26.0–34.0)
MCHC: 32.7 g/dL (ref 30.0–36.0)
MCV: 84.3 fL (ref 80.0–100.0)
Monocytes Absolute: 0.9 10*3/uL (ref 0.1–1.0)
Monocytes Relative: 7 %
Neutro Abs: 7.2 10*3/uL (ref 1.7–7.7)
Neutrophils Relative %: 59 %
Platelets: 333 10*3/uL (ref 150–400)
RBC: 4.65 MIL/uL (ref 4.22–5.81)
RDW: 15.1 % (ref 11.5–15.5)
WBC: 12.4 10*3/uL — ABNORMAL HIGH (ref 4.0–10.5)
nRBC: 0 % (ref 0.0–0.2)

## 2023-08-08 LAB — CBG MONITORING, ED
Glucose-Capillary: >600 mg/dL (ref 70–99)
Glucose-Capillary: 565 mg/dL (ref 70–99)
Glucose-Capillary: 578 mg/dL (ref 70–99)
Glucose-Capillary: >600 mg/dL (ref 70–99)
Glucose-Capillary: 395 mg/dL — ABNORMAL HIGH (ref 70–99)
Glucose-Capillary: 510 mg/dL (ref 70–99)
Glucose-Capillary: 534 mg/dL (ref 70–99)
Glucose-Capillary: 542 mg/dL (ref 70–99)
Glucose-Capillary: 407 mg/dL — ABNORMAL HIGH (ref 70–99)
Glucose-Capillary: 321 mg/dL — ABNORMAL HIGH (ref 70–99)
Glucose-Capillary: 154 mg/dL — ABNORMAL HIGH (ref 70–99)

## 2023-08-08 LAB — BASIC METABOLIC PANEL
Anion gap: 15 (ref 5–15)
BUN: 46 mg/dL — ABNORMAL HIGH (ref 8–23)
CO2: 18 mmol/L — ABNORMAL LOW (ref 22–32)
Calcium: 10.2 mg/dL (ref 8.9–10.3)
Chloride: 101 mmol/L (ref 98–111)
Creatinine, Ser: 2.55 mg/dL — ABNORMAL HIGH (ref 0.61–1.24)
Glucose, Bld: 554 mg/dL (ref 70–99)
Potassium: 4.9 mmol/L (ref 3.5–5.1)
Sodium: 134 mmol/L — ABNORMAL LOW (ref 135–145)

## 2023-08-08 LAB — COMPREHENSIVE METABOLIC PANEL
ALT: 34 U/L (ref 0–44)
AST: 31 U/L (ref 15–41)
Albumin: 3.9 g/dL (ref 3.5–5.0)
Alkaline Phosphatase: 99 U/L (ref 38–126)
Anion gap: 18 — ABNORMAL HIGH (ref 5–15)
BUN: 47 mg/dL — ABNORMAL HIGH (ref 8–23)
CO2: 16 mmol/L — ABNORMAL LOW (ref 22–32)
Calcium: 10.4 mg/dL — ABNORMAL HIGH (ref 8.9–10.3)
Chloride: 96 mmol/L — ABNORMAL LOW (ref 98–111)
Creatinine, Ser: 2.81 mg/dL — ABNORMAL HIGH (ref 0.61–1.24)
GFR, Estimated: 21 mL/min — ABNORMAL LOW (ref >60)
Glucose, Bld: >1200 mg/dL (ref 70–99)
Potassium: 4.7 mmol/L (ref 3.5–5.1)
Sodium: 130 mmol/L — ABNORMAL LOW (ref 135–145)
Total Bilirubin: 1 mg/dL (ref 0.3–1.2)
Total Protein: 7.6 g/dL (ref 6.5–8.1)

## 2023-08-08 LAB — I-STAT VENOUS BLOOD GAS, ED
Acid-base deficit: 6 mmol/L — ABNORMAL HIGH (ref 0.0–2.0)
Bicarbonate: 17.1 mmol/L — ABNORMAL LOW (ref 20.0–28.0)
Calcium, Ion: 1.26 mmol/L (ref 1.15–1.40)
HCT: 42 % (ref 39.0–52.0)
Hemoglobin: 14.3 g/dL (ref 13.0–17.0)
O2 Saturation: 73 %
Potassium: 4.8 mmol/L (ref 3.5–5.1)
Sodium: 130 mmol/L — ABNORMAL LOW (ref 135–145)
TCO2: 18 mmol/L — ABNORMAL LOW (ref 22–32)
pCO2, Ven: 27.1 mm[Hg] — ABNORMAL LOW (ref 44–60)
pH, Ven: 7.407 (ref 7.25–7.43)
pO2, Ven: 38 mm[Hg] (ref 32–45)

## 2023-08-08 LAB — URINALYSIS, ROUTINE W REFLEX MICROSCOPIC
Bilirubin Urine: NEGATIVE
Glucose, UA: >=500 mg/dL — AB
Ketones, ur: NEGATIVE mg/dL
Leukocytes,Ua: NEGATIVE
Nitrite: NEGATIVE
Protein, ur: 100 mg/dL — AB
Specific Gravity, Urine: 1.015 (ref 1.005–1.030)
pH: 6 (ref 5.0–8.0)

## 2023-08-08 LAB — URINALYSIS, MICROSCOPIC (REFLEX)

## 2023-08-08 LAB — BETA-HYDROXYBUTYRIC ACID
Beta-Hydroxybutyric Acid: 2.83 mmol/L — ABNORMAL HIGH (ref 0.05–0.27)
Beta-Hydroxybutyric Acid: 0.94 mmol/L — ABNORMAL HIGH (ref 0.05–0.27)

## 2023-08-08 LAB — LIPASE, BLOOD: Lipase: 195 U/L — ABNORMAL HIGH (ref 11–51)

## 2023-08-08 LAB — MAGNESIUM: Magnesium: 2.5 mg/dL — ABNORMAL HIGH (ref 1.7–2.4)

## 2023-08-08 LAB — GLUCOSE, CAPILLARY: Glucose-Capillary: >600 mg/dL (ref 70–99)

## 2023-08-08 LAB — OSMOLALITY: Osmolality: 333 mosm/kg (ref 275–295)

## 2023-08-08 MED ORDER — LACTATED RINGERS IV BOLUS: 20.0000 mL/kg | Freq: Once | INTRAVENOUS | Status: DC

## 2023-08-08 MED ORDER — DEXTROSE IN LACTATED RINGERS 5 % IV SOLN: INTRAVENOUS | Status: DC

## 2023-08-08 MED ORDER — LACTATED RINGERS IV SOLN: INTRAVENOUS | Status: DC

## 2023-08-08 MED ORDER — INSULIN REGULAR(HUMAN) IN NACL 100-0.9 UT/100ML-% IV SOLN: INTRAVENOUS | Status: DC

## 2023-08-08 MED ORDER — DEXTROSE 50 % IV SOLN: 0.0000 mL | INTRAVENOUS | Status: DC | PRN

## 2023-08-08 MED ORDER — POTASSIUM CHLORIDE 10 MEQ/100ML IV SOLN
10.0000 meq | INTRAVENOUS | Status: AC
  Administered 2023-08-08 (×2): 10 meq via INTRAVENOUS
  Filled 2023-08-08 (×2): qty 100

## 2023-08-08 MED ORDER — LACTATED RINGERS IV BOLUS
1500.0000 mL | Freq: Once | INTRAVENOUS | Status: AC
  Administered 2023-08-08: 1500 mL via INTRAVENOUS

## 2023-08-08 MED ORDER — INSULIN REGULAR(HUMAN) IN NACL 100-0.9 UT/100ML-% IV SOLN
INTRAVENOUS | Status: DC
  Administered 2023-08-08: 10 [IU]/h via INTRAVENOUS
  Filled 2023-08-08: qty 100

## 2023-08-08 NOTE — Progress Notes (Addendum)
Plan of Care Note for accepted transfer  Patient: Keith Drake              ZOX:[id number]  DOA: 08/08/2023     Facility requesting transfer: [facility] Requesting Provider: Lonell Grandchild, MD   Reason for transfer: Management of HHS versus early development of DKA and chronic pancreatitis.  Facility course:  7 yom w/ anemia, bladder cancer, osteoarthritis, stage IV CKD, nephrolithiasis, hyperlipidemia, hypertension, RBBB, Mobitz 1 heart block, pacemaker placement, spondylolisthesis and type 2 diabetes presented to emergency department for complaining about confusion, weakness and vomiting.   Patient and family report weakness over the past 2 to 3 days associated with vomiting. Today patient's blood sugar is elevated. Family thinks he is taking his insulin and medications as prescribed. Patient reports some mild diffuse abdominal pain with it. No fevers, chills. No chest pain or shortness of breath. No leg swelling. No urinary symptoms. No diarrhea.   At presentation to ED patient was hemodynamically stable however blood pressure continued to trend up to 176/84.  CBC showing leukocytosis 12.4 and stable H&H 12.8 and 39.  CMP showing sodium 130, potassium 4.7, chloride 96, bicarb 16, elevated blood glucose 1200, BUN 47, creatinine 2.81, calcium 10.4, GFR 21 and elevated anion gap 18. Elevated beta-hydroxybutyrate 2.83. Been BG 7.4, pCO2 27, pO2 38, bicarb 17. Elevated lipase 195.  CT abdomen finding: IMPRESSION: 1. Near complete resolution of previously seen peripancreatic inflammatory changes. No other findings to explain the patient's symptoms.  Chest x-ray no acute cardiopulmonary disease.  With the concern for development of HHS versus early DKA patient being started on insulin drip with with protocol.  Hospitalist has been contacted for further evaluation management of DKA versus HHS and chronic pancreatitis.  Plan of care: The patient is accepted for admission  for inpatient status to Progressive unit, at [facility] or [facility] long hospital.  Check [email or web address] for on-call coverage.  [facility] will assume care on arrival to accepting facility. Until arrival, medical decision making responsibilities remain with the EDP.  However, [facility] available 24/7 for questions and assistance.   Nursing staff please page [facility] Admits and Consults [phone]) as soon as the patient arrives to the hospital.    Author: Tereasa Coop, MD  08/08/2023  [facility]

## 2023-08-08 NOTE — ED Notes (Signed)
Called and gave report to [place], Charity fundraiser at [facility].

## 2023-08-08 NOTE — ED Notes (Signed)
RT Note:  VBG completed and handed to Dr. Suezanne Jacquet

## 2023-08-08 NOTE — ED Notes (Signed)
[facility] called for transport. 

## 2023-08-08 NOTE — ED Provider Notes (Signed)
[place] [facility] Provider Note  CSN: [id number] Arrival date & time: 08/08/23 1556  Chief Complaint(s) Emesis  HPI Keith Drake is a 88 y.o. male with history of hypertension, hyperlipidemia, diabetes, pancreatitis, CKD presenting to the emergency department with weakness.  Patient and family report weakness over the past 2 to 3 days associated with vomiting.  Today patient's blood sugar is elevated.  Family thinks he is taking his insulin and medications as prescribed.  Patient reports some mild diffuse abdominal pain with it.  No fevers, chills.  No chest pain or shortness of breath.  No leg swelling.  No urinary symptoms.  No diarrhea.   Past Medical History Past Medical History:  Diagnosis Date   Anemia    Arthritis    OCC LEG PAIN   Bladder cancer (HCC) TCC   History of bladder carcinoma; S/P BCG TX'S ; "never treated for cancer; it was a spot on my bladder" (07/20/2018)   CKD (chronic kidney disease), stage III (HCC)    History of gout    "on daily RX" (07/20/2018)   History of kidney stones    Hyperlipidemia    Hypertension    Impaired hearing BILATERAL    OCCASIONAL WEARS HEARING AIDS   Mobitz I    [name] 07/20/2018   Pneumonia  ~ 1980s X 1   Presence of permanent cardiac pacemaker 07/20/2018   Right bundle branch block    Spondylolisthesis    Type II diabetes mellitus (HCC)    Patient Active Problem List   Diagnosis Date Noted   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   Hypercalcemia 07/20/2023   Acute pancreatitis 07/19/2023   Impairment of balance 07/19/2023   Type 2 diabetes mellitus (HCC) 07/19/2023   CKD (chronic kidney disease), stage IV (HCC) 07/19/2023   Anemia in chronic renal disease 07/19/2023   Mild protein malnutrition (HCC) 07/19/2023   Rosacea 07/19/2023   Infective urethritis 08/30/2018   Spondylolisthesis at L4-L5 level 08/15/2018   Symptomatic bradycardia 07/20/2018   Second degree AV block,  Mobitz type I 07/20/2018   Essential hypertension 03/23/2013   Hyperlipidemia 03/23/2013   Insulin dependent diabetes mellitus 03/23/2013   Right bundle branch block    Home Medication(s) Prior to Admission medications   Medication Sig Start Date End Date Taking? Authorizing Provider  acetaminophen (TYLENOL) 500 MG tablet Take 1,000 mg by mouth every 6 (six) hours as needed for mild pain.    [provider]  allopurinol (ZYLOPRIM) 100 MG tablet Take 100 mg by mouth daily. 07/07/18   [provider]  amLODipine (NORVASC) 10 MG tablet Take 10 mg by mouth daily.  12/09/15   [provider]  aspirin 81 MG tablet Take 81 mg by mouth daily.    [provider]  cyanocobalamin 1000 MCG tablet Take 1 tablet (1,000 mcg total) by mouth daily. 07/21/23   Arnetha Courser, MD  dicyclomine (BENTYL) 20 MG tablet Take 1 tablet (20 mg total) by mouth 2 (two) times daily for 14 days. 06/29/23 07/19/23  Maxwell Marion, PA-C  esomeprazole (NEXIUM) 20 MG capsule Take 1 capsule (20 mg total) by mouth daily. 07/21/23 07/20/24  Arnetha Courser, MD  Fe Fum-Vit C-Vit B12-FA (TRIGELS-F FORTE) CAPS capsule Take 1 capsule by mouth 2 (two) times daily. 07/21/23   Arnetha Courser, MD  HUMULIN 70/30 (70-30) 100 UNIT/ML injection Inject 15-40 Units into the skin daily. Inject 40 units with breakfast and 15 units with supper. 12/24/15  [provider]  ondansetron (ZOFRAN) 4 MG tablet Take 1 tablet (4 mg total) by mouth every 6 (six) hours as needed for nausea. 07/21/23   Arnetha Courser, MD  sodium bicarbonate 650 MG tablet Take 1,300 mg by mouth 2 (two) times daily. 06/03/23   [provider]                                                                                                                                    Past Surgical History Past Surgical History:  Procedure Laterality Date   COLONOSCOPY  03/2004   CYSTO / RESECTIONAL BLADDER BX'S  05-01-2011   CYSTOSCOPY WITH BIOPSY   10/05/2011   Procedure: CYSTOSCOPY WITH BIOPSY;  Surgeon: Garnett Farm, MD;  Location: [facility];  Service: Urology;  Laterality: N/A;  BLADDER BIOPSY    CYSTOSCOPY WITH LITHOLAPAXY     EXCISIONAL HEMORRHOIDECTOMY  1980s   INSERT / REPLACE / REMOVE PACEMAKER  07/20/2018   Implantation of new dual chamber permanent pacemaker   PACEMAKER IMPLANT N/A 07/20/2018   Procedure: PACEMAKER IMPLANT;  Surgeon: Thurmon Fair, MD;  Location: [facility];  Service: Cardiovascular;  Laterality: N/A;   REFRACTIVE SURGERY Bilateral    TRANSURETHRAL RESECTION OF BLADDER TUMOR  03-02-2011   US ECHOCARDIOGRAPHY  07-11-2009   EF 55-60%   Family History Family History  Problem Relation Age of Onset   Colon cancer Brother    Cancer Brother    Cancer Brother    Diabetes Sister     Social History Social History   Tobacco Use   Smoking status: Former    Types: Cigars   Smokeless tobacco: Never  Vaping Use   Vaping status: Never Used  Substance Use Topics   Alcohol use: Not Currently    Comment: 08/12/18 "1 beer/year"   Drug use: Never   Allergies Fosinopril and Fenofibrate  Review of Systems Review of Systems  All other systems reviewed and are negative.   Physical Exam Vital Signs  I have reviewed the triage vital signs BP (!) 147/82   Pulse 65   Temp 97.9 F (36.6 C) (Oral)   Resp 15   Ht 5\' 11"  (1.803 m)   Wt 79.4 kg   SpO2 100%   BMI 24.41 kg/m  Physical Exam Vitals and nursing note reviewed.  Constitutional:      General: He is not in acute distress.    Appearance: Normal appearance.  HENT:     Mouth/Throat:     Mouth: Mucous membranes are dry.  Eyes:     Conjunctiva/sclera: Conjunctivae normal.  Cardiovascular:     Rate and Rhythm: Normal rate and regular rhythm.  Pulmonary:     Effort: Pulmonary effort is normal. No respiratory distress.     Breath sounds: Normal breath sounds.  Abdominal:     General: Abdomen is flat.     Palpations:  Abdomen  is soft.     Tenderness: There is abdominal tenderness (mild diffuse).  Musculoskeletal:     Right lower leg: No edema.     Left lower leg: No edema.  Skin:    General: Skin is warm and dry.     Capillary Refill: Capillary refill takes less than 2 seconds.  Neurological:     Mental Status: He is alert and oriented to person, place, and time. Mental status is at baseline.  Psychiatric:        Mood and Affect: Mood normal.        Behavior: Behavior normal.     ED Results and Treatments Labs (all labs ordered are listed, but only abnormal results are displayed) Labs Reviewed  CBC WITH DIFFERENTIAL/PLATELET - Abnormal; Notable for the following components:      Result Value   WBC 12.4 (*)    Hemoglobin 12.8 (*)    Lymphs Abs 4.2 (*)    All other components within normal limits  COMPREHENSIVE METABOLIC PANEL - Abnormal; Notable for the following components:   Sodium 130 (*)    Chloride 96 (*)    CO2 16 (*)    Glucose, Bld >1,200 (*)    BUN 47 (*)    Creatinine, Ser 2.81 (*)    Calcium 10.4 (*)    GFR, Estimated 21 (*)    Anion gap 18 (*)    All other components within normal limits  LIPASE, BLOOD - Abnormal; Notable for the following components:   Lipase 195 (*)    All other components within normal limits  GLUCOSE, CAPILLARY - Abnormal; Notable for the following components:   Glucose-Capillary >600 (*)    All other components within normal limits  MAGNESIUM - Abnormal; Notable for the following components:   Magnesium 2.5 (*)    All other components within normal limits  BETA-HYDROXYBUTYRIC ACID - Abnormal; Notable for the following components:   Beta-Hydroxybutyric Acid 2.83 (*)    All other components within normal limits  OSMOLALITY - Abnormal; Notable for the following components:   Osmolality 333 (*)    All other components within normal limits  BASIC METABOLIC PANEL - Abnormal; Notable for the following components:   Sodium 134 (*)    CO2 18 (*)     Glucose, Bld 554 (*)    BUN 46 (*)    Creatinine, Ser 2.55 (*)    All other components within normal limits  CBG MONITORING, ED - Abnormal; Notable for the following components:   Glucose-Capillary >600 (*)    All other components within normal limits  I-STAT VENOUS BLOOD GAS, ED - Abnormal; Notable for the following components:   pCO2, Ven 27.1 (*)    Bicarbonate 17.1 (*)    TCO2 18 (*)    Acid-base deficit 6.0 (*)    Sodium 130 (*)    All other components within normal limits  CBG MONITORING, ED - Abnormal; Notable for the following components:   Glucose-Capillary 578 (*)    All other components within normal limits  CBG MONITORING, ED - Abnormal; Notable for the following components:   Glucose-Capillary >600 (*)    All other components within normal limits  CBG MONITORING, ED - Abnormal; Notable for the following components:   Glucose-Capillary 542 (*)    All other components within normal limits  CBG MONITORING, ED - Abnormal; Notable for the following components:   Glucose-Capillary 565 (*)    All other components within normal limits  CBG MONITORING,  ED - Abnormal; Notable for the following components:   Glucose-Capillary 534 (*)    All other components within normal limits  CBG MONITORING, ED - Abnormal; Notable for the following components:   Glucose-Capillary 510 (*)    All other components within normal limits  CBG MONITORING, ED - Abnormal; Notable for the following components:   Glucose-Capillary 395 (*)    All other components within normal limits  CBG MONITORING, ED - Abnormal; Notable for the following components:   Glucose-Capillary 407 (*)    All other components within normal limits  URINALYSIS, ROUTINE W REFLEX MICROSCOPIC  BASIC METABOLIC PANEL  BASIC METABOLIC PANEL  BASIC METABOLIC PANEL  BASIC METABOLIC PANEL  BETA-HYDROXYBUTYRIC ACID  BETA-HYDROXYBUTYRIC ACID                                                                                                                           Radiology CT ABDOMEN PELVIS WO CONTRAST  Result Date: 08/08/2023 CLINICAL DATA:  Abdominal pain. EXAM: CT ABDOMEN AND PELVIS WITHOUT CONTRAST TECHNIQUE: Multidetector CT imaging of the abdomen and pelvis was performed following the standard protocol without IV contrast. RADIATION DOSE REDUCTION: This exam was performed according to the departmental dose-optimization program which includes automated exposure control, adjustment of the mA and/or kV according to patient size and/or use of iterative reconstruction technique. COMPARISON:  CT abdomen pelvis dated 07/19/2023. FINDINGS: Lower chest: Mild bibasilar atelectasis. Hepatobiliary: Multiple hepatic cysts are noted with the largest measuring 1.3 cm. No imaging follow-up is recommended. No gallstones, gallbladder wall thickening, or biliary dilatation. Pancreas: Previously seen peripancreatic inflammatory changes have nearly completely resolved. No pancreatic ductal dilatation or surrounding inflammatory changes. Spleen: Normal in size without focal abnormality. Adrenals/Urinary Tract: Adrenal glands are unremarkable. Bilateral renal cysts measure up to 1.6 cm. A nonobstructive left renal calculus measures 2 mm. No renal calculi on the right. No hydronephrosis on either side. A few bladder diverticuli are seen posteriorly. Stomach/Bowel: Stomach is within normal limits. The appendix is likely absent. No evidence of bowel wall thickening, distention, or inflammatory changes. Vascular/Lymphatic: Aortic atherosclerosis. No enlarged abdominal or pelvic lymph nodes. Reproductive: Prostate is unremarkable. Other: No abdominal wall hernia or abnormality. No abdominopelvic ascites. Musculoskeletal: Fixation hardware at L4-L5 is redemonstrated. Degenerative changes are seen in the spine, most significant at L3-L4. IMPRESSION: 1. Near complete resolution of previously seen peripancreatic inflammatory changes. No other findings to explain  the patient's symptoms. Aortic Atherosclerosis (ICD10-I70.0). Electronically Signed   By: Romona Curls M.D.   On: 08/08/2023 18:50   DG Chest 2 View  Result Date: 08/08/2023 CLINICAL DATA:  Emesis for 3 days. Hyperglycemia. Question pneumonia. EXAM: CHEST - 2 VIEW COMPARISON:  One-view chest x-ray 07/19/2023 FINDINGS: The heart size is normal. Pacing wires are stable. The lungs are clear. The visualized soft tissues and bony thorax are unremarkable. IMPRESSION: No acute cardiopulmonary disease. Electronically Signed   By: Marin Roberts M.D.   On: 08/08/2023 17:14  Pertinent labs & imaging results that were available during my care of the patient were reviewed by me and considered in my medical decision making (see MDM for details).  Medications Ordered in ED Medications  insulin regular, human (MYXREDLIN) 100 units/ 100 mL infusion (3.8 Units/hr Intravenous Rate/Dose Change 08/08/23 2029)  lactated ringers infusion ( Intravenous New Bag/Given 08/08/23 1838)  dextrose 50 % solution 0-50 mL (has no administration in time range)  potassium chloride 10 mEq in 100 mL IVPB (10 mEq Intravenous New Bag/Given 08/08/23 2058)  dextrose 5 % in lactated ringers infusion ( Intravenous Not Given 08/08/23 2044)  lactated ringers bolus 1,500 mL (0 mLs Intravenous Stopped 08/08/23 1932)                                                                                                                                     Procedures .Critical Care  Performed by: Lonell Grandchild, MD Authorized by: Lonell Grandchild, MD   Critical care provider statement:    Critical care time (minutes):  30   Critical care was necessary to treat or prevent imminent or life-threatening deterioration of the following conditions:  Endocrine crisis   Critical care was time spent personally by me on the following activities:  Development of treatment plan with patient or surrogate, discussions with consultants,  evaluation of patient's response to treatment, examination of patient, ordering and review of laboratory studies, ordering and review of radiographic studies, ordering and performing treatments and interventions, pulse oximetry, re-evaluation of patient's condition and review of old charts   Care discussed with: admitting provider and accepting provider at another facility     (including critical care time)  Medical Decision Making / ED Course   MDM:  88 year old presenting to the emergency department with weakness, vomiting.  Patient has very elevated blood sugar.  He appears dehydrated.  Differential includes DKA, HHS, pancreatitis, appendicitis, urinary infection, obstruction, perforation, electrolyte derangement, dehydration.  Will check labs.  Will give fluids.  Will reassess.  VBG reassuring  Clinical Course as of 08/08/23 2107  Wynelle Link Aug 08, 2023  2104 Labs notable for HHS with elevated serum osm and hyperglycemia.  May also have component of DKA with anion gap and elevated beta hydroxybutyrate.  Patient has elevated lipase although CT without findings of pancreatitis.  Appears lipase has been previously elevated.  Discussed with the hospitalist to admit the patient for further management of endocrine crisis. Patient is on insulin drip.  [WS]    Clinical Course User Index [WS] Lonell Grandchild, MD     Additional history obtained: -Additional history obtained from family -External records from outside source obtained and reviewed including: Chart review including previous notes, labs, imaging, consultation notes including prior ER notes    Lab Tests: -I ordered, reviewed, and interpreted labs.   The pertinent results include:   Labs Reviewed  CBC WITH DIFFERENTIAL/PLATELET - Abnormal; Notable for the  following components:      Result Value   WBC 12.4 (*)    Hemoglobin 12.8 (*)    Lymphs Abs 4.2 (*)    All other components within normal limits  COMPREHENSIVE METABOLIC  PANEL - Abnormal; Notable for the following components:   Sodium 130 (*)    Chloride 96 (*)    CO2 16 (*)    Glucose, Bld >1,200 (*)    BUN 47 (*)    Creatinine, Ser 2.81 (*)    Calcium 10.4 (*)    GFR, Estimated 21 (*)    Anion gap 18 (*)    All other components within normal limits  LIPASE, BLOOD - Abnormal; Notable for the following components:   Lipase 195 (*)    All other components within normal limits  GLUCOSE, CAPILLARY - Abnormal; Notable for the following components:   Glucose-Capillary >600 (*)    All other components within normal limits  MAGNESIUM - Abnormal; Notable for the following components:   Magnesium 2.5 (*)    All other components within normal limits  BETA-HYDROXYBUTYRIC ACID - Abnormal; Notable for the following components:   Beta-Hydroxybutyric Acid 2.83 (*)    All other components within normal limits  OSMOLALITY - Abnormal; Notable for the following components:   Osmolality 333 (*)    All other components within normal limits  BASIC METABOLIC PANEL - Abnormal; Notable for the following components:   Sodium 134 (*)    CO2 18 (*)    Glucose, Bld 554 (*)    BUN 46 (*)    Creatinine, Ser 2.55 (*)    All other components within normal limits  CBG MONITORING, ED - Abnormal; Notable for the following components:   Glucose-Capillary >600 (*)    All other components within normal limits  I-STAT VENOUS BLOOD GAS, ED - Abnormal; Notable for the following components:   pCO2, Ven 27.1 (*)    Bicarbonate 17.1 (*)    TCO2 18 (*)    Acid-base deficit 6.0 (*)    Sodium 130 (*)    All other components within normal limits  CBG MONITORING, ED - Abnormal; Notable for the following components:   Glucose-Capillary 578 (*)    All other components within normal limits  CBG MONITORING, ED - Abnormal; Notable for the following components:   Glucose-Capillary >600 (*)    All other components within normal limits  CBG MONITORING, ED - Abnormal; Notable for the  following components:   Glucose-Capillary 542 (*)    All other components within normal limits  CBG MONITORING, ED - Abnormal; Notable for the following components:   Glucose-Capillary 565 (*)    All other components within normal limits  CBG MONITORING, ED - Abnormal; Notable for the following components:   Glucose-Capillary 534 (*)    All other components within normal limits  CBG MONITORING, ED - Abnormal; Notable for the following components:   Glucose-Capillary 510 (*)    All other components within normal limits  CBG MONITORING, ED - Abnormal; Notable for the following components:   Glucose-Capillary 395 (*)    All other components within normal limits  CBG MONITORING, ED - Abnormal; Notable for the following components:   Glucose-Capillary 407 (*)    All other components within normal limits  URINALYSIS, ROUTINE W REFLEX MICROSCOPIC  BASIC METABOLIC PANEL  BASIC METABOLIC PANEL  BASIC METABOLIC PANEL  BASIC METABOLIC PANEL  BETA-HYDROXYBUTYRIC ACID  BETA-HYDROXYBUTYRIC ACID    Notable for elevated serum osm, hyperglycemia,  anion gap, elevated beta hydroxybutyrate   Imaging Studies ordered: I ordered imaging studies including CT A/P On my interpretation imaging demonstrates no acute process I independently visualized and interpreted imaging. I agree with the radiologist interpretation   Medicines ordered and prescription drug management: Meds ordered this encounter  Medications   lactated ringers bolus 1,500 mL   insulin regular, human (MYXREDLIN) 100 units/ 100 mL infusion    Order Specific Question:   EndoTool low target:    Answer:   140    Order Specific Question:   EndoTool high target:    Answer:   180    Order Specific Question:   Type of Diabetes    Answer:   Type 2    Order Specific Question:   Mode of Therapy    Answer:   [id number] for HHS    Order Specific Question:   Start Method    Answer:   EndoTool to calculate   lactated ringers infusion    DISCONTD: dextrose 5 % in lactated ringers infusion   dextrose 50 % solution 0-50 mL   DISCONTD: lactated ringers bolus 1,588 mL   DISCONTD: insulin regular, human (MYXREDLIN) 100 units/ 100 mL infusion    Order Specific Question:   EndoTool low target:    Answer:   140    Order Specific Question:   EndoTool high target:    Answer:   180    Order Specific Question:   Type of Diabetes    Answer:   Type 2    Order Specific Question:   Mode of Therapy    Answer:   [id number] for DKA    Order Specific Question:   Start Method    Answer:   EndoTool to calculate   DISCONTD: lactated ringers infusion   DISCONTD: dextrose 50 % solution 0-50 mL   potassium chloride 10 mEq in 100 mL IVPB   dextrose 5 % in lactated ringers infusion    -I have reviewed the patients home medicines and have made adjustments as needed   Consultations Obtained: I requested consultation with the hospitalist,  and discussed lab and imaging findings as well as pertinent plan - they recommend: admission   Cardiac Monitoring: The patient was maintained on a cardiac monitor.  I personally viewed and interpreted the cardiac monitored which showed an underlying rhythm of: NSR   Reevaluation: After the interventions noted above, I reevaluated the patient and found that their symptoms have improved  Co morbidities that complicate the patient evaluation  Past Medical History:  Diagnosis Date   Anemia    Arthritis    OCC LEG PAIN   Bladder cancer (HCC) TCC   History of bladder carcinoma; S/P BCG TX'S ; "never treated for cancer; it was a spot on my bladder" (07/20/2018)   CKD (chronic kidney disease), stage III (HCC)    History of gout    "on daily RX" (07/20/2018)   History of kidney stones    Hyperlipidemia    Hypertension    Impaired hearing BILATERAL    OCCASIONAL WEARS HEARING AIDS   Mobitz I    [name] 07/20/2018   Pneumonia  ~ 1980s X 1   Presence of permanent cardiac pacemaker 07/20/2018   Right bundle  branch block    Spondylolisthesis    Type II diabetes mellitus (HCC)       Dispostion: Disposition decision including need for hospitalization was considered, and patient admitted to the hospital.    Final Clinical Impression(s) /  ED Diagnoses Final diagnoses:  Hyperosmolar hyperglycemic state (HHS) (HCC)  Diabetic ketoacidosis without coma associated with type 2 diabetes mellitus (HCC)     This chart was dictated using voice recognition software.  Despite best efforts to proofread,  errors can occur which can change the documentation meaning.    Lonell Grandchild, MD 08/08/23 2107

## 2023-08-08 NOTE — ED Notes (Signed)
MD notified about patients blood sugar

## 2023-08-08 NOTE — ED Triage Notes (Signed)
Pt c/o vomiting since Fri; also told family his blood sugar was 400; pain in bil feet

## 2023-08-08 NOTE — ED Notes (Signed)
Patient transported to CT 

## 2023-08-09 DIAGNOSIS — Z79899 Other long term (current) drug therapy: Secondary | ICD-10-CM | POA: Diagnosis not present

## 2023-08-09 DIAGNOSIS — Z833 Family history of diabetes mellitus: Secondary | ICD-10-CM | POA: Diagnosis not present

## 2023-08-09 DIAGNOSIS — R2689 Other abnormalities of gait and mobility: Secondary | ICD-10-CM | POA: Diagnosis not present

## 2023-08-09 DIAGNOSIS — E785 Hyperlipidemia, unspecified: Secondary | ICD-10-CM | POA: Diagnosis present

## 2023-08-09 DIAGNOSIS — I10 Essential (primary) hypertension: Secondary | ICD-10-CM

## 2023-08-09 DIAGNOSIS — I441 Atrioventricular block, second degree: Secondary | ICD-10-CM | POA: Diagnosis present

## 2023-08-09 DIAGNOSIS — R296 Repeated falls: Secondary | ICD-10-CM | POA: Diagnosis present

## 2023-08-09 DIAGNOSIS — G8929 Other chronic pain: Secondary | ICD-10-CM | POA: Diagnosis present

## 2023-08-09 DIAGNOSIS — Z794 Long term (current) use of insulin: Secondary | ICD-10-CM | POA: Diagnosis not present

## 2023-08-09 DIAGNOSIS — Z7982 Long term (current) use of aspirin: Secondary | ICD-10-CM | POA: Diagnosis not present

## 2023-08-09 DIAGNOSIS — M1990 Unspecified osteoarthritis, unspecified site: Secondary | ICD-10-CM | POA: Diagnosis present

## 2023-08-09 DIAGNOSIS — E1100 Type 2 diabetes mellitus with hyperosmolarity without nonketotic hyperglycemic-hyperosmolar coma (NKHHC): Secondary | ICD-10-CM | POA: Diagnosis present

## 2023-08-09 DIAGNOSIS — E1110 Type 2 diabetes mellitus with ketoacidosis without coma: Secondary | ICD-10-CM | POA: Diagnosis present

## 2023-08-09 DIAGNOSIS — N184 Chronic kidney disease, stage 4 (severe): Secondary | ICD-10-CM | POA: Diagnosis present

## 2023-08-09 DIAGNOSIS — D72829 Elevated white blood cell count, unspecified: Secondary | ICD-10-CM | POA: Diagnosis present

## 2023-08-09 DIAGNOSIS — E1122 Type 2 diabetes mellitus with diabetic chronic kidney disease: Secondary | ICD-10-CM | POA: Diagnosis present

## 2023-08-09 DIAGNOSIS — D631 Anemia in chronic kidney disease: Secondary | ICD-10-CM | POA: Diagnosis present

## 2023-08-09 DIAGNOSIS — Z87891 Personal history of nicotine dependence: Secondary | ICD-10-CM | POA: Diagnosis not present

## 2023-08-09 DIAGNOSIS — Z950 Presence of cardiac pacemaker: Secondary | ICD-10-CM | POA: Diagnosis not present

## 2023-08-09 DIAGNOSIS — E860 Dehydration: Secondary | ICD-10-CM | POA: Diagnosis present

## 2023-08-09 DIAGNOSIS — M4310 Spondylolisthesis, site unspecified: Secondary | ICD-10-CM | POA: Diagnosis present

## 2023-08-09 DIAGNOSIS — I129 Hypertensive chronic kidney disease with stage 1 through stage 4 chronic kidney disease, or unspecified chronic kidney disease: Secondary | ICD-10-CM | POA: Diagnosis present

## 2023-08-09 DIAGNOSIS — Z8551 Personal history of malignant neoplasm of bladder: Secondary | ICD-10-CM | POA: Diagnosis not present

## 2023-08-09 DIAGNOSIS — I4510 Unspecified right bundle-branch block: Secondary | ICD-10-CM | POA: Diagnosis present

## 2023-08-09 DIAGNOSIS — H9190 Unspecified hearing loss, unspecified ear: Secondary | ICD-10-CM | POA: Diagnosis present

## 2023-08-09 DIAGNOSIS — N179 Acute kidney failure, unspecified: Secondary | ICD-10-CM | POA: Diagnosis present

## 2023-08-09 DIAGNOSIS — Z800 Family history of malignant neoplasm of digestive organs: Secondary | ICD-10-CM | POA: Diagnosis not present

## 2023-08-09 LAB — GLUCOSE, CAPILLARY
Glucose-Capillary: 127 mg/dL — ABNORMAL HIGH (ref 70–99)
Glucose-Capillary: 163 mg/dL — ABNORMAL HIGH (ref 70–99)
Glucose-Capillary: 169 mg/dL — ABNORMAL HIGH (ref 70–99)
Glucose-Capillary: 173 mg/dL — ABNORMAL HIGH (ref 70–99)
Glucose-Capillary: 177 mg/dL — ABNORMAL HIGH (ref 70–99)
Glucose-Capillary: 185 mg/dL — ABNORMAL HIGH (ref 70–99)
Glucose-Capillary: 195 mg/dL — ABNORMAL HIGH (ref 70–99)
Glucose-Capillary: 195 mg/dL — ABNORMAL HIGH (ref 70–99)
Glucose-Capillary: 197 mg/dL — ABNORMAL HIGH (ref 70–99)
Glucose-Capillary: 218 mg/dL — ABNORMAL HIGH (ref 70–99)

## 2023-08-09 LAB — BASIC METABOLIC PANEL
Anion gap: 10 (ref 5–15)
Anion gap: 11 (ref 5–15)
BUN: 41 mg/dL — ABNORMAL HIGH (ref 8–23)
BUN: 38 mg/dL — ABNORMAL HIGH (ref 8–23)
CO2: 19 mmol/L — ABNORMAL LOW (ref 22–32)
CO2: 22 mmol/L (ref 22–32)
Calcium: 10.6 mg/dL — ABNORMAL HIGH (ref 8.9–10.3)
Calcium: 10.3 mg/dL (ref 8.9–10.3)
Chloride: 110 mmol/L (ref 98–111)
Chloride: 106 mmol/L (ref 98–111)
Creatinine, Ser: 2.27 mg/dL — ABNORMAL HIGH (ref 0.61–1.24)
Creatinine, Ser: 2.01 mg/dL — ABNORMAL HIGH (ref 0.61–1.24)
GFR, Estimated: 27 mL/min — ABNORMAL LOW (ref >60)
GFR, Estimated: 32 mL/min — ABNORMAL LOW (ref >60)
Glucose, Bld: 169 mg/dL — ABNORMAL HIGH (ref 70–99)
Glucose, Bld: 199 mg/dL — ABNORMAL HIGH (ref 70–99)
Potassium: 4.2 mmol/L (ref 3.5–5.1)
Potassium: 4 mmol/L (ref 3.5–5.1)
Sodium: 139 mmol/L (ref 135–145)
Sodium: 139 mmol/L (ref 135–145)

## 2023-08-09 LAB — CBC
HCT: 35.5 % — ABNORMAL LOW (ref 39.0–52.0)
Hemoglobin: 11.3 g/dL — ABNORMAL LOW (ref 13.0–17.0)
MCH: 27.9 pg (ref 26.0–34.0)
MCHC: 31.8 g/dL (ref 30.0–36.0)
MCV: 87.7 fL (ref 80.0–100.0)
Platelets: 142 10*3/uL — ABNORMAL LOW (ref 150–400)
RBC: 4.05 MIL/uL — ABNORMAL LOW (ref 4.22–5.81)
RDW: 15 % (ref 11.5–15.5)
WBC: 9.9 10*3/uL (ref 4.0–10.5)
nRBC: 0 % (ref 0.0–0.2)

## 2023-08-09 LAB — MRSA NEXT GEN BY PCR, NASAL: MRSA by PCR Next Gen: NOT DETECTED

## 2023-08-09 MED ORDER — INSULIN ASPART 100 UNIT/ML IJ SOLN
3.0000 [IU] | Freq: Three times a day (TID) | INTRAMUSCULAR | Status: DC
  Administered 2023-08-09 (×2): 3 [IU] via SUBCUTANEOUS

## 2023-08-09 MED ORDER — PROCHLORPERAZINE EDISYLATE 10 MG/2ML IJ SOLN
10.0000 mg | Freq: Four times a day (QID) | INTRAMUSCULAR | Status: DC | PRN
  Administered 2023-08-09: 10 mg via INTRAVENOUS
  Filled 2023-08-09: qty 2

## 2023-08-09 MED ORDER — ENOXAPARIN SODIUM 30 MG/0.3ML IJ SOSY
30.0000 mg | PREFILLED_SYRINGE | INTRAMUSCULAR | Status: DC
  Administered 2023-08-09 – 2023-08-11 (×3): 30 mg via SUBCUTANEOUS
  Filled 2023-08-09 (×3): qty 0.3

## 2023-08-09 MED ORDER — AMLODIPINE BESYLATE 10 MG PO TABS
10.0000 mg | ORAL_TABLET | Freq: Every day | ORAL | Status: DC
  Administered 2023-08-09 – 2023-08-11 (×3): 10 mg via ORAL
  Filled 2023-08-09 (×3): qty 1

## 2023-08-09 MED ORDER — ENOXAPARIN SODIUM 40 MG/0.4ML IJ SOSY: 40.0000 mg | PREFILLED_SYRINGE | INTRAMUSCULAR | Status: DC

## 2023-08-09 MED ORDER — SODIUM BICARBONATE 650 MG PO TABS
1300.0000 mg | ORAL_TABLET | Freq: Two times a day (BID) | ORAL | Status: DC
  Administered 2023-08-09 – 2023-08-11 (×6): 1300 mg via ORAL
  Filled 2023-08-09 (×6): qty 2

## 2023-08-09 MED ORDER — INSULIN ASPART 100 UNIT/ML IJ SOLN: 0.0000 [IU] | Freq: Every day | INTRAMUSCULAR | Status: DC

## 2023-08-09 MED ORDER — ONDANSETRON HCL 4 MG/2ML IJ SOLN
4.0000 mg | Freq: Four times a day (QID) | INTRAMUSCULAR | Status: DC | PRN
  Administered 2023-08-09: 4 mg via INTRAVENOUS
  Filled 2023-08-09: qty 2

## 2023-08-09 MED ORDER — INSULIN GLARGINE-YFGN 100 UNIT/ML ~~LOC~~ SOLN
10.0000 [IU] | SUBCUTANEOUS | Status: DC
  Administered 2023-08-09 – 2023-08-10 (×2): 10 [IU] via SUBCUTANEOUS
  Filled 2023-08-09 (×2): qty 0.1

## 2023-08-09 MED ORDER — ACETAMINOPHEN 650 MG RE SUPP: 650.0000 mg | Freq: Four times a day (QID) | RECTAL | Status: DC | PRN

## 2023-08-09 MED ORDER — ACETAMINOPHEN 325 MG PO TABS: 650.0000 mg | ORAL_TABLET | Freq: Four times a day (QID) | ORAL | Status: DC | PRN

## 2023-08-09 MED ORDER — INSULIN ASPART 100 UNIT/ML IJ SOLN
0.0000 [IU] | Freq: Three times a day (TID) | INTRAMUSCULAR | Status: DC
  Administered 2023-08-09 (×2): 2 [IU] via SUBCUTANEOUS
  Administered 2023-08-10: 1 [IU] via SUBCUTANEOUS
  Administered 2023-08-10: 3 [IU] via SUBCUTANEOUS
  Administered 2023-08-10: 2 [IU] via SUBCUTANEOUS
  Administered 2023-08-11: 1 [IU] via SUBCUTANEOUS

## 2023-08-09 MED ORDER — LACTATED RINGERS IV SOLN: INTRAVENOUS | Status: AC

## 2023-08-09 MED ORDER — ONDANSETRON HCL 4 MG PO TABS: 4.0000 mg | ORAL_TABLET | Freq: Four times a day (QID) | ORAL | Status: DC | PRN

## 2023-08-09 MED ORDER — ORAL CARE MOUTH RINSE: 15.0000 mL | OROMUCOSAL | Status: DC | PRN

## 2023-08-09 MED ORDER — CHLORHEXIDINE GLUCONATE CLOTH 2 % EX PADS: 6.0000 | MEDICATED_PAD | Freq: Every day | CUTANEOUS | Status: DC

## 2023-08-09 NOTE — Progress Notes (Signed)
Report Called. Pt notified of tranfer. Pt stable at this time

## 2023-08-09 NOTE — Assessment & Plan Note (Addendum)
DM2 with HHS /  DKA BHB was 2.8 initially, less than 1 on repeat.   Looks like BGL normalized after insulin gtt No AG on latest BMP Will put in transition orders to [facility] insulin: Lantus 10u Q24H Novolog 3u mealtime Novolog sensitive SSI AC/HS

## 2023-08-09 NOTE — Assessment & Plan Note (Addendum)
Renal fxn today actually looks pretty close to recent baseline. Cont bicarb for RTA

## 2023-08-09 NOTE — Progress Notes (Signed)
Pt transitioned off insulin drip at about 0420. 10 units Semglee SQ given at 0220. CBG right after turning off insulin drip is 173 mg/dL. No correction scale ordered and will cover CBG with breakfast per Dr. Sharrell Ku. Will continue to monitor pt.

## 2023-08-09 NOTE — Inpatient Diabetes Management (Signed)
Inpatient Diabetes Program Recommendations  AACE/ADA: New Consensus Statement on Inpatient Glycemic Control (2015)  Target Ranges:  Prepandial:   less than 140 mg/dL      Peak postprandial:   less than 180 mg/dL (1-2 hours)      Critically ill patients:  140 - 180 mg/dL   Lab Results  Component Value Date   GLUCAP 197 (H) 08/09/2023   HGBA1C 9.1 (H) 07/19/2023    Review of Glycemic Control  Latest Reference Range & Units 08/09/23 03:12 08/09/23 04:20 08/09/23 05:18 08/09/23 07:34 08/09/23 12:06  Glucose-Capillary 70 - 99 mg/dL 086 (H) 578 (H) 469 (H) 163 (H) 197 (H)   Diabetes history: DM 2 Outpatient Diabetes medications:  Tresiba 30 units daily Humalog 6 units bid  Current orders for Inpatient glycemic control:  Semglee 10 units daily Novolog 0-9 units tid with meals Novolog 3 units tid with meals   Inpatient Diabetes Program Recommendations:    Spoke with patient and his son at bedside.  Per patient he was taking his insulin but told me several different doses/types during our conversation.  Son states that he went to Dr. Laurey Morale office last week and insulins were changed (he was previously on 70/30-40 units q AM and 30 units q PM).   Patient verified that he was on a "green" pen and was supposed to take a dose of that one (unsure of dose) and the blue pen 6 units two times daily.  He states that he was taking it as directed.  He lives alone.  Son states they attempted to get him to stay with them, but he is very independent.   -Called and spoke to [employer] in [place] and they verified new insulin orders including Tresiba 30 units daily and Novolog 6 units bid.  May benefit from more assistance at home with insulin and checking blood sugars.   Will follow.  Likely will need increase of Semglee based on home dose of basal insulin.   Thanks,  Beryl Meager, RN, BC-ADM Inpatient Diabetes Coordinator Pager [phone]   (8a-5p)

## 2023-08-09 NOTE — Assessment & Plan Note (Addendum)
Cont amlodipine

## 2023-08-09 NOTE — H&P (Signed)
History and Physical    Patient: Keith Drake DGU:[id number] DOB: 1935/10/25 DOA: 08/08/2023 DOS: the patient was seen and examined on 08/09/2023 PCP: Rodrigo Ran, MD  Patient coming from: Home  Chief Complaint:  Chief Complaint  Patient presents with   Emesis   HPI: Keith Drake is a 88 y.o. male with medical history significant of anemia, bladder cancer, osteoarthritis, stage IV CKD, nephrolithiasis, hyperlipidemia, hypertension, RBBB, Mobitz 1 heart block, pacemaker placement, spondylolisthesis and type 2 diabetes presented to emergency department for complaining about confusion, weakness and vomiting.   Patient and family report weakness over the past 2 to 3 days associated with vomiting. Today patient's blood sugar is elevated. Family thinks he is taking his insulin and medications as prescribed. Patient reports some mild diffuse abdominal pain with it. No fevers, chills. No chest pain or shortness of breath. No leg swelling. No urinary symptoms. No diarrhea.    At presentation to ED patient was hemodynamically stable however blood pressure continued to trend up to 176/84.   CBC showing leukocytosis 12.4 and stable H&H 12.8 and 39.   CMP showing sodium 130, potassium 4.7, chloride 96, bicarb 16, elevated blood glucose 1200, BUN 47, creatinine 2.81, calcium 10.4, GFR 21 and elevated anion gap 18. Elevated beta-hydroxybutyrate 2.83. Been BG 7.4, pCO2 27, pO2 38, bicarb 17. Elevated lipase 195.   CT abdomen finding: IMPRESSION: 1. Near complete resolution of previously seen peripancreatic inflammatory changes. No other findings to explain the patient's symptoms.   Chest x-ray no acute cardiopulmonary disease.   With the concern for development of HHS versus early DKA patient being started on insulin drip with with protocol.   Hospitalist has been contacted for further evaluation management of DKA versus HHS and chronic pancreatitis.  Review of Systems: As mentioned in the  history of present illness. All other systems reviewed and are negative. Past Medical History:  Diagnosis Date   Anemia    Arthritis    OCC LEG PAIN   Bladder cancer (HCC) TCC   History of bladder carcinoma; S/P BCG TX'S ; "never treated for cancer; it was a spot on my bladder" (07/20/2018)   CKD (chronic kidney disease), stage III (HCC)    History of gout    "on daily RX" (07/20/2018)   History of kidney stones    Hyperlipidemia    Hypertension    Impaired hearing BILATERAL    OCCASIONAL WEARS HEARING AIDS   Mobitz I    [name] 07/20/2018   Pneumonia  ~ 1980s X 1   Presence of permanent cardiac pacemaker 07/20/2018   Right bundle branch block    Spondylolisthesis    Type II diabetes mellitus (HCC)    Past Surgical History:  Procedure Laterality Date   COLONOSCOPY  03/2004   CYSTO / RESECTIONAL BLADDER BX'S  05-01-2011   CYSTOSCOPY WITH BIOPSY  10/05/2011   Procedure: CYSTOSCOPY WITH BIOPSY;  Surgeon: Garnett Farm, MD;  Location: [facility];  Service: Urology;  Laterality: N/A;  BLADDER BIOPSY    CYSTOSCOPY WITH LITHOLAPAXY     EXCISIONAL HEMORRHOIDECTOMY  1980s   INSERT / REPLACE / REMOVE PACEMAKER  07/20/2018   Implantation of new dual chamber permanent pacemaker   PACEMAKER IMPLANT N/A 07/20/2018   Procedure: PACEMAKER IMPLANT;  Surgeon: Thurmon Fair, MD;  Location: [facility] IN[facility]ASIVE [facility];  Service: Cardiovascular;  Laterality: N/A;   REFRACTIVE SURGERY Bilateral    TRANSURETHRAL RESECTION OF BLADDER TUMOR  03-02-2011   US ECHOCARDIOGRAPHY  07-11-2009  EF 55-60%   Social History:  reports that he has quit smoking. His smoking use included cigars. He has never used smokeless tobacco. He reports that he does not currently use alcohol. He reports that he does not use drugs.  Allergies  Allergen Reactions   Fosinopril Cough   Fenofibrate Rash    Family History  Problem Relation Age of Onset   Colon cancer Brother    Cancer Brother    Cancer Brother     Diabetes Sister     Prior to Admission medications   Medication Sig Start Date End Date Taking? Authorizing Provider  acetaminophen (TYLENOL) 500 MG tablet Take 1,000 mg by mouth every 6 (six) hours as needed for mild pain.    [provider]  allopurinol (ZYLOPRIM) 100 MG tablet Take 100 mg by mouth daily. 07/07/18   [provider]  amLODipine (NORVASC) 10 MG tablet Take 10 mg by mouth daily.  12/09/15   [provider]  aspirin 81 MG tablet Take 81 mg by mouth daily.    [provider]  cyanocobalamin 1000 MCG tablet Take 1 tablet (1,000 mcg total) by mouth daily. 07/21/23   Arnetha Courser, MD  dicyclomine (BENTYL) 20 MG tablet Take 1 tablet (20 mg total) by mouth 2 (two) times daily for 14 days. 06/29/23 07/19/23  Maxwell Marion, PA-C  esomeprazole (NEXIUM) 20 MG capsule Take 1 capsule (20 mg total) by mouth daily. 07/21/23 07/20/24  Arnetha Courser, MD  Fe Fum-Vit C-Vit B12-FA (TRIGELS-F FORTE) CAPS capsule Take 1 capsule by mouth 2 (two) times daily. 07/21/23   Arnetha Courser, MD  HUMULIN 70/30 (70-30) 100 UNIT/ML injection Inject 15-40 Units into the skin daily. Inject 40 units with breakfast and 15 units with supper. 12/24/15   [provider]  ondansetron (ZOFRAN) 4 MG tablet Take 1 tablet (4 mg total) by mouth every 6 (six) hours as needed for nausea. 07/21/23   Arnetha Courser, MD  sodium bicarbonate 650 MG tablet Take 1,300 mg by mouth 2 (two) times daily. 06/03/23   [provider]    Physical Exam: Vitals:   08/08/23 2100 08/08/23 2155 08/08/23 2200 08/09/23 0009  BP: (!) 147/82  (!) 140/72 (!) 162/77  Pulse: 65  70 70  Resp: 15  18 14   Temp:  98 F (36.7 C)  98.1 F (36.7 C)  TempSrc:  Oral  Oral  SpO2: 100%  100% 100%  Weight:    73.6 kg  Height:    5\' 10"  (1.778 m)   Constitutional: NAD, calm, comfortable, Hard of hearing Respiratory: clear to auscultation bilaterally, no wheezing, no crackles. Normal respiratory effort. No accessory  muscle use.  Cardiovascular: Regular rate and rhythm, no murmurs / rubs / gallops. No extremity edema. 2+ pedal pulses. No carotid bruits.  Abdomen: no tenderness, no masses palpated. No hepatosplenomegaly. Bowel sounds positive.  Neurologic: CN 2-12 grossly intact. Sensation intact, DTR normal. Strength 5/5 in all 4.  Psychiatric: Normal judgment and insight. Alert and oriented x 3. Normal mood.   Data Reviewed:    Labs on Admission: I have personally reviewed following labs and imaging studies  CBC: Recent Labs  Lab 08/08/23 1607 08/08/23 1640  WBC 12.4*  --   NEUTROABS 7.2  --   HGB 12.8* 14.3  HCT 39.2 42.0  MCV 84.3  --   PLT 333  --    Basic Metabolic Panel: Recent Labs  Lab 08/08/23 1607 08/08/23 1626 08/08/23 1640 08/08/23 1917 08/09/23 0029  NA 130*  --  130* 134* 139  K 4.7  --  4.8 4.9 4.2  CL 96*  --   --  101 110  CO2 16*  --   --  18* 19*  GLUCOSE >1,200*  --   --  554* 169*  BUN 47*  --   --  46* 41*  CREATININE 2.81*  --   --  2.55* 2.27*  CALCIUM 10.4*  --   --  10.2 10.6*  MG  --  2.5*  --   --   --    GFR: Estimated Creatinine Clearance: 24.1 mL/min (A) (by C-G formula based on SCr of 2.27 mg/dL (H)). Liver Function Tests: Recent Labs  Lab 08/08/23 1607  AST 31  ALT 34  ALKPHOS 99  BILITOT 1.0  PROT 7.6  ALBUMIN 3.9   Recent Labs  Lab 08/08/23 1607  LIPASE 195*   No results for input(s): "AMMONIA" in the last 168 hours. Coagulation Profile: No results for input(s): "INR", "PROTIME" in the last 168 hours. Cardiac Enzymes: No results for input(s): "CKTOTAL", "CKMB", "CKMBINDEX", "TROPONINI" in the last 168 hours. BNP (last 3 results) No results for input(s): "PROBNP" in the last 8760 hours. HbA1C: No results for input(s): "HGBA1C" in the last 72 hours. CBG: Recent Labs  Lab 08/08/23 2101 08/08/23 2136 08/08/23 2248 08/09/23 0009 08/09/23 0108  GLUCAP 407* 321* 154* 185* 177*   Lipid Profile: No results for input(s):  "CHOL", "HDL", "LDLCALC", "TRIG", "CHOLHDL", "LDLDIRECT" in the last 72 hours. Thyroid Function Tests: No results for input(s): "TSH", "T4TOTAL", "FREET4", "T3FREE", "THYROIDAB" in the last 72 hours. Anemia Panel: No results for input(s): "VITAMINB12", "FOLATE", "FERRITIN", "TIBC", "IRON", "RETICCTPCT" in the last 72 hours. Urine analysis:    Component Value Date/Time   COLORURINE YELLOW 08/08/2023 1713   APPEARANCEUR CLEAR 08/08/2023 1713   LABSPEC 1.015 08/08/2023 1713   PHURINE 6.0 08/08/2023 1713   GLUCOSEU >=500 (A) 08/08/2023 1713   HGBUR TRACE (A) 08/08/2023 1713   BILIRUBINUR NEGATIVE 08/08/2023 1713   KETONESUR NEGATIVE 08/08/2023 1713   PROTEINUR 100 (A) 08/08/2023 1713   NITRITE NEGATIVE 08/08/2023 1713   LEUKOCYTESUR NEGATIVE 08/08/2023 1713    Radiological Exams on Admission: CT ABDOMEN PELVIS WO CONTRAST  Result Date: 08/08/2023 CLINICAL DATA:  Abdominal pain. EXAM: CT ABDOMEN AND PELVIS WITHOUT CONTRAST TECHNIQUE: Multidetector CT imaging of the abdomen and pelvis was performed following the standard protocol without IV contrast. RADIATION DOSE REDUCTION: This exam was performed according to the departmental dose-optimization program which includes automated exposure control, adjustment of the mA and/or kV according to patient size and/or use of iterative reconstruction technique. COMPARISON:  CT abdomen pelvis dated 07/19/2023. FINDINGS: Lower chest: Mild bibasilar atelectasis. Hepatobiliary: Multiple hepatic cysts are noted with the largest measuring 1.3 cm. No imaging follow-up is recommended. No gallstones, gallbladder wall thickening, or biliary dilatation. Pancreas: Previously seen peripancreatic inflammatory changes have nearly completely resolved. No pancreatic ductal dilatation or surrounding inflammatory changes. Spleen: Normal in size without focal abnormality. Adrenals/Urinary Tract: Adrenal glands are unremarkable. Bilateral renal cysts measure up to 1.6 cm. A  nonobstructive left renal calculus measures 2 mm. No renal calculi on the right. No hydronephrosis on either side. A few bladder diverticuli are seen posteriorly. Stomach/Bowel: Stomach is within normal limits. The appendix is likely absent. No evidence of bowel wall thickening, distention, or inflammatory changes. Vascular/Lymphatic: Aortic atherosclerosis. No enlarged abdominal or pelvic lymph nodes. Reproductive: Prostate is unremarkable. Other: No abdominal wall hernia or abnormality. No  abdominopelvic ascites. Musculoskeletal: Fixation hardware at L4-L5 is redemonstrated. Degenerative changes are seen in the spine, most significant at L3-L4. IMPRESSION: 1. Near complete resolution of previously seen peripancreatic inflammatory changes. No other findings to explain the patient's symptoms. Aortic Atherosclerosis (ICD10-I70.0). Electronically Signed   By: Romona Curls M.D.   On: 08/08/2023 18:50   DG Chest 2 View  Result Date: 08/08/2023 CLINICAL DATA:  Emesis for 3 days. Hyperglycemia. Question pneumonia. EXAM: CHEST - 2 VIEW COMPARISON:  One-view chest x-ray 07/19/2023 FINDINGS: The heart size is normal. Pacing wires are stable. The lungs are clear. The visualized soft tissues and bony thorax are unremarkable. IMPRESSION: No acute cardiopulmonary disease. Electronically Signed   By: Marin Roberts M.D.   On: 08/08/2023 17:14    EKG: Independently reviewed.   Assessment and Plan: * Hyperosmolar hyperglycemic state (HHS) (HCC) DM2 with HHS /  DKA BHB was 2.8 initially, less than 1 on repeat.   Looks like BGL normalized after insulin gtt No AG on latest BMP Will put in transition orders to [facility] insulin: Lantus 10u Q24H Novolog 3u mealtime Novolog sensitive SSI AC/HS  CKD (chronic kidney disease), stage IV (HCC) Renal fxn today actually looks pretty close to recent baseline. Cont bicarb for RTA  Essential hypertension Cont amlodipine      Advance Care Planning:   Code Status: Full  Code  Consults: None  Family Communication: No family in room  Severity of Illness: The appropriate patient status for this patient is INPATIENT. Inpatient status is judged to be reasonable and necessary in order to provide the required intensity of service to ensure the patient's safety. The patient's presenting symptoms, physical exam findings, and initial radiographic and laboratory data in the context of their chronic comorbidities is felt to place them at high risk for further clinical deterioration. Furthermore, it is not anticipated that the patient will be medically stable for discharge from the hospital within 2 midnights of admission.   * I certify that at the point of admission it is my clinical judgment that the patient will require inpatient hospital care spanning beyond 2 midnights from the point of admission due to high intensity of service, high risk for further deterioration and high frequency of surveillance required.*  Author: Hillary Bow., DO 08/09/2023 1:18 AM  For on call review [email or web address].

## 2023-08-09 NOTE — Plan of Care (Signed)

## 2023-08-09 NOTE — Progress Notes (Signed)
[facility]                                                                              Keith Drake, is a 88 y.o. male, DOB - May 01, 1935, BMW:[id number] Admit date - 08/08/2023    Outpatient Primary MD for the patient is Rodrigo Ran, MD  LOS - 1  days  Chief Complaint  Patient presents with   Emesis       Brief summary   Patient is a 88 year old male with anemia, history of bladder CA, osteoarthritis, CKD stage IV, HTN, HLP, RBBB, Mobitz type I heart block, pacemaker, DM type II presented with confusion, generalized weakness and vomiting.  Patient reported weakness in the last 2 to 3 days prior to admission with vomiting.  On the day of admission blood sugars were elevated patient also reported some mild diffuse abdominal pain, no fevers or chills, no chest pain or shortness of breath. In ED, sodium 130, bicarb 16, elevated blood glucose >1200, BUN 47 creatinine 2.81 elevated anion gap 18 CT abdomen showed near complete resolution of previously seen peripancreatic inflammatory changes Patient was admitted for early DKA/ HONK and started on insulin drip.  Assessment & Plan    Principal Problem:   Hyperosmolar hyperglycemic state (HHS) (HCC) in the setting of uncontrolled DM type 2 -Patient was placed on IV fluid hydration, IV insulin drip.  Elevated CBG> 1200, creatinine 2.81, anion gap metabolic acidosis, osmolality 272 -Gap closed, CO2 22, CBGs have been improving, patient was transitioned to subcutaneous insulin earlier this morning. -Hemoglobin A1c 9.1 on 07/19/2023 -Continue Semglee 10 units daily, NovoLog 3 units 3 times daily AC, sliding scale insulin, sensitive CBG (last 3)  Recent Labs    08/09/23 0518 08/09/23 0734 08/09/23 1206  GLUCAP 169* 163* 197*   -Diabetic coordinator consult placed  Active Problems: Acute kidney injury on CKD stage IV -Presented with creatinine of 2.81, baseline creatinine 2.0-2.3 -Likely precipitated due to  #1 -Creatinine improving, received IV fluid hydration, 2.0 -Continue bicarb for RTA    Essential hypertension -Stable, continue amlodipine    Anemia in chronic renal disease -Hemoglobin 14.3 on admission likely due to hemoconcentration, now 11.3 -Hemoglobin was 11.4 on 07/19/2023   Generalized debility, falls at home -PT OT evaluation  History of complete heart block -Status post pacemaker in 2019, follows cardiology outpatient, Dr. Royann Shivers  Chronic back pain -Currently stable  Estimated body mass index is 23.28 kg/m as calculated from the following:   Height as of this encounter: 5\' 10"  (1.778 m).   Weight as of this encounter: 73.6 kg.  Code Status: Full code DVT Prophylaxis:  enoxaparin (LOVENOX) injection 30 mg Start: 08/09/23 1000   Level of Care: Level of care: Telemetry Family Communication: Updated patient Disposition Plan:      Remains inpatient appropriate: Transfer to the floor, hopefully DC home tomorrow   Procedures:  None  Consultants:   None  Antimicrobials:   Anti-infectives (From admission, onward)    None          Medications  amLODipine  10 mg Oral Daily   Chlorhexidine Gluconate Cloth  6 each Topical Daily  enoxaparin (LOVENOX) injection  30 mg Subcutaneous Q24H   insulin aspart  0-5 Units Subcutaneous QHS   insulin aspart  0-9 Units Subcutaneous TID WC   insulin aspart  3 Units Subcutaneous TID WC   insulin glargine-yfgn  10 Units Subcutaneous Q24H   sodium bicarbonate  1,300 mg Oral BID      Subjective:   Keith Drake was seen and examined today.  Transitioned to subcu insulin this morning.  Hearing deficit otherwise fairly alert and oriented.  Still having some nausea but no acute abdominal pain, no chest pain, no shortness of breath, any focal weakness.  No fevers.  Objective:   Vitals:   08/09/23 1000 08/09/23 1026 08/09/23 1100 08/09/23 1200  BP: (!) 110/59 135/68 131/66 (!) 148/76  Pulse:      Resp: 18  18 17    Temp:    98.1 F (36.7 C)  TempSrc:    Oral  SpO2:      Weight:      Height:        Intake/Output Summary (Last 24 hours) at 08/09/2023 1235 Last data filed at 08/09/2023 0900 Gross per 24 hour  Intake 3021.12 ml  Output 450 ml  Net 2571.12 ml     Wt Readings from Last 3 Encounters:  08/09/23 73.6 kg  07/19/23 79.4 kg  06/29/23 79.4 kg     Exam General: Alert and oriented NAD, hearing deficit Cardiovascular: S1 S2 auscultated,  RRR Respiratory: Clear to auscultation bilaterally, no wheezing Gastrointestinal: Soft, nontender, nondistended, + bowel sounds Ext: no pedal edema bilaterally Neuro: Strength 5/5 upper and lower extremities bilaterally Psych: Normal affect     Data Reviewed:  I have personally reviewed following labs    CBC Lab Results  Component Value Date   WBC 9.9 08/09/2023   RBC 4.05 (L) 08/09/2023   HGB 11.3 (L) 08/09/2023   HCT 35.5 (L) 08/09/2023   MCV 87.7 08/09/2023   MCH 27.9 08/09/2023   PLT 142 (L) 08/09/2023   MCHC 31.8 08/09/2023   RDW 15.0 08/09/2023   LYMPHSABS 4.2 (H) 08/08/2023   MONOABS 0.9 08/08/2023   EOSABS 0.0 08/08/2023   BASOSABS 0.1 08/08/2023     Last metabolic panel Lab Results  Component Value Date   NA 139 08/09/2023   K 4.0 08/09/2023   CL 106 08/09/2023   CO2 22 08/09/2023   BUN 38 (H) 08/09/2023   CREATININE 2.01 (H) 08/09/2023   GLUCOSE 199 (H) 08/09/2023   GFRNONAA 32 (L) 08/09/2023   GFRAA 29 (L) 08/03/2018   CALCIUM 10.3 08/09/2023   PROT 7.6 08/08/2023   ALBUMIN 3.9 08/08/2023   BILITOT 1.0 08/08/2023   ALKPHOS 99 08/08/2023   AST 31 08/08/2023   ALT 34 08/08/2023   ANIONGAP 11 08/09/2023    CBG (last 3)  Recent Labs    08/09/23 0518 08/09/23 0734 08/09/23 1206  GLUCAP 169* 163* 197*      Coagulation Profile: No results for input(s): "INR", "PROTIME" in the last 168 hours.   Radiology Studies: I have personally reviewed the imaging studies  CT ABDOMEN PELVIS WO  CONTRAST  Result Date: 08/08/2023 CLINICAL DATA:  Abdominal pain. EXAM: CT ABDOMEN AND PELVIS WITHOUT CONTRAST TECHNIQUE: Multidetector CT imaging of the abdomen and pelvis was performed following the standard protocol without IV contrast. RADIATION DOSE REDUCTION: This exam was performed according to the departmental dose-optimization program which includes automated exposure control, adjustment of the mA and/or kV according to patient size and/or use of  iterative reconstruction technique. COMPARISON:  CT abdomen pelvis dated 07/19/2023. FINDINGS: Lower chest: Mild bibasilar atelectasis. Hepatobiliary: Multiple hepatic cysts are noted with the largest measuring 1.3 cm. No imaging follow-up is recommended. No gallstones, gallbladder wall thickening, or biliary dilatation. Pancreas: Previously seen peripancreatic inflammatory changes have nearly completely resolved. No pancreatic ductal dilatation or surrounding inflammatory changes. Spleen: Normal in size without focal abnormality. Adrenals/Urinary Tract: Adrenal glands are unremarkable. Bilateral renal cysts measure up to 1.6 cm. A nonobstructive left renal calculus measures 2 mm. No renal calculi on the right. No hydronephrosis on either side. A few bladder diverticuli are seen posteriorly. Stomach/Bowel: Stomach is within normal limits. The appendix is likely absent. No evidence of bowel wall thickening, distention, or inflammatory changes. Vascular/Lymphatic: Aortic atherosclerosis. No enlarged abdominal or pelvic lymph nodes. Reproductive: Prostate is unremarkable. Other: No abdominal wall hernia or abnormality. No abdominopelvic ascites. Musculoskeletal: Fixation hardware at L4-L5 is redemonstrated. Degenerative changes are seen in the spine, most significant at L3-L4. IMPRESSION: 1. Near complete resolution of previously seen peripancreatic inflammatory changes. No other findings to explain the patient's symptoms. Aortic Atherosclerosis (ICD10-I70.0).  Electronically Signed   By: Romona Curls M.D.   On: 08/08/2023 18:50   DG Chest 2 View  Result Date: 08/08/2023 CLINICAL DATA:  Emesis for 3 days. Hyperglycemia. Question pneumonia. EXAM: CHEST - 2 VIEW COMPARISON:  One-view chest x-ray 07/19/2023 FINDINGS: The heart size is normal. Pacing wires are stable. The lungs are clear. The visualized soft tissues and bony thorax are unremarkable. IMPRESSION: No acute cardiopulmonary disease. Electronically Signed   By: Marin Roberts M.D.   On: 08/08/2023 17:14       Keith Drake M.D. [facility] 08/09/2023, 12:35 PM  Available via Epic secure chat 7am-7pm After 7 pm, please refer to night coverage provider listed on amion.

## 2023-08-09 NOTE — Evaluation (Signed)
Physical Therapy Evaluation Patient Details Name: Keith Drake MRN: [id number] DOB: 1935-08-08 Today's Date: 08/09/2023  History of Present Illness  Mickale Lawrence is a 88 y.o. male with medical history significant of anemia, bladder cancer, osteoarthritis, stage IV CKD, nephrolithiasis, hyperlipidemia, hypertension, RBBB, Mobitz 1 heart block, pacemaker placement, spondylolisthesis and type 2 diabetes presented to emergency department 08/08/23 for complaining about confusion, weakness and vomiting.CBC showing leukocytosis 12.4 and stable H&H 12.8 and 39.     CMP showing sodium 130, potassium 4.7, chloride 96, bicarb 16, elevated blood glucose 1200. Per chart, vertigo last admission.  Clinical Impression  Pt admitted with above diagnosis. Marland Kitchensign Pt currently with functional limitations due to the deficits listed below (see PT Problem List). Pt will benefit from acute skilled PT to increase their independence and safety with mobility to allow discharge.     The patient   is alert , oriented to place and reason, not date. Patient reports he does not have  dizziness at the present and reports had some yesterday.  Patient reports having a fall at [facility], ?  When it occurred.   Patient ambulated with Rw and contact guard. Patient settled in recliner, granddaughter in room. Patient began to eat breakfast and became nauseated, dry heaves. RN notified.  Patient /family reports that HHPT was hit or miss after DC, ? Patient not remembering appointment per granddaughter. Patient  will benefit from increased supervision by  family due to  fall concerns and ? AMS at times.        If plan is discharge home, recommend the following: A little help with walking and/or transfers;A little help with bathing/dressing/bathroom;Assistance with cooking/housework;Assist for transportation;Help with stairs or ramp for entrance   Can travel by private vehicle        Equipment Recommendations None recommended by PT   Recommendations for Other Services       Functional Status Assessment Patient has had a recent decline in their functional status and demonstrates the ability to make significant improvements in function in a reasonable and predictable amount of time.     Precautions / Restrictions Precautions Precautions: Fall Precaution Comments: recent H/O vertigo, last adm(07/21/23) Restrictions Weight Bearing Restrictions: No      Mobility  Bed Mobility   Bed Mobility: Supine to Sit     Supine to sit: Min assist     General bed mobility comments: with increased time , min  HHA to sit upright    Transfers Overall transfer level: Needs assistance Equipment used: Rolling walker (2 wheels)   Sit to Stand: Contact guard assist           General transfer comment: stands with no support, extra time    Ambulation/Gait Ambulation/Gait assistance: Contact guard assist Gait Distance (Feet): 120 Feet Assistive device: Rolling walker (2 wheels) Gait Pattern/deviations: Step-through pattern Gait velocity: decreased     General Gait Details: gait slow and steady  Stairs            Wheelchair Mobility     Tilt Bed    Modified Rankin (Stroke Patients Only)       Balance Overall balance assessment: Needs assistance, History of Falls Sitting-balance support: Feet supported Sitting balance-[name]: Fair     Standing balance support: During functional activity, Reliant on assistive device for balance, Bilateral upper extremity supported Standing balance-[name]: Fair  Pertinent Vitals/Pain Pain Assessment Pain Assessment: No/denies pain    Home Living Family/patient expects to be discharged to:: Private residence Living Arrangements: Alone Available Help at Discharge: Family;Available PRN/intermittently Type of Home: House Home Access: Stairs to enter Entrance Stairs-Rails: Lawyer of  Steps: 2   Home Layout: One level Home Equipment: Rollator (4 wheels);Cane - single point;Shower seat Additional Comments: family close by and check on pt frequently, Suzan Garibaldi, provided update    Prior Function Prior Level of Function : Independent/Modified Independent;History of Falls (last six months)             Mobility Comments: pt reports mod I at [facility] level for household mobility with need to reach funiture for stability when using cane. reports 1 fall recently ADLs Comments: mod I for ADLs, self care tasks     Extremity/Trunk Assessment   Upper Extremity Assessment Upper Extremity Assessment: Overall WFL for tasks assessed    Lower Extremity Assessment Lower Extremity Assessment: Overall WFL for tasks assessed    Cervical / Trunk Assessment Cervical / Trunk Assessment: Normal  Communication   Communication Communication: Hearing impairment  Cognition Arousal: Alert Behavior During Therapy: WFL for tasks assessed/performed Overall Cognitive Status: Within Functional Limits for tasks assessed                                 General Comments: except  date_ tuesday, 4th        General Comments      Exercises     Assessment/Plan    PT Assessment Patient needs continued PT services  PT Problem List Decreased activity tolerance;Decreased balance;Decreased mobility;Decreased coordination;Pain       PT Treatment Interventions DME instruction;Gait training;Stair training;Functional mobility training;Therapeutic activities;Therapeutic exercise;Balance training;Neuromuscular re-education;Patient/family education    PT Goals (Current goals can be found in the Care Plan section)  Acute Rehab PT Goals Patient Stated Goal: to be able to go home PT Goal Formulation: With patient/family Time For Goal Achievement: 08/23/23 Potential to Achieve Goals: Good    Frequency Min 1X/week     Co-evaluation               AM-PAC PT "6 Clicks"  Mobility  Outcome Measure Help needed turning from your back to your side while in a flat bed without using bedrails?: A Little Help needed moving from lying on your back to sitting on the side of a flat bed without using bedrails?: A Little Help needed moving to and from a bed to a chair (including a wheelchair)?: A Little Help needed standing up from a chair using your arms (e.g., wheelchair or bedside chair)?: A Little Help needed to walk in hospital room?: A Little Help needed climbing 3-5 steps with a railing? : A Lot 6 Click Score: 17    End of Session Equipment Utilized During Treatment: Gait belt Activity Tolerance: Patient tolerated treatment well;Other (comment) Patient left: in chair;with call bell/phone within reach;with family/visitor present;with chair alarm set Nurse Communication: Mobility status;Other (comment) PT Visit Diagnosis: Unsteadiness on feet (R26.81);Other abnormalities of gait and mobility (R26.89);Muscle weakness (generalized) (M62.81);History of falling (Z91.81);Pain;Difficulty in walking, not elsewhere classified (R26.2)    Time: 1610-9604 PT Time Calculation (min) (ACUTE ONLY): 29 min   Charges:   PT Evaluation $PT Eval Low Complexity: 1 Low PT Treatments $Gait Training: 8-22 mins PT General Charges $$ ACUTE PT VISIT: 1 Visit  Blanchard Kelch PT Acute Rehabilitation Services Office [phone] Weekend [phone]   Rada Hay 08/09/2023, 8:57 AM

## 2023-08-10 ENCOUNTER — Ambulatory Visit: Payer: Medicare Other | Admitting: Internal Medicine

## 2023-08-10 DIAGNOSIS — D631 Anemia in chronic kidney disease: Secondary | ICD-10-CM | POA: Diagnosis not present

## 2023-08-10 DIAGNOSIS — I10 Essential (primary) hypertension: Secondary | ICD-10-CM | POA: Diagnosis not present

## 2023-08-10 DIAGNOSIS — N184 Chronic kidney disease, stage 4 (severe): Secondary | ICD-10-CM | POA: Diagnosis not present

## 2023-08-10 DIAGNOSIS — E1110 Type 2 diabetes mellitus with ketoacidosis without coma: Secondary | ICD-10-CM | POA: Diagnosis not present

## 2023-08-10 LAB — BASIC METABOLIC PANEL
Anion gap: 10 (ref 5–15)
BUN: 33 mg/dL — ABNORMAL HIGH (ref 8–23)
CO2: 20 mmol/L — ABNORMAL LOW (ref 22–32)
Calcium: 9.9 mg/dL (ref 8.9–10.3)
Chloride: 106 mmol/L (ref 98–111)
Creatinine, Ser: 2.04 mg/dL — ABNORMAL HIGH (ref 0.61–1.24)
GFR, Estimated: 31 mL/min — ABNORMAL LOW (ref >60)
Glucose, Bld: 205 mg/dL — ABNORMAL HIGH (ref 70–99)
Potassium: 4.3 mmol/L (ref 3.5–5.1)
Sodium: 136 mmol/L (ref 135–145)

## 2023-08-10 LAB — GLUCOSE, CAPILLARY
Glucose-Capillary: 224 mg/dL — ABNORMAL HIGH (ref 70–99)
Glucose-Capillary: 170 mg/dL — ABNORMAL HIGH (ref 70–99)
Glucose-Capillary: 121 mg/dL — ABNORMAL HIGH (ref 70–99)
Glucose-Capillary: 98 mg/dL (ref 70–99)

## 2023-08-10 MED ORDER — INSULIN ASPART 100 UNIT/ML IJ SOLN
4.0000 [IU] | Freq: Three times a day (TID) | INTRAMUSCULAR | Status: DC
  Administered 2023-08-10 – 2023-08-11 (×5): 4 [IU] via SUBCUTANEOUS

## 2023-08-10 MED ORDER — INSULIN GLARGINE-YFGN 100 UNIT/ML ~~LOC~~ SOLN
10.0000 [IU] | Freq: Once | SUBCUTANEOUS | Status: AC
  Administered 2023-08-10: 10 [IU] via SUBCUTANEOUS
  Filled 2023-08-10: qty 0.1

## 2023-08-10 MED ORDER — INSULIN GLARGINE-YFGN 100 UNIT/ML ~~LOC~~ SOLN
20.0000 [IU] | Freq: Every day | SUBCUTANEOUS | Status: DC
  Administered 2023-08-11: 20 [IU] via SUBCUTANEOUS
  Filled 2023-08-10: qty 0.2

## 2023-08-10 NOTE — Progress Notes (Signed)
Mobility Specialist - Progress Note   08/10/23 1112  Mobility  Activity Ambulated with assistance in hallway  Level of Assistance Contact guard assist, steadying assist  Assistive Device Front wheel walker  Distance Ambulated (ft) 140 ft  Range of Motion/Exercises Active  Activity Response Tolerated well  Mobility Referral Yes  $Mobility charge 1 Mobility  Mobility Specialist Start Time (ACUTE ONLY) 1050  Mobility Specialist Stop Time (ACUTE ONLY) 1100  Mobility Specialist Time Calculation (min) (ACUTE ONLY) 10 min   Pt received in [facility] and agreed to mobility, no issues throughout session and returned to bed with all needs met, NT in room.  Marilynne Halsted Mobility Specialist

## 2023-08-10 NOTE — Progress Notes (Signed)
[facility]                                                                              Keith Drake, is a 88 y.o. male, DOB - [birth year], VHQ:[id number] Admit date - 08/08/2023    Outpatient Primary MD for the patient is Rodrigo Ran, MD  LOS - 2  days  Chief Complaint  Patient presents with   Emesis       Brief summary   Patient is a 88 year old male with anemia, history of bladder CA, osteoarthritis, CKD stage IV, HTN, HLP, RBBB, Mobitz type I heart block, pacemaker, DM type II presented with confusion, generalized weakness and vomiting.  Patient reported weakness in the last 2 to 3 days prior to admission with vomiting.  On the day of admission blood sugars were elevated patient also reported some mild diffuse abdominal pain, no fevers or chills, no chest pain or shortness of breath. In ED, sodium 130, bicarb 16, elevated blood glucose >1200, BUN 47 creatinine 2.81 elevated anion gap 18 CT abdomen showed near complete resolution of previously seen peripancreatic inflammatory changes Patient was admitted for early DKA/ HONK and started on insulin drip.  Assessment & Plan    Principal Problem:   Hyperosmolar hyperglycemic state (HHS) (HCC) in the setting of uncontrolled DM type 2 -Patient was placed on IV fluid hydration, IV insulin drip.  Elevated CBG> 1200, creatinine 2.81, anion gap metabolic acidosis, osmolality 413, now transitioned to subcutaneous insulin -Hemoglobin A1c 9.1 on 07/19/2023 CBG (last 3)  Recent Labs    08/09/23 2117 08/10/23 0745 08/10/23 1200  GLUCAP 195* 224* 170*   -Diabetic coordinator consult placed -CBGs still elevated, increased Semglee to 20 units daily, increase NovoLog to 4 units 3 times daily AC, continue sliding scale insulin (outpatient on Tresiba 30 units daily with Humalog)  Active Problems: Acute kidney injury on CKD stage IV -Presented with creatinine of 2.81, baseline creatinine 2.0-2.3 -Likely precipitated due to  #1 -Creatinine improving, received IV fluid hydration.  Cr 2.0, now at baseline -Continue bicarb for RTA    Essential hypertension -Stable, continue amlodipine    Anemia in chronic renal disease -Hemoglobin 14.3 on admission likely due to hemoconcentration -Hemoglobin was 11.4 on 07/19/2023 -H&H at baseline  Generalized debility, falls at home -PT OT evaluation > home health PT  History of complete heart block -Status post pacemaker in 2019, follows cardiology outpatient, Dr. Royann Shivers  Chronic back pain -Currently stable  Estimated body mass index is 23.28 kg/m as calculated from the following:   Height as of this encounter: 5\' 10"  (1.778 m).   Weight as of this encounter: 73.6 kg.  Code Status: Full code DVT Prophylaxis:  enoxaparin (LOVENOX) injection 30 mg Start: 08/09/23 1000   Level of Care: Level of care: Telemetry Family Communication: Updated patient Disposition Plan:      Remains inpatient appropriate: Patient wants to go home tomorrow am.   Procedures:  None  Consultants:   None  Antimicrobials:   Anti-infectives (From admission, onward)    None          Medications  amLODipine  10 mg Oral Daily  Chlorhexidine Gluconate Cloth  6 each Topical Daily   enoxaparin (LOVENOX) injection  30 mg Subcutaneous Q24H   insulin aspart  0-5 Units Subcutaneous QHS   insulin aspart  0-9 Units Subcutaneous TID WC   insulin aspart  4 Units Subcutaneous TID WC   [START ON 08/11/2023] insulin glargine-yfgn  20 Units Subcutaneous Daily   sodium bicarbonate  1,300 mg Oral BID      Subjective:   Keith Drake was seen and examined today.  Feels a lot better today.  No acute nausea or vomiting.  No abdominal pain.  Tolerating diet.    Objective:   Vitals:   08/09/23 1612 08/09/23 2033 08/10/23 0447 08/10/23 1203  BP: 139/86 (!) 162/84 113/69 (!) 154/72  Pulse: 70 74 72 76  Resp: 14 18 17 17   Temp: 98.9 F (37.2 C) 99.3 F (37.4 C) 98.8 F (37.1 C) 98  F (36.7 C)  TempSrc: Oral Oral Oral Oral  SpO2: 100% 100% 100% 100%  Weight:      Height:        Intake/Output Summary (Last 24 hours) at 08/10/2023 1421 Last data filed at 08/10/2023 1114 Gross per 24 hour  Intake 592.43 ml  Output 1000 ml  Net -407.57 ml     Wt Readings from Last 3 Encounters:  08/09/23 73.6 kg  07/19/23 79.4 kg  06/29/23 79.4 kg    Physical Exam General: Alert and oriented x 3, NAD, pleasant, hard of hearing Cardiovascular: S1 S2 clear, RRR.  Respiratory: CTAB, no wheezing Gastrointestinal: Soft, nontender, nondistended, NBS Ext: no pedal edema bilaterally Neuro: no new deficits Psych: Normal affect     Data Reviewed:  I have personally reviewed following labs    CBC Lab Results  Component Value Date   WBC 9.9 08/09/2023   RBC 4.05 (L) 08/09/2023   HGB 11.3 (L) 08/09/2023   HCT 35.5 (L) 08/09/2023   MCV 87.7 08/09/2023   MCH 27.9 08/09/2023   PLT 142 (L) 08/09/2023   MCHC 31.8 08/09/2023   RDW 15.0 08/09/2023   LYMPHSABS 4.2 (H) 08/08/2023   MONOABS 0.9 08/08/2023   EOSABS 0.0 08/08/2023   BASOSABS 0.1 08/08/2023     Last metabolic panel Lab Results  Component Value Date   NA 136 08/10/2023   K 4.3 08/10/2023   CL 106 08/10/2023   CO2 20 (L) 08/10/2023   BUN 33 (H) 08/10/2023   CREATININE 2.04 (H) 08/10/2023   GLUCOSE 205 (H) 08/10/2023   GFRNONAA 31 (L) 08/10/2023   GFRAA 29 (L) 08/03/2018   CALCIUM 9.9 08/10/2023   PROT 7.6 08/08/2023   ALBUMIN 3.9 08/08/2023   BILITOT 1.0 08/08/2023   ALKPHOS 99 08/08/2023   AST 31 08/08/2023   ALT 34 08/08/2023   ANIONGAP 10 08/10/2023    CBG (last 3)  Recent Labs    08/09/23 2117 08/10/23 0745 08/10/23 1200  GLUCAP 195* 224* 170*      Coagulation Profile: No results for input(s): "INR", "PROTIME" in the last 168 hours.   Radiology Studies: I have personally reviewed the imaging studies  CT ABDOMEN PELVIS WO CONTRAST  Result Date: 08/08/2023 CLINICAL DATA:   Abdominal pain. EXAM: CT ABDOMEN AND PELVIS WITHOUT CONTRAST TECHNIQUE: Multidetector CT imaging of the abdomen and pelvis was performed following the standard protocol without IV contrast. RADIATION DOSE REDUCTION: This exam was performed according to the departmental dose-optimization program which includes automated exposure control, adjustment of the mA and/or kV according to patient size and/or use  of iterative reconstruction technique. COMPARISON:  CT abdomen pelvis dated 07/19/2023. FINDINGS: Lower chest: Mild bibasilar atelectasis. Hepatobiliary: Multiple hepatic cysts are noted with the largest measuring 1.3 cm. No imaging follow-up is recommended. No gallstones, gallbladder wall thickening, or biliary dilatation. Pancreas: Previously seen peripancreatic inflammatory changes have nearly completely resolved. No pancreatic ductal dilatation or surrounding inflammatory changes. Spleen: Normal in size without focal abnormality. Adrenals/Urinary Tract: Adrenal glands are unremarkable. Bilateral renal cysts measure up to 1.6 cm. A nonobstructive left renal calculus measures 2 mm. No renal calculi on the right. No hydronephrosis on either side. A few bladder diverticuli are seen posteriorly. Stomach/Bowel: Stomach is within normal limits. The appendix is likely absent. No evidence of bowel wall thickening, distention, or inflammatory changes. Vascular/Lymphatic: Aortic atherosclerosis. No enlarged abdominal or pelvic lymph nodes. Reproductive: Prostate is unremarkable. Other: No abdominal wall hernia or abnormality. No abdominopelvic ascites. Musculoskeletal: Fixation hardware at L4-L5 is redemonstrated. Degenerative changes are seen in the spine, most significant at L3-L4. IMPRESSION: 1. Near complete resolution of previously seen peripancreatic inflammatory changes. No other findings to explain the patient's symptoms. Aortic Atherosclerosis (ICD10-I70.0). Electronically Signed   By: Romona Curls M.D.   On:  08/08/2023 18:50   DG Chest 2 View  Result Date: 08/08/2023 CLINICAL DATA:  Emesis for 3 days. Hyperglycemia. Question pneumonia. EXAM: CHEST - 2 VIEW COMPARISON:  One-view chest x-ray 07/19/2023 FINDINGS: The heart size is normal. Pacing wires are stable. The lungs are clear. The visualized soft tissues and bony thorax are unremarkable. IMPRESSION: No acute cardiopulmonary disease. Electronically Signed   By: Marin Roberts M.D.   On: 08/08/2023 17:14       Irvin Lizama M.D. [facility] 08/10/2023, 2:21 PM  Available via Epic secure chat 7am-7pm After 7 pm, please refer to night coverage provider listed on amion.

## 2023-08-10 NOTE — Plan of Care (Signed)

## 2023-08-10 NOTE — TOC Initial Note (Signed)
Transition of Care [facility]) - Initial/Assessment Note    Patient Details  Name: Keith Drake MRN: [id number] Date of Birth: 1935-03-16  Transition [facility] CM/SW Contact:    Otelia Santee, LCSW Phone Number: 08/10/2023, 12:31 PM  Clinical Narrative:                 Pt from home w/ son. Pt active w/ [facility] for home health PT,OT,RN, and aide. ROC orders will need to be placed prior to discharge. Pt has rollator and cane at home. No further DME needs identified. TOC will follow.   Expected Discharge Plan: Home w Home Health Services Barriers to Discharge: No Barriers Identified   Patient Goals and CMS Choice Patient states their goals for this hospitalization and ongoing recovery are:: To return home w/ son CMS Medicare.gov Compare Post Acute Care list provided to::  (NA) Choice offered to / list presented to :  (NA) [place] ownership interest in [facility].provided to::  (NA)    Expected Discharge Plan and Services In-house Referral: Clinical Social Work Discharge Planning Services: NA Post Acute Care Choice: Resumption of Svcs/PTA Provider, Home Health Living arrangements for the past 2 months: Single Family Home                 DME Arranged: N/A DME Agency: NA                  Prior Living Arrangements/Services Living arrangements for the past 2 months: Single Family Home Lives with:: Adult Children Patient language and need for interpreter reviewed:: Yes Do you feel safe going back to the place where you live?: Yes      Need for Family Participation in Patient Care: No (Comment) Care giver support system in place?: Yes (comment) Current home services: DME, Home OT, Home PT, Home RN, Homehealth aide Criminal Activity/Legal Involvement Pertinent to Current Situation/Hospitalization: No - Comment as needed  Activities of Daily Living   ADL Screening (condition at time of admission) Independently performs ADLs?: Yes (appropriate for  developmental age) Is the patient deaf or have difficulty hearing?: Yes Does the patient have difficulty seeing, even when wearing glasses/contacts?: No Does the patient have difficulty concentrating, remembering, or making decisions?: No  Permission Sought/Granted Permission sought to share information with : Family Supports, Oceanographer granted to share information with : Yes, Verbal Permission Granted  Share Information with NAME: Son, Barbara Cower  Permission granted to share info w AGENCY: [facility]        Emotional Assessment Appearance:: Appears stated age Attitude/Demeanor/Rapport: Engaged Affect (typically observed): Accepting Orientation: : Oriented to Self, Oriented to Place, Oriented to  Time, Oriented to Situation Alcohol / Substance Use: Not Applicable Psych Involvement: No (comment)  Admission diagnosis:  Diabetic keto-acidosis (HCC) [E11.10] Diabetic ketoacidosis without coma associated with type 2 diabetes mellitus (HCC) [E11.10] Hyperosmolar hyperglycemic state (HHS) (HCC) [E11.00] Hyperglycemia [R73.9] Patient Active Problem List   Diagnosis Date Noted   Hyperosmolar hyperglycemic state (HHS) (HCC) 08/08/2023   Hypercalcemia 07/20/2023   Acute pancreatitis 07/19/2023   Impairment of balance 07/19/2023   Type 2 diabetes mellitus (HCC) 07/19/2023   CKD (chronic kidney disease), stage IV (HCC) 07/19/2023   Anemia in chronic renal disease 07/19/2023   Mild protein malnutrition (HCC) 07/19/2023   Rosacea 07/19/2023   Infective urethritis 08/30/2018   Spondylolisthesis at L4-L5 level 08/15/2018   Symptomatic bradycardia 07/20/2018   Second degree AV block, Mobitz type I 07/20/2018   Essential hypertension 03/23/2013  Hyperlipidemia 03/23/2013   Insulin dependent diabetes mellitus 03/23/2013   Right bundle branch block    PCP:  Rodrigo Ran, MD Pharmacy:   [facility] [phone] - [place], [place] - [address] AT [facility] [address] [place] [address] Phone: [phone] Fax: [phone]     Social Determinants of Health (SDOH) Social History: SDOH Screenings   Food Insecurity: No Food Insecurity (08/09/2023)  Housing: Low Risk  (08/09/2023)  Transportation Needs: No Transportation Needs (08/09/2023)  Utilities: Not At Risk (08/09/2023)  Tobacco Use: Medium Risk (08/08/2023)   SDOH Interventions:     Readmission Risk Interventions    08/10/2023   12:29 PM  Readmission Risk Prevention Plan  Transportation Screening Complete  PCP or Specialist Appt within 5-7 Days Complete  Home Care Screening Complete  Medication Review (RN CM) Complete

## 2023-08-10 NOTE — Inpatient Diabetes Management (Addendum)
Inpatient Diabetes Program Recommendations  AACE/ADA: New Consensus Statement on Inpatient Glycemic Control (2015)  Target Ranges:  Prepandial:   less than 140 mg/dL      Peak postprandial:   less than 180 mg/dL (1-2 hours)      Critically ill patients:  140 - 180 mg/dL   Lab Results  Component Value Date   GLUCAP 224 (H) 08/10/2023   HGBA1C 9.1 (H) 07/19/2023    Review of Glycemic Control  Diabetes history: DM2 Outpatient Diabetes medications: Tresiba 30 every day, Humalog 6 BID Current orders for Inpatient glycemic control: Semglee 20 daily, Novolog 0-9 units TID with meals + 4 units TID  Inpatient Diabetes Program Recommendations:    Spoke with pt at bedside regarding his diabetes. Discussed his new insulin Evaristo Bury and Humalog). Discussed hypoglycemia s/s and treatment. Pt states he checks blood sugars twice/day and gives insulin in abdomen.  Pt very HOH. States he will f/u with Dr Waynard Edwards regarding his diabetes.   See Diabetes Discharge Order Recommendations.   Continue to follow.  Thank you. Ailene Ards, RD, LDN, CDCES Inpatient Diabetes Coordinator [phone]

## 2023-08-10 NOTE — Progress Notes (Signed)
Physical Therapy Treatment Patient Details Name: Keith Drake MRN: [id number] DOB: 1934-11-22 Today's Date: 08/10/2023   History of Present Illness Keith Drake is a 88 y.o. male with medical history significant of anemia, bladder cancer, osteoarthritis, stage IV CKD, nephrolithiasis, hyperlipidemia, hypertension, RBBB, Mobitz 1 heart block, pacemaker placement, spondylolisthesis and type 2 diabetes presented to emergency department 08/08/23 for complaining about confusion, weakness and vomiting.CBC showing leukocytosis 12.4 and stable H&H 12.8 and 39.     CMP showing sodium 130, potassium 4.7, chloride 96, bicarb 16, elevated blood glucose 1200. Per chart, vertigo last admission.    PT Comments  Pt with gradual progress.  He ambulated increased distance with mobility specialist earlier.  With therapy he needed CGA for transfers but increased time, CGA to ambulate with cues for RW proximity.  This PT session focused on transfers and vestibular assessment as pt reports dizziness, "room spinning" when transitioning to lying and sitting.  Pt with + for posterior canal BPPV on L side - tolerated Epley maneuver well.  Will plan to f/u tomorrow morning for f/u Epley and to provide HEP.  Pt would benefit from outpt PT vestibular to further treat BPPV.    If plan is discharge home, recommend the following: A little help with walking and/or transfers;A little help with bathing/dressing/bathroom;Assistance with cooking/housework;Assist for transportation;Help with stairs or ramp for entrance   Can travel by private vehicle        Equipment Recommendations  None recommended by PT    Recommendations for Other Services       Precautions / Restrictions Precautions Precautions: Fall     Mobility  Bed Mobility Overal bed mobility: Needs Assistance Bed Mobility: Supine to Sit     Supine to sit: Supervision, Used rails     General bed mobility comments: Increased time and heavy use of rails     Transfers Overall transfer level: Needs assistance Equipment used: Rolling walker (2 wheels) Transfers: Sit to/from Stand Sit to Stand: Contact guard assist           General transfer comment: From Bed and [place] with cues for hand placement. Pt completed his toileting ADLs without assist    Ambulation/Gait Ambulation/Gait assistance: Contact guard assist Gait Distance (Feet): 15 Feet (15'x2) Assistive device: Rolling walker (2 wheels) Gait Pattern/deviations: Step-through pattern, Trunk flexed Gait velocity: decreased     General Gait Details: Cues for RW proximity.  Pt ambulated 140' earlier with mobility. Limited this session due to food arriving, needing to have BM, and needing to complete vestibular testing   Stairs             Wheelchair Mobility     Tilt Bed    Modified Rankin (Stroke Patients Only)       Balance Overall balance assessment: Needs assistance, History of Falls Sitting-balance support: Feet supported Sitting balance-[name]: Good     Standing balance support: During functional activity, Reliant on assistive device for balance, Bilateral upper extremity supported Standing balance-[name]: Poor Standing balance comment: Steady wtih UE support but needs UE support (when washing hands , leans on elbows)                            Cognition Arousal: Alert Behavior During Therapy: WFL for tasks assessed/performed Overall Cognitive Status: Within Functional Limits for tasks assessed  Exercises      General Comments  VESTIBULAR:  Pt reports 3 week hx of vertigo with room spinning.  States occurs when he lays down at night or when he sits up first thing in morning.  States last 2-4 mins.  States has had 3 x in past.   Smooth Pursuit: intact Spontaneous Nystagmus: negative Gaze induced nystagmus: negative VOR/Gaze stabilization: intact [facility] L : +,  treated with modified Epley using bed features [employer] R: negative  Educated pt on BPPV.      Pertinent Vitals/Pain Pain Assessment Pain Assessment: No/denies pain    Home Living                          Prior Function            PT Goals (current goals can now be found in the care plan section) Progress towards PT goals: Progressing toward goals    Frequency    Min 1X/week      PT Plan      Co-evaluation              AM-PAC PT "6 Clicks" Mobility   Outcome Measure  Help needed turning from your back to your side while in a flat bed without using bedrails?: A Little Help needed moving from lying on your back to sitting on the side of a flat bed without using bedrails?: A Little Help needed moving to and from a bed to a chair (including a wheelchair)?: A Little Help needed standing up from a chair using your arms (e.g., wheelchair or bedside chair)?: A Little Help needed to walk in hospital room?: A Little Help needed climbing 3-5 steps with a railing? : A Lot 6 Click Score: 17    End of Session Equipment Utilized During Treatment: Gait belt Activity Tolerance: Patient tolerated treatment well;Other (comment) Patient left: with call bell/phone within reach;in bed;with bed alarm set Nurse Communication: Mobility status PT Visit Diagnosis: Unsteadiness on feet (R26.81);Other abnormalities of gait and mobility (R26.89);Muscle weakness (generalized) (M62.81);History of falling (Z91.81);Pain;Difficulty in walking, not elsewhere classified (R26.2)     Time: 1722-1800 PT Time Calculation (min) (ACUTE ONLY): 38 min  Charges:    $Gait Training: 8-22 mins $Therapeutic Activity: 8-22 mins $Canalith Rep Proc: 8-22 mins PT General Charges $$ ACUTE PT VISIT: 1 Visit                     Anise Salvo, PT Acute Rehab Services [facility] [phone]    Rayetta Humphrey 08/10/2023, 6:08 PM

## 2023-08-11 ENCOUNTER — Other Ambulatory Visit (HOSPITAL_COMMUNITY): Payer: Self-pay

## 2023-08-11 DIAGNOSIS — E1110 Type 2 diabetes mellitus with ketoacidosis without coma: Secondary | ICD-10-CM | POA: Diagnosis not present

## 2023-08-11 DIAGNOSIS — E1100 Type 2 diabetes mellitus with hyperosmolarity without nonketotic hyperglycemic-hyperosmolar coma (NKHHC): Secondary | ICD-10-CM | POA: Diagnosis not present

## 2023-08-11 DIAGNOSIS — R2689 Other abnormalities of gait and mobility: Secondary | ICD-10-CM | POA: Diagnosis not present

## 2023-08-11 LAB — BASIC METABOLIC PANEL
Anion gap: 10 (ref 5–15)
BUN: 31 mg/dL — ABNORMAL HIGH (ref 8–23)
CO2: 22 mmol/L (ref 22–32)
Calcium: 10.2 mg/dL (ref 8.9–10.3)
Chloride: 106 mmol/L (ref 98–111)
Creatinine, Ser: 2.16 mg/dL — ABNORMAL HIGH (ref 0.61–1.24)
GFR, Estimated: 29 mL/min — ABNORMAL LOW (ref >60)
Glucose, Bld: 121 mg/dL — ABNORMAL HIGH (ref 70–99)
Potassium: 4.1 mmol/L (ref 3.5–5.1)
Sodium: 138 mmol/L (ref 135–145)

## 2023-08-11 LAB — GLUCOSE, CAPILLARY
Glucose-Capillary: 113 mg/dL — ABNORMAL HIGH (ref 70–99)
Glucose-Capillary: 149 mg/dL — ABNORMAL HIGH (ref 70–99)

## 2023-08-11 MED ORDER — INSULIN DETEMIR 100 UNIT/ML FLEXPEN: 20.0000 [IU] | PEN_INJECTOR | Freq: Every day | SUBCUTANEOUS | 0 refills | Status: DC

## 2023-08-11 MED ORDER — INSULIN PEN NEEDLE 31G X 5 MM MISC: 1.0000 | Freq: Four times a day (QID) | 0 refills | Status: DC

## 2023-08-11 MED ORDER — INSULIN LISPRO (1 UNIT DIAL) 100 UNIT/ML (KWIKPEN): 4.0000 [IU] | PEN_INJECTOR | Freq: Three times a day (TID) | SUBCUTANEOUS | 0 refills | Status: AC

## 2023-08-11 NOTE — Discharge Summary (Signed)
Physician Discharge Summary   Patient: Keith Drake MRN: [id number] DOB: [birth year]  Admit date:     08/08/2023  Discharge date: 08/11/23  Discharge Physician: Rickey Barbara   PCP: Rodrigo Ran, MD   Recommendations at discharge:    Follow up with PCP in 1-2 weeks  Discharge Diagnoses: Principal Problem:   Hyperosmolar hyperglycemic state (HHS) (HCC) Active Problems:   Essential hypertension   CKD (chronic kidney disease), stage IV (HCC)   Hyperlipidemia   Type 2 diabetes mellitus (HCC)   Anemia in chronic renal disease  Resolved Problems:   Diabetic keto-acidosis [facility])  Hospital Course: 88 year old male with anemia, history of bladder CA, osteoarthritis, CKD stage IV, HTN, HLP, RBBB, Mobitz type I heart block, pacemaker, DM type II presented with confusion, generalized weakness and vomiting.  Patient reported weakness in the last 2 to 3 days prior to admission with vomiting.  On the day of admission blood sugars were elevated patient also reported some mild diffuse abdominal pain, no fevers or chills, no chest pain or shortness of breath. In ED, sodium 130, bicarb 16, elevated blood glucose >1200, BUN 47 creatinine 2.81 elevated anion gap 18 CT abdomen showed near complete resolution of previously seen peripancreatic inflammatory changes Patient was admitted for early DKA/ HONK and started on insulin drip.  Assessment and Plan: Principal Problem:   Hyperosmolar hyperglycemic state (HHS) (HCC) in the setting of uncontrolled DM type 2 -Patient was placed on IV fluid hydration, IV insulin drip.  Elevated CBG> 1200, creatinine 2.81, anion gap metabolic acidosis, osmolality 010, later transitioned to subcutaneous insulin -Hemoglobin A1c 9.1 on 07/19/2023 -Diabetic coordinator consulted -Pt was transitioned to [facility] to 20 units daily, increase NovoLog to 4 units 3 times daily AC   Active Problems: Acute kidney injury on CKD stage IV -Presented with creatinine of 2.81, baseline  creatinine 2.0-2.3 -Likely precipitated due to #1 -Creatinine improving, received IV fluid hydration.  Cr now at baseline -was given bicarb for RTA     Essential hypertension -Stable, continue amlodipine     Anemia in chronic renal disease -Hemoglobin 14.3 on admission likely due to hemoconcentration -Hemoglobin was 11.4 on 07/19/2023 -H&H at baseline   Generalized debility, falls at home -PT OT evaluation > home health PT   History of complete heart block -Status post pacemaker in 2019, follows cardiology outpatient, Dr. Royann Shivers   Chronic back pain -Currently stable    Consultants:  Procedures performed:   Disposition: Home Diet recommendation:  Carb modified diet DISCHARGE MEDICATION: Allergies as of 08/11/2023       Reactions   Fosinopril Cough   Fenofibrate Rash        Medication List     STOP taking these medications    dicyclomine 20 MG tablet Commonly known as: BENTYL   insulin lispro 100 UNIT/ML injection Commonly known as: HUMALOG Replaced by: insulin lispro 100 UNIT/ML KwikPen   Tresiba FlexTouch 100 UNIT/ML FlexTouch Pen Generic drug: insulin degludec       TAKE these medications    acetaminophen 500 MG tablet Commonly known as: TYLENOL Take 1,000 mg by mouth every 6 (six) hours as needed for mild pain (pain score 1-3).   allopurinol 100 MG tablet Commonly known as: ZYLOPRIM Take 100 mg by mouth daily.   amLODipine 10 MG tablet Commonly known as: NORVASC Take 10 mg by mouth daily.   aspirin 81 MG tablet Take 81 mg by mouth daily.   cyanocobalamin 1000 MCG tablet Take 1 tablet (1,000 mcg total) by  mouth daily.   esomeprazole 20 MG capsule Commonly known as: NexIUM Take 1 capsule (20 mg total) by mouth daily.   Fe Fum-Vit C-Vit B12-FA Caps capsule Commonly known as: TRIGELS-F FORTE Take 1 capsule by mouth 2 (two) times daily.   insulin detemir 100 UNIT/ML FlexPen Commonly known as: LEVEMIR Inject 20 Units into the skin  daily.   insulin lispro 100 UNIT/ML KwikPen Commonly known as: HumaLOG KwikPen Inject 4 Units into the skin 3 (three) times daily. Replaces: insulin lispro 100 UNIT/ML injection   Insulin Pen Needle 31G X 5 MM Misc 1 Device by Does not apply route 4 (four) times daily. For use with insulin pens   ondansetron 4 MG tablet Commonly known as: ZOFRAN Take 1 tablet (4 mg total) by mouth every 6 (six) hours as needed for nausea.   sodium bicarbonate 650 MG tablet Take 1,300 mg by mouth 2 (two) times daily.        Follow-up Information     Rodrigo Ran, MD Follow up in 2 week(s).   Specialty: Internal Medicine Why: Hospital follow up Contact information: [address] [place] [address] [phone]                Discharge Exam: Ceasar Mons Weights   08/08/23 1603 08/09/23 0009  Weight: 79.4 kg 73.6 kg   General exam: Awake, laying in bed, in nad Respiratory system: Normal respiratory effort, no wheezing Cardiovascular system: regular rate, s1, s2 Gastrointestinal system: Soft, nondistended, positive BS Central nervous system: CN2-12 grossly intact, strength intact Extremities: Perfused, no clubbing Skin: Normal skin turgor, no notable skin lesions seen Psychiatry: Mood normal // no visual hallucinations   Condition at discharge: fair  The results of significant diagnostics from this hospitalization (including imaging, microbiology, ancillary and laboratory) are listed below for reference.   Imaging Studies: CT ABDOMEN PELVIS WO CONTRAST  Result Date: 08/08/2023 CLINICAL DATA:  Abdominal pain. EXAM: CT ABDOMEN AND PELVIS WITHOUT CONTRAST TECHNIQUE: Multidetector CT imaging of the abdomen and pelvis was performed following the standard protocol without IV contrast. RADIATION DOSE REDUCTION: This exam was performed according to the departmental dose-optimization program which includes automated exposure control, adjustment of the mA and/or kV according to patient  size and/or use of iterative reconstruction technique. COMPARISON:  CT abdomen pelvis dated 07/19/2023. FINDINGS: Lower chest: Mild bibasilar atelectasis. Hepatobiliary: Multiple hepatic cysts are noted with the largest measuring 1.3 cm. No imaging follow-up is recommended. No gallstones, gallbladder wall thickening, or biliary dilatation. Pancreas: Previously seen peripancreatic inflammatory changes have nearly completely resolved. No pancreatic ductal dilatation or surrounding inflammatory changes. Spleen: Normal in size without focal abnormality. Adrenals/Urinary Tract: Adrenal glands are unremarkable. Bilateral renal cysts measure up to 1.6 cm. A nonobstructive left renal calculus measures 2 mm. No renal calculi on the right. No hydronephrosis on either side. A few bladder diverticuli are seen posteriorly. Stomach/Bowel: Stomach is within normal limits. The appendix is likely absent. No evidence of bowel wall thickening, distention, or inflammatory changes. Vascular/Lymphatic: Aortic atherosclerosis. No enlarged abdominal or pelvic lymph nodes. Reproductive: Prostate is unremarkable. Other: No abdominal wall hernia or abnormality. No abdominopelvic ascites. Musculoskeletal: Fixation hardware at L4-L5 is redemonstrated. Degenerative changes are seen in the spine, most significant at L3-L4. IMPRESSION: 1. Near complete resolution of previously seen peripancreatic inflammatory changes. No other findings to explain the patient's symptoms. Aortic Atherosclerosis (ICD10-I70.0). Electronically Signed   By: Romona Curls M.D.   On: 08/08/2023 18:50   DG Chest 2 View  Result Date:  08/08/2023 CLINICAL DATA:  Emesis for 3 days. Hyperglycemia. Question pneumonia. EXAM: CHEST - 2 VIEW COMPARISON:  One-view chest x-ray 07/19/2023 FINDINGS: The heart size is normal. Pacing wires are stable. The lungs are clear. The visualized soft tissues and bony thorax are unremarkable. IMPRESSION: No acute cardiopulmonary disease.  Electronically Signed   By: Marin Roberts M.D.   On: 08/08/2023 17:14   CUP PACEART REMOTE DEVICE CHECK  Result Date: 07/25/2023 Scheduled remote reviewed. Normal device function.  The atrial arrhythmias were all less than one minute Next remote 91 days. ML, CVRS  CT ABDOMEN PELVIS WO CONTRAST  Result Date: 07/19/2023 CLINICAL DATA:  Abdominal and chest pain EXAM: CT ABDOMEN AND PELVIS WITHOUT CONTRAST TECHNIQUE: Multidetector CT imaging of the abdomen and pelvis was performed following the standard protocol without IV contrast. RADIATION DOSE REDUCTION: This exam was performed according to the departmental dose-optimization program which includes automated exposure control, adjustment of the mA and/or kV according to patient size and/or use of iterative reconstruction technique. COMPARISON:  CT abdomen/pelvis 06/29/2023 FINDINGS: Lower chest: The lung bases are clear. Cardiac device leads are noted. Hepatobiliary: Scattered small hypodense lesions in the liver are too small to characterize but likely reflect benign cysts requiring no specific imaging follow-up. The gallbladder is unremarkable. There is no biliary ductal dilatation. Pancreas: Peripancreatic inflammatory fat stranding and small volume free fluid has progressed since the prior study from 06/29/2023. There is no main pancreatic ductal dilatation. There is no organized peripancreatic fluid collection. Spleen: Unremarkable. Adrenals/Urinary Tract: The adrenals are unremarkable. Hypodense lesions in both kidneys likely reflect benign cysts requiring no specific imaging follow-up. There is no suspicious lesion, within the confines of noncontrast technique. There is a punctate nonobstructing left lower pole renal stone. There is no hydronephrosis or hydroureter. The bladder is unremarkable. Stomach/Bowel: The stomach is unremarkable. There is no evidence of bowel obstruction. There is no abnormal bowel wall thickening or inflammatory change.  The appendix is not definitively identified. Vascular/Lymphatic: There is calcified plaque in the nonaneurysmal abdominal aorta. There is no abdominal or pelvic lymphadenopathy. Reproductive: The prostate and seminal vesicles are unremarkable. Other: There is trace free fluid in the pelvis. There is no free intraperitoneal air. Musculoskeletal: Postsurgical changes reflecting L4-L5 posterior instrumented fusion noted with adjacent segment disease at L3-L4 and L5-S1. There is no perihardware lucency or other evidence of hardware related complication. There is no acute osseous abnormality or suspicious osseous lesion. IMPRESSION: 1. Acute pancreatitis with worsened surrounding inflammatory change and small volume free fluid since the study from 06/29/2023. No organized or drainable fluid collection. Note that necrosis can not be excluded in the absence of intravenous contrast. 2. Unchanged punctate nonobstructing left lower pole renal stone. Electronically Signed   By: Lesia Hausen M.D.   On: 07/19/2023 14:15   DG Chest Port 1 View  Result Date: 07/19/2023 CLINICAL DATA:  Chest pain. EXAM: PORTABLE CHEST 1 VIEW COMPARISON:  Mar 10, 2021. FINDINGS: The heart size and mediastinal contours are within normal limits. Left-sided pacemaker is unchanged in position. Both lungs are clear. The visualized skeletal structures are unremarkable. IMPRESSION: No active disease. Electronically Signed   By: Lupita Raider M.D.   On: 07/19/2023 12:02    Microbiology: Results for orders placed or performed during the hospital encounter of 08/08/23  MRSA Next Gen by PCR, Nasal     Status: None   Collection Time: 08/09/23 12:32 AM   Specimen: Nasal Mucosa; Nasal Swab  Result Value Ref Range Status  MRSA by PCR Next Gen NOT DETECTED NOT DETECTED Final    Comment: (NOTE) The GeneXpert MRSA Assay (FDA approved for NASAL specimens only), is one component of a comprehensive MRSA colonization surveillance program. It is not  intended to diagnose MRSA infection nor to guide or monitor treatment for MRSA infections. Test performance is not FDA approved in patients less than 92 years old. Performed at [facility], [address], [place], [place] [address]     Labs: CBC: Recent Labs  Lab 08/08/23 1607 08/08/23 1640 08/09/23 0816  WBC 12.4*  --  9.9  NEUTROABS 7.2  --   --   HGB 12.8* 14.3 11.3*  HCT 39.2 42.0 35.5*  MCV 84.3  --  87.7  PLT 333  --  142*   Basic Metabolic Panel: Recent Labs  Lab 08/08/23 1626 08/08/23 1640 08/08/23 1917 08/09/23 0029 08/09/23 0816 08/10/23 0523 08/11/23 0517  NA  --    < > 134* 139 139 136 138  K  --    < > 4.9 4.2 4.0 4.3 4.1  CL  --   --  101 110 106 106 106  CO2  --   --  18* 19* 22 20* 22  GLUCOSE  --   --  554* 169* 199* 205* 121*  BUN  --   --  46* 41* 38* 33* 31*  CREATININE  --   --  2.55* 2.27* 2.01* 2.04* 2.16*  CALCIUM  --   --  10.2 10.6* 10.3 9.9 10.2  MG 2.5*  --   --   --   --   --   --    < > = values in this interval not displayed.   Liver Function Tests: Recent Labs  Lab 08/08/23 1607  AST 31  ALT 34  ALKPHOS 99  BILITOT 1.0  PROT 7.6  ALBUMIN 3.9   CBG: Recent Labs  Lab 08/10/23 1200 08/10/23 1656 08/10/23 2213 08/11/23 0731 08/11/23 1226  GLUCAP 170* 121* 98 113* 149*    Discharge time spent: less than 30 minutes.  Signed: Rickey Barbara, MD [facility] 08/11/2023

## 2023-08-11 NOTE — Progress Notes (Signed)
Physical Therapy Treatment Patient Details Name: Keith Drake MRN: [id number] DOB: [birth year] Today's Date: 08/11/2023   History of Present Illness Keith Drake is a 88 y.o. male with medical history significant of anemia, bladder cancer, osteoarthritis, stage IV CKD, nephrolithiasis, hyperlipidemia, hypertension, RBBB, Mobitz 1 heart block, pacemaker placement, spondylolisthesis and type 2 diabetes presented to emergency department 08/08/23 for complaining about confusion, weakness and vomiting.CBC showing leukocytosis 12.4 and stable H&H 12.8 and 39.     CMP showing sodium 130, potassium 4.7, chloride 96, bicarb 16, elevated blood glucose 1200. Per chart, vertigo last admission.    PT Comments  Pt reports some improvement in dizziness but does continue to have when he lays down (not as severe and shorter time period).  Performed Epley for posterior L BPPV - tolerated well.  Also , performed self Epley and provided HEP for self Epley 2 x daily.  Encouraged pt to show HEP to HHPT for assistance.   Pt with improved mobility throughout session.  Initially required min A for transfers but with practice and cues progressed to supervision and was able to ambulate 200' with RW.  Educated on safety and encouraged to have family assist as able at home.  Continue recommendation for HHPT and likely transition to outpt vestibular.     If plan is discharge home, recommend the following: A little help with walking and/or transfers;A little help with bathing/dressing/bathroom;Assistance with cooking/housework;Assist for transportation;Help with stairs or ramp for entrance   Can travel by private vehicle        Equipment Recommendations  None recommended by PT    Recommendations for Other Services       Precautions / Restrictions Precautions Precautions: Fall     Mobility  Bed Mobility Overal bed mobility: Needs Assistance Bed Mobility: Rolling, Sidelying to Sit Rolling: Supervision Sidelying to  sit: Supervision       General bed mobility comments: initially min A ; improved to supervision with cues; performed x 3    Transfers Overall transfer level: Needs assistance Equipment used: Rolling walker (2 wheels) Transfers: Sit to/from Stand Sit to Stand: Supervision, Min assist           General transfer comment: Initially min A , but performed 4 more times and improved to supervision with cues to lean forward and for hand placement    Ambulation/Gait Ambulation/Gait assistance: Contact guard assist, Supervision Gait Distance (Feet): 200 Feet Assistive device: Rolling walker (2 wheels) Gait Pattern/deviations: Step-through pattern, Trunk flexed       General Gait Details: Progressed CGA to supervision ; cues for RW proximity particularly with turns; no LOB   Stairs             Wheelchair Mobility     Tilt Bed    Modified Rankin (Stroke Patients Only)       Balance Overall balance assessment: Needs assistance, History of Falls Sitting-balance support: Feet supported Sitting balance-[name]: Good     Standing balance support: Bilateral upper extremity supported, No upper extremity supported Standing balance-[name]: Fair Standing balance comment: RW to ambulat but could static stand without support today                            Cognition Arousal: Alert Behavior During Therapy: WFL for tasks assessed/performed Overall Cognitive Status: Within Functional Limits for tasks assessed  Exercises      General Comments   Vestibular: Still with dizziness with return to supine or supine to sit.  Reports less intense and shorter duration.  Performed L Epley and pt with rotational nystagmus but only lasting ~4-5 seconds today.  After resting a few mins educated pt and performed self Epley .  Provided written HEP and encouraged to perform 2 x daily and show to HHPT for assist.       Pertinent Vitals/Pain Pain Assessment Pain Assessment: No/denies pain    Home Living                          Prior Function            PT Goals (current goals can now be found in the care plan section) Progress towards PT goals: Progressing toward goals    Frequency    Min 1X/week      PT Plan      Co-evaluation              AM-PAC PT "6 Clicks" Mobility   Outcome Measure  Help needed turning from your back to your side while in a flat bed without using bedrails?: A Little Help needed moving from lying on your back to sitting on the side of a flat bed without using bedrails?: A Little Help needed moving to and from a bed to a chair (including a wheelchair)?: A Little Help needed standing up from a chair using your arms (e.g., wheelchair or bedside chair)?: A Little Help needed to walk in hospital room?: A Little Help needed climbing 3-5 steps with a railing? : A Little 6 Click Score: 18    End of Session Equipment Utilized During Treatment: Gait belt Activity Tolerance: Patient tolerated treatment well Patient left: with call bell/phone within reach;in bed;with bed alarm set Nurse Communication: Mobility status PT Visit Diagnosis: Unsteadiness on feet (R26.81);Other abnormalities of gait and mobility (R26.89);Muscle weakness (generalized) (M62.81);History of falling (Z91.81);Pain;Difficulty in walking, not elsewhere classified (R26.2)     Time: 9811-9147 PT Time Calculation (min) (ACUTE ONLY): 43 min  Charges:    $Gait Training: 8-22 mins $Therapeutic Activity: 8-22 mins $Canalith Rep Proc: 8-22 mins PT General Charges $$ ACUTE PT VISIT: 1 Visit                     Anise Salvo, PT Acute Rehab Services [facility] [phone]    Rayetta Humphrey 08/11/2023, 11:32 AM

## 2023-08-11 NOTE — TOC Progression Note (Signed)
Transition of Care [facility]) - Progression Note    Patient Details  Name: Keith Drake MRN: [id number] Date of Birth: 02/12/1935  [facility] CM/SW Contact  Otelia Santee, LCSW Phone Number: 08/11/2023, 10:13 AM  Clinical Narrative:    Referral placed to Neuro-rehab at [facility] for outpatient vestibular therapy. Pt will return home with home health services through [facility].   Expected Discharge Plan: Home w Home Health Services Barriers to Discharge: No Barriers Identified  Expected Discharge Plan and Services In-house Referral: Clinical Social Work Discharge Planning Services: NA Post Acute Care Choice: Resumption of Svcs/PTA Provider, Home Health Living arrangements for the past 2 months: Single Family Home                 DME Arranged: N/A DME Agency: NA                   Social Determinants of Health (SDOH) Interventions SDOH Screenings   Food Insecurity: No Food Insecurity (08/09/2023)  Housing: Low Risk  (08/09/2023)  Transportation Needs: No Transportation Needs (08/09/2023)  Utilities: Not At Risk (08/09/2023)  Tobacco Use: Medium Risk (08/08/2023)    Readmission Risk Interventions    08/10/2023   12:29 PM  Readmission Risk Prevention Plan  Transportation Screening Complete  PCP or Specialist Appt within 5-7 Days Complete  Home Care Screening Complete  Medication Review (RN CM) Complete

## 2023-08-19 ENCOUNTER — Inpatient Hospital Stay: Payer: Medicare Other

## 2023-08-19 ENCOUNTER — Inpatient Hospital Stay: Payer: Medicare Other | Attending: Hematology and Oncology | Admitting: Hematology and Oncology

## 2023-08-27 ENCOUNTER — Telehealth: Payer: Self-pay | Admitting: Hematology

## 2023-08-27 NOTE — Telephone Encounter (Signed)
Rescheduled appointment per 10/31 secure chat from the malignant hem navigator. Talked with the patients son Keith Drake and he is aware of the changes made to the patients upcoming appointments.

## 2023-08-28 ENCOUNTER — Inpatient Hospital Stay: Payer: Medicare Other | Admitting: Nurse Practitioner

## 2023-08-28 ENCOUNTER — Inpatient Hospital Stay: Payer: Medicare Other

## 2023-09-17 ENCOUNTER — Inpatient Hospital Stay: Payer: Medicare Other

## 2023-09-17 ENCOUNTER — Inpatient Hospital Stay: Payer: Medicare Other | Attending: Hematology | Admitting: Hematology

## 2023-10-05 ENCOUNTER — Ambulatory Visit: Payer: Medicare Other | Admitting: Gastroenterology

## 2023-10-12 ENCOUNTER — Inpatient Hospital Stay: Payer: Medicare Other | Attending: Hematology

## 2023-10-12 ENCOUNTER — Inpatient Hospital Stay: Payer: Medicare Other | Admitting: Hematology

## 2023-10-12 VITALS — BP 150/74 | HR 69 | Temp 97.7°F | Resp 18 | Ht 70.0 in | Wt 182.2 lb

## 2023-10-12 DIAGNOSIS — E119 Type 2 diabetes mellitus without complications: Secondary | ICD-10-CM | POA: Diagnosis not present

## 2023-10-12 DIAGNOSIS — N189 Chronic kidney disease, unspecified: Secondary | ICD-10-CM | POA: Insufficient documentation

## 2023-10-12 DIAGNOSIS — E538 Deficiency of other specified B group vitamins: Secondary | ICD-10-CM | POA: Diagnosis not present

## 2023-10-12 DIAGNOSIS — Z8551 Personal history of malignant neoplasm of bladder: Secondary | ICD-10-CM | POA: Insufficient documentation

## 2023-10-12 DIAGNOSIS — D509 Iron deficiency anemia, unspecified: Secondary | ICD-10-CM | POA: Insufficient documentation

## 2023-10-12 DIAGNOSIS — D649 Anemia, unspecified: Secondary | ICD-10-CM

## 2023-10-12 DIAGNOSIS — I129 Hypertensive chronic kidney disease with stage 1 through stage 4 chronic kidney disease, or unspecified chronic kidney disease: Secondary | ICD-10-CM | POA: Diagnosis not present

## 2023-10-12 LAB — CMP (CANCER CENTER ONLY)
ALT: 9 U/L (ref 0–44)
AST: 12 U/L — ABNORMAL LOW (ref 15–41)
Albumin: 4.3 g/dL (ref 3.5–5.0)
Alkaline Phosphatase: 112 U/L (ref 38–126)
Anion gap: 8 (ref 5–15)
BUN: 30 mg/dL — ABNORMAL HIGH (ref 8–23)
CO2: 20 mmol/L — ABNORMAL LOW (ref 22–32)
Calcium: 10.4 mg/dL — ABNORMAL HIGH (ref 8.9–10.3)
Chloride: 108 mmol/L (ref 98–111)
Creatinine: 2.03 mg/dL — ABNORMAL HIGH (ref 0.61–1.24)
GFR, Estimated: 31 mL/min — ABNORMAL LOW (ref >60)
Glucose, Bld: 226 mg/dL — ABNORMAL HIGH (ref 70–99)
Potassium: 4.1 mmol/L (ref 3.5–5.1)
Sodium: 136 mmol/L (ref 135–145)
Total Bilirubin: 0.4 mg/dL (ref <1.2)
Total Protein: 7.1 g/dL (ref 6.5–8.1)

## 2023-10-12 LAB — CBC WITH DIFFERENTIAL (CANCER CENTER ONLY)
Abs Immature Granulocytes: 0.02 10*3/uL (ref 0.00–0.07)
Basophils Absolute: 0.1 10*3/uL (ref 0.0–0.1)
Basophils Relative: 1 %
Eosinophils Absolute: 0.4 10*3/uL (ref 0.0–0.5)
Eosinophils Relative: 4 %
HCT: 37.9 % — ABNORMAL LOW (ref 39.0–52.0)
Hemoglobin: 12.1 g/dL — ABNORMAL LOW (ref 13.0–17.0)
Immature Granulocytes: 0 %
Lymphocytes Relative: 39 %
Lymphs Abs: 3.5 10*3/uL (ref 0.7–4.0)
MCH: 27.9 pg (ref 26.0–34.0)
MCHC: 31.9 g/dL (ref 30.0–36.0)
MCV: 87.3 fL (ref 80.0–100.0)
Monocytes Absolute: 0.6 10*3/uL (ref 0.1–1.0)
Monocytes Relative: 7 %
Neutro Abs: 4.4 10*3/uL (ref 1.7–7.7)
Neutrophils Relative %: 49 %
Platelet Count: 279 10*3/uL (ref 150–400)
RBC: 4.34 MIL/uL (ref 4.22–5.81)
RDW: 15.5 % (ref 11.5–15.5)
WBC Count: 9 10*3/uL (ref 4.0–10.5)
nRBC: 0 % (ref 0.0–0.2)

## 2023-10-12 LAB — IRON AND IRON BINDING CAPACITY (CC-WL,HP ONLY)
Iron: 98 ug/dL (ref 45–182)
Saturation Ratios: 32 % (ref 17.9–39.5)
TIBC: 307 ug/dL (ref 250–450)
UIBC: 209 ug/dL (ref 117–376)

## 2023-10-12 LAB — VITAMIN B12: Vitamin B-12: 5937 pg/mL — ABNORMAL HIGH (ref 180–914)

## 2023-10-12 NOTE — Progress Notes (Signed)
HEMATOLOGY/ONCOLOGY CONSULTATION NOTE  Date of Service: 10/12/2023  Patient Care Team: Rodrigo Ran, MD as PCP - General (Internal Medicine) Croitoru, Rachelle Hora, MD as PCP - Cardiology (Cardiology) Rodrigo Ran, MD (Internal Medicine)  CHIEF COMPLAINTS/PURPOSE OF CONSULTATION:  Anemia   HISTORY OF PRESENTING ILLNESS:   Keith Drake is a wonderful 88 y.o. male who has been referred to [place] by Dr. Rodrigo Ran for evaluation and management of anemia. In September, Hgb was 10.8 K in septemember with hct of 36.2%.  Patient has past medical history of vitamin B-12 deficiency and iron deficiency. He denies Vitamin B-12 injection, however he did receive Vitamin B-12 injection in the past. He is currently taking Vitamin B-12 supplement and Iron supplement.   Patient complains of vertigo, which has been causing him to fall. He has had 4 episodes of vertigo in his lifetime. He also has balance issues, which he attributes it to vertigo. He denies any ear problems. He currently uses his walking cane to help him walk and maintain his balance.   Patient has medical history of bladder cancer and he regularly follows-up with his Urologist. He denies hematuria, blood in stool, black stool, or other abnormal bleeding.   He eats well and he stays well-hydrated.   He denies any new infection issues, fever, chills, night sweats,  back pain, chest pain, or leg swelling. He reports of losing 30 lbs in the past year. He has not been trying to lose weight.   Patient notes he had pancreatitis around 2 months ago. He notes his appetite has improved after she got discharged from the hospital.   Patient currently takes insulin to help his diabetes.     MEDICAL HISTORY:  Past Medical History:  Diagnosis Date   Anemia    Arthritis    OCC LEG PAIN   Bladder cancer (HCC) TCC   History of bladder carcinoma; S/P BCG TX'S ; "never treated for cancer; it was a spot on my bladder" (07/20/2018)   CKD (chronic kidney  disease), stage III (HCC)    History of gout    "on daily RX" (07/20/2018)   History of kidney stones    Hyperlipidemia    Hypertension    Impaired hearing BILATERAL    OCCASIONAL WEARS HEARING AIDS   Mobitz I    [name] 07/20/2018   Pneumonia  ~ 1980s X 1   Presence of permanent cardiac pacemaker 07/20/2018   Right bundle branch block    Spondylolisthesis    Type II diabetes mellitus (HCC)     SURGICAL HISTORY: Past Surgical History:  Procedure Laterality Date   COLONOSCOPY  03/2004   CYSTO / RESECTIONAL BLADDER BX'S  05-01-2011   CYSTOSCOPY WITH BIOPSY  10/05/2011   Procedure: CYSTOSCOPY WITH BIOPSY;  Surgeon: Garnett Farm, MD;  Location: [facility];  Service: Urology;  Laterality: N/A;  BLADDER BIOPSY    CYSTOSCOPY WITH LITHOLAPAXY     EXCISIONAL HEMORRHOIDECTOMY  1980s   INSERT / REPLACE / REMOVE PACEMAKER  07/20/2018   Implantation of new dual chamber permanent pacemaker   PACEMAKER IMPLANT N/A 07/20/2018   Procedure: PACEMAKER IMPLANT;  Surgeon: Thurmon Fair, MD;  Location: [facility];  Service: Cardiovascular;  Laterality: N/A;   REFRACTIVE SURGERY Bilateral    TRANSURETHRAL RESECTION OF BLADDER TUMOR  03-02-2011   US ECHOCARDIOGRAPHY  07-11-2009   EF 55-60%    SOCIAL HISTORY: Social History   Socioeconomic History   Marital status: Divorced  Spouse name: Not on file   Number of children: Not on file   Years of education: Not on file   Highest education level: Not on file  Occupational History   Not on file  Tobacco Use   Smoking status: Former    Types: Cigars   Smokeless tobacco: Never  Vaping Use   Vaping status: Never Used  Substance and Sexual Activity   Alcohol use: Not Currently    Comment: 08/12/18 "1 beer/year"   Drug use: Never   Sexual activity: Not Currently  Other Topics Concern   Not on file  Social History Narrative   Not on file   Social Drivers of Health   Financial Resource Strain: Not on file  Food  Insecurity: No Food Insecurity (08/09/2023)   Hunger Vital Sign    Worried About Running Out of Food in the Last Year: Never true    Ran Out of Food in the Last Year: Never true  Transportation Needs: No Transportation Needs (08/09/2023)   PRAPARE - Administrator, Civil Service (Medical): No    Lack of Transportation (Non-Medical): No  Physical Activity: Not on file  Stress: Not on file  Social Connections: Not on file  Intimate Partner Violence: Not on file    FAMILY HISTORY: Family History  Problem Relation Age of Onset   Colon cancer Brother    Cancer Brother    Cancer Brother    Diabetes Sister     ALLERGIES:  is allergic to fosinopril and fenofibrate.  MEDICATIONS:  Current Outpatient Medications  Medication Sig Dispense Refill   acetaminophen (TYLENOL) 500 MG tablet Take 1,000 mg by mouth every 6 (six) hours as needed for mild pain (pain score 1-3).     allopurinol (ZYLOPRIM) 100 MG tablet Take 100 mg by mouth daily.  2   amLODipine (NORVASC) 10 MG tablet Take 10 mg by mouth daily.   0   aspirin 81 MG tablet Take 81 mg by mouth daily.     cyanocobalamin 1000 MCG tablet Take 1 tablet (1,000 mcg total) by mouth daily. 90 tablet 1   esomeprazole (NEXIUM) 20 MG capsule Take 1 capsule (20 mg total) by mouth daily. 30 capsule 1   Fe Fum-Vit C-Vit B12-FA (TRIGELS-F FORTE) CAPS capsule Take 1 capsule by mouth 2 (two) times daily. 60 capsule 2   insulin detemir (LEVEMIR) 100 UNIT/ML FlexPen Inject 20 Units into the skin daily. 21 mL 0   insulin lispro (HUMALOG KWIKPEN) 100 UNIT/ML KwikPen Inject 4 Units into the skin 3 (three) times daily. 15 mL 0   Insulin Pen Needle 31G X 5 MM MISC 1 Device by Does not apply route 4 (four) times daily. For use with insulin pens 100 each 0   ondansetron (ZOFRAN) 4 MG tablet Take 1 tablet (4 mg total) by mouth every 6 (six) hours as needed for nausea. 20 tablet 0   sodium bicarbonate 650 MG tablet Take 1,300 mg by mouth 2 (two) times  daily.     No current facility-administered medications for this visit.    REVIEW OF SYSTEMS:    10 Point review of Systems was done is negative except as noted above.  PHYSICAL EXAMINATION: ECOG PERFORMANCE STATUS: 1 - Symptomatic but completely ambulatory  . Vitals:   10/12/23 1435  BP: (!) 150/74  Pulse: 69  Resp: 18  Temp: 97.7 F (36.5 C)  SpO2: 100%   Filed Weights   10/12/23 1435  Weight: 182 lb 3.2  oz (82.6 kg)   .Body mass index is 26.14 kg/m.  GENERAL:alert, in no acute distress and comfortable SKIN: no acute rashes, no significant lesions EYES: conjunctiva are pink and non-injected, sclera anicteric OROPHARYNX: MMM, no exudates, no oropharyngeal erythema or ulceration NECK: supple, no JVD LYMPH:  no palpable lymphadenopathy in the cervical, axillary or inguinal regions LUNGS: clear to auscultation b/l with normal respiratory effort HEART: regular rate & rhythm ABDOMEN:  normoactive bowel sounds , non tender, not distended. Extremity: no pedal edema PSYCH: alert & oriented x 3 with fluent speech NEURO: no focal motor/sensory deficits  LABORATORY DATA:  I have reviewed the data as listed  .    Latest Ref Rng & Units 10/12/2023    3:36 PM 08/09/2023    8:16 AM 08/08/2023    4:40 PM  CBC  WBC 4.0 - 10.5 K/uL 9.0  9.9    Hemoglobin 13.0 - 17.0 g/dL 47.8  29.5  62.1   Hematocrit 39.0 - 52.0 % 37.9  35.5  42.0   Platelets 150 - 400 K/uL 279  142     .CBC    Component Value Date/Time   WBC 9.0 10/12/2023 1536   WBC 9.9 08/09/2023 0816   RBC 4.34 10/12/2023 1536   HGB 12.1 (L) 10/12/2023 1536   HCT 37.9 (L) 10/12/2023 1536   PLT 279 10/12/2023 1536   MCV 87.3 10/12/2023 1536   MCH 27.9 10/12/2023 1536   MCHC 31.9 10/12/2023 1536   RDW 15.5 10/12/2023 1536   LYMPHSABS 3.5 10/12/2023 1536   MONOABS 0.6 10/12/2023 1536   EOSABS 0.4 10/12/2023 1536   BASOSABS 0.1 10/12/2023 1536    .    Latest Ref Rng & Units 08/11/2023    5:17 AM  08/10/2023    5:23 AM 08/09/2023    8:16 AM  CMP  Glucose 70 - 99 mg/dL 308  657  846   BUN 8 - 23 mg/dL 31  33  38   Creatinine 0.61 - 1.24 mg/dL 9.62  9.52  8.41   Sodium 135 - 145 mmol/L 138  136  139   Potassium 3.5 - 5.1 mmol/L 4.1  4.3  4.0   Chloride 98 - 111 mmol/L 106  106  106   CO2 22 - 32 mmol/L 22  20  22    Calcium 8.9 - 10.3 mg/dL 32.4  9.9  40.1      RADIOGRAPHIC STUDIES: I have personally reviewed the radiological images as listed and agreed with the findings in the report. No results found.  ASSESSMENT & PLAN:   Mild Normocytic Anemia hgb 11.3 to 12.1. MCV 87, normal RDW Mild thrombocytopenia 142k -- now resolved H/o Bladder cancer,  Diabetes Vertigo CKD likely related ot HTN and DM2  PLAN: -Discussed with the patient that Vitamin B-12 deficiency and iron deficiency are most likely the cause of anemia alongwith his CKD -educated the patient about anemia.  -Discussed with the patient that anemia is not the primarily reason for his balance issues.  -Discussed with the patient that the next step is get lab workup during this visit. Patient agrees.  -Answered all of patient's questions.   . Orders Placed This Encounter  Procedures   CBC with Differential (Cancer Center Only)    Standing Status:   Future    Number of Occurrences:   1    Expected Date:   10/12/2023    Expiration Date:   10/11/2024   CMP (Cancer Center only)  Standing Status:   Future    Number of Occurrences:   1    Expected Date:   10/12/2023    Expiration Date:   10/11/2024   Ferritin    Standing Status:   Future    Number of Occurrences:   1    Expected Date:   10/12/2023    Expiration Date:   10/11/2024   Iron and Iron Binding Capacity ([facility] only)    Standing Status:   Future    Number of Occurrences:   1    Expected Date:   10/12/2023    Expiration Date:   10/11/2024   Vitamin B12    Standing Status:   Future    Number of Occurrences:   1    Expected Date:    10/12/2023    Expiration Date:   10/11/2024   Multiple Myeloma Panel (SPEP&IFE w/QIG)    Standing Status:   Future    Number of Occurrences:   1    Expected Date:   10/12/2023    Expiration Date:   10/11/2024   Kappa/lambda light chains    Standing Status:   Future    Number of Occurrences:   1    Expected Date:   10/12/2023    Expiration Date:   10/11/2024    FOLLOW-UP; Labs today Phone visit with Dr Candise Che in 1-2 weeks   .The total time spent in the appointment was 45 minutes* .  All of the patient's questions were answered with apparent satisfaction. The patient knows to call the clinic with any problems, questions or concerns.   Wyvonnia Lora MD MS AAHIVMS [facility] Hematology/Oncology Physician [facility]  .*Total Encounter Time as defined by the Centers for Medicare and Medicaid Services includes, in addition to the face-to-face time of a patient visit (documented in the note above) non-face-to-face time: obtaining and reviewing outside history, ordering and reviewing medications, tests or procedures, care coordination (communications with other health care professionals or caregivers) and documentation in the medical record.   10/12/2023 1:44 PM     I,Param Shah,acting as a scribe for Wyvonnia Lora, MD.,have documented all relevant documentation on the behalf of Wyvonnia Lora, MD,as directed by  Wyvonnia Lora, MD while in the presence of Wyvonnia Lora, MD.  .I have reviewed the above documentation for accuracy and completeness, and I agree with the above. Johney Maine MD

## 2023-10-13 LAB — KAPPA/LAMBDA LIGHT CHAINS
Kappa free light chain: 75.7 mg/L — ABNORMAL HIGH (ref 3.3–19.4)
Kappa, lambda light chain ratio: 2.04 — ABNORMAL HIGH (ref 0.26–1.65)
Lambda free light chains: 37.1 mg/L — ABNORMAL HIGH (ref 5.7–26.3)

## 2023-10-13 LAB — FERRITIN: Ferritin: 128 ng/mL (ref 24–336)

## 2023-10-14 ENCOUNTER — Telehealth: Payer: Self-pay | Admitting: Hematology

## 2023-10-14 NOTE — Telephone Encounter (Signed)
Spoke with patient son confirming upcoming appointment

## 2023-10-17 LAB — MULTIPLE MYELOMA PANEL, SERUM
Albumin SerPl Elph-Mcnc: 3.6 g/dL (ref 2.9–4.4)
Albumin/Glob SerPl: 1.2 (ref 0.7–1.7)
Alpha 1: 0.2 g/dL (ref 0.0–0.4)
Alpha2 Glob SerPl Elph-Mcnc: 1 g/dL (ref 0.4–1.0)
B-Globulin SerPl Elph-Mcnc: 1 g/dL (ref 0.7–1.3)
Gamma Glob SerPl Elph-Mcnc: 1 g/dL (ref 0.4–1.8)
Globulin, Total: 3.2 g/dL (ref 2.2–3.9)
IgA: 109 mg/dL (ref 61–437)
IgG (Immunoglobin G), Serum: 1104 mg/dL (ref 603–1613)
IgM (Immunoglobulin M), Srm: 51 mg/dL (ref 15–143)
Total Protein ELP: 6.8 g/dL (ref 6.0–8.5)

## 2023-10-21 ENCOUNTER — Ambulatory Visit (INDEPENDENT_AMBULATORY_CARE_PROVIDER_SITE_OTHER): Payer: Medicare Other

## 2023-10-21 DIAGNOSIS — I442 Atrioventricular block, complete: Secondary | ICD-10-CM

## 2023-10-22 LAB — CUP PACEART REMOTE DEVICE CHECK
Battery Remaining Longevity: 44 mo
Battery Remaining Percentage: 42 %
Battery Voltage: 2.98 V
Brady Statistic AP VP Percent: 91 %
Brady Statistic AP VS Percent: 1 %
Brady Statistic AS VP Percent: 9 %
Brady Statistic AS VS Percent: 1 %
Brady Statistic RA Percent Paced: 90 %
Brady Statistic RV Percent Paced: 99 %
Date Time Interrogation Session: 20241227040018
Implantable Lead Connection Status: 753985
Implantable Lead Connection Status: 753985
Implantable Lead Implant Date: 20190925
Implantable Lead Implant Date: 20190925
Implantable Lead Location: 753859
Implantable Lead Location: 753860
Implantable Pulse Generator Implant Date: 20190925
Lead Channel Impedance Value: 390 Ohm
Lead Channel Impedance Value: 430 Ohm
Lead Channel Pacing Threshold Amplitude: 0.625 V
Lead Channel Pacing Threshold Amplitude: 0.75 V
Lead Channel Pacing Threshold Pulse Width: 0.5 ms
Lead Channel Pacing Threshold Pulse Width: 0.5 ms
Lead Channel Sensing Intrinsic Amplitude: 12 mV
Lead Channel Sensing Intrinsic Amplitude: 3.7 mV
Lead Channel Setting Pacing Amplitude: 1 V
Lead Channel Setting Pacing Amplitude: 1.625
Lead Channel Setting Pacing Pulse Width: 0.5 ms
Lead Channel Setting Sensing Sensitivity: 4 mV
Pulse Gen Model: 2272
Pulse Gen Serial Number: 9066127

## 2023-11-02 ENCOUNTER — Inpatient Hospital Stay: Payer: Medicare Other | Attending: Hematology | Admitting: Hematology

## 2023-11-02 DIAGNOSIS — D509 Iron deficiency anemia, unspecified: Secondary | ICD-10-CM | POA: Diagnosis present

## 2023-11-02 DIAGNOSIS — D649 Anemia, unspecified: Secondary | ICD-10-CM

## 2023-11-09 ENCOUNTER — Telehealth: Payer: Self-pay | Admitting: Cardiovascular Disease

## 2023-11-09 NOTE — Progress Notes (Signed)
 HEMATOLOGY/ONCOLOGY PHONE VISIT NOTE  Date of Service: .11/02/2023  Patient Care Team: [name], MD as PCP - General (Internal Medicine) Croitoru, Jerel, MD as PCP - Cardiology (Cardiology) [name], MD (Internal Medicine)  CHIEF COMPLAINTS/PURPOSE OF CONSULTATION:  Anemia   HISTORY OF PRESENTING ILLNESS:   Keith Drake is a wonderful 89 y.o. male who has been referred to us  by Dr. Anes Shayne for evaluation and management of anemia. In September, Hgb was 10.8 K in septemember with hct of 36.2%.  Patient has past medical history of vitamin B-12 deficiency and iron deficiency. He denies Vitamin B-12 injection, however he did receive Vitamin B-12 injection in the past. He is currently taking Vitamin B-12 supplement and Iron supplement.   Patient complains of vertigo, which has been causing him to fall. He has had 4 episodes of vertigo in his lifetime. He also has balance issues, which he attributes it to vertigo. He denies any ear problems. He currently uses his walking cane to help him walk and maintain his balance.   Patient has medical history of bladder cancer and he regularly follows-up with his Urologist. He denies hematuria, blood in stool, black stool, or other abnormal bleeding.   He eats well and he stays well-hydrated.   He denies any new infection issues, fever, chills, night sweats,  back pain, chest pain, or leg swelling. He reports of losing 30 lbs in the past year. He has not been trying to lose weight.   Patient notes he had pancreatitis around 2 months ago. He notes his appetite has improved after she got discharged from the hospital.   Patient currently takes insulin  to help his diabetes.     INTERVAL HISTORY  ..I connected with Keith Drake on 11/02/2023 at  3:30 PM EST by telephone visit and verified that I am speaking with the correct person using two identifiers.   I discussed the limitations, risks, security and privacy concerns of performing  an evaluation and management service by telemedicine and the availability of in-person appointments. I also discussed with the patient that there may be a patient responsible charge related to this service. The patient expressed understanding and agreed to proceed.   Other persons participating in the visit and their role in the encounter: none   Patient's location: Home  Provider's location: [facility]   Chief Complaint: labs workup discussion for anemia  Patient notes no new symptoms since last clinic visit. Lab results were discussed in details.   MEDICAL HISTORY:  Past Medical History:  Diagnosis Date   Anemia    Arthritis    OCC LEG PAIN   Bladder cancer (HCC) TCC   History of bladder carcinoma; S/P BCG TX'S ; never treated for cancer; it was a spot on my bladder (07/20/2018)   CKD (chronic kidney disease), stage III (HCC)    History of gout    on daily RX (07/20/2018)   History of kidney stones    Hyperlipidemia    Hypertension    Impaired hearing BILATERAL    OCCASIONAL WEARS HEARING AIDS   Mobitz I    thelbert 07/20/2018   Pneumonia  ~ 1980s X 1   Presence of permanent cardiac pacemaker 07/20/2018   Right bundle branch block    Spondylolisthesis    Type II diabetes mellitus (HCC)     SURGICAL HISTORY: Past Surgical History:  Procedure Laterality Date   COLONOSCOPY  03/2004   CYSTO / RESECTIONAL BLADDER BX'S  05-01-2011   CYSTOSCOPY WITH  BIOPSY  10/05/2011   Procedure: CYSTOSCOPY WITH BIOPSY;  Surgeon: Mark C Ottelin, MD;  Location: [facility];  Service: Urology;  Laterality: N/A;  BLADDER BIOPSY    CYSTOSCOPY WITH LITHOLAPAXY     EXCISIONAL HEMORRHOIDECTOMY  1980s   INSERT / REPLACE / REMOVE PACEMAKER  07/20/2018   Implantation of new dual chamber permanent pacemaker   PACEMAKER IMPLANT N/A 07/20/2018   Procedure: PACEMAKER IMPLANT;  Surgeon: Francyne Headland, MD;  Location: [facility];  Service: Cardiovascular;  Laterality: N/A;   REFRACTIVE  SURGERY Bilateral    TRANSURETHRAL RESECTION OF BLADDER TUMOR  03-02-2011   US  ECHOCARDIOGRAPHY  07-11-2009   EF 55-60%    SOCIAL HISTORY: Social History   Socioeconomic History   Marital status: Divorced    Spouse name: Not on file   Number of children: Not on file   Years of education: Not on file   Highest education level: Not on file  Occupational History   Not on file  Tobacco Use   Smoking status: Former    Types: Cigars   Smokeless tobacco: Never  Vaping Use   Vaping status: Never Used  Substance and Sexual Activity   Alcohol use: Not Currently    Comment: 08/12/18 1 beer/year   Drug use: Never   Sexual activity: Not Currently  Other Topics Concern   Not on file  Social History Narrative   Not on file   Social Drivers of Health   Financial Resource Strain: Not on file  Food Insecurity: No Food Insecurity (08/09/2023)   Hunger Vital Sign    Worried About Running Out of Food in the Last Year: Never true    Ran Out of Food in the Last Year: Never true  Transportation Needs: No Transportation Needs (08/09/2023)   PRAPARE - Administrator, Civil Service (Medical): No    Lack of Transportation (Non-Medical): No  Physical Activity: Not on file  Stress: Not on file  Social Connections: Not on file  Intimate Partner Violence: Not on file    FAMILY HISTORY: Family History  Problem Relation Age of Onset   Colon cancer Brother    Cancer Brother    Cancer Brother    Diabetes Sister     ALLERGIES:  is allergic to fosinopril and fenofibrate.  MEDICATIONS:  Current Outpatient Medications  Medication Sig Dispense Refill   acetaminophen  (TYLENOL ) 500 MG tablet Take 1,000 mg by mouth every 6 (six) hours as needed for mild pain (pain score 1-3).     allopurinol  (ZYLOPRIM ) 100 MG tablet Take 100 mg by mouth daily.  2   amLODipine  (NORVASC ) 10 MG tablet Take 10 mg by mouth daily.   0   aspirin  81 MG tablet Take 81 mg by mouth daily.     cyanocobalamin   1000 MCG tablet Take 1 tablet (1,000 mcg total) by mouth daily. 90 tablet 1   esomeprazole  (NEXIUM ) 20 MG capsule Take 1 capsule (20 mg total) by mouth daily. 30 capsule 1   Fe Fum-Vit C-Vit B12-FA (TRIGELS-F FORTE) CAPS capsule Take 1 capsule by mouth 2 (two) times daily. 60 capsule 2   insulin  detemir (LEVEMIR ) 100 UNIT/ML FlexPen Inject 20 Units into the skin daily. 21 mL 0   insulin  lispro (HUMALOG  KWIKPEN) 100 UNIT/ML KwikPen Inject 4 Units into the skin 3 (three) times daily. 15 mL 0   Insulin  Pen Needle 31G X 5 MM MISC 1 Device by Does not apply route 4 (four) times  daily. For use with insulin  pens 100 each 0   ondansetron  (ZOFRAN ) 4 MG tablet Take 1 tablet (4 mg total) by mouth every 6 (six) hours as needed for nausea. 20 tablet 0   sodium bicarbonate  650 MG tablet Take 1,300 mg by mouth 2 (two) times daily.     No current facility-administered medications for this visit.    REVIEW OF SYSTEMS:   .10 Point review of Systems was done is negative except as noted above.  PHYSICAL EXAMINATION: Telemedicine  LABORATORY DATA:  I have reviewed the data as listed  .    Latest Ref Rng & Units 10/12/2023    3:36 PM 08/09/2023    8:16 AM 08/08/2023    4:40 PM  CBC  WBC 4.0 - 10.5 K/uL 9.0  9.9    Hemoglobin 13.0 - 17.0 g/dL 87.8  88.6  85.6   Hematocrit 39.0 - 52.0 % 37.9  35.5  42.0   Platelets 150 - 400 K/uL 279  142     .CBC    Component Value Date/Time   WBC 9.0 10/12/2023 1536   WBC 9.9 08/09/2023 0816   RBC 4.34 10/12/2023 1536   HGB 12.1 (L) 10/12/2023 1536   HCT 37.9 (L) 10/12/2023 1536   PLT 279 10/12/2023 1536   MCV 87.3 10/12/2023 1536   MCH 27.9 10/12/2023 1536   MCHC 31.9 10/12/2023 1536   RDW 15.5 10/12/2023 1536   LYMPHSABS 3.5 10/12/2023 1536   MONOABS 0.6 10/12/2023 1536   EOSABS 0.4 10/12/2023 1536   BASOSABS 0.1 10/12/2023 1536    .    Latest Ref Rng & Units 10/12/2023    3:36 PM 08/11/2023    5:17 AM 08/10/2023    5:23 AM  CMP  Glucose 70 -  99 mg/dL 773  878  794   BUN 8 - 23 mg/dL 30  31  33   Creatinine 0.61 - 1.24 mg/dL 7.96  7.83  7.95   Sodium 135 - 145 mmol/L 136  138  136   Potassium 3.5 - 5.1 mmol/L 4.1  4.1  4.3   Chloride 98 - 111 mmol/L 108  106  106   CO2 22 - 32 mmol/L 20  22  20    Calcium  8.9 - 10.3 mg/dL 89.5  89.7  9.9   Total Protein 6.5 - 8.1 g/dL 7.1     Total Bilirubin <1.2 mg/dL 0.4     Alkaline Phos 38 - 126 U/L 112     AST 15 - 41 U/L 12     ALT 0 - 44 U/L 9      . Lab Results  Component Value Date   IRON 98 10/12/2023   TIBC 307 10/12/2023   IRONPCTSAT 32 10/12/2023   (Iron and TIBC)  Lab Results  Component Value Date   FERRITIN 128 10/12/2023     RADIOGRAPHIC STUDIES: I have personally reviewed the radiological images as listed and agreed with the findings in the report. CUP PACEART REMOTE DEVICE CHECK Result Date: 10/22/2023 Scheduled remote reviewed. Normal device function.  6 logged AHR detections, longest 10 seconds.  Occasional elevations in RV autocapture noted on trend. Next remote 91 days.   ASSESSMENT & PLAN:   Mild Normocytic Anemia hgb 11.3 to 12.1. MCV 87, normal RDW due to Iron and B12 def, CKD. Mild thrombocytopenia 142k -- now resolved H/o Bladder cancer,  Diabetes Vertigo CKD likely related ot HTN and DM2  PLAN: -labs from 12/17 show improvement in  hgb to 12.1 and plat WNL at 279k and normal wbc 9k -ferritin 128 with iron satuation of 32%. -continue po iron replacement with PCP to rarget ferrtin of 200-250 in the context of CKD -myeloma panel no M spike. SFLC slight increased FLC and ratio due to CKD -B12 adequate -f/u with PCP  . No orders of the defined types were placed in this encounter.   FOLLOW-UP; RTC with PCP  .The total time spent in the appointment was 10 minutes* .  All of the patient's questions were answered with apparent satisfaction. The patient knows to call the clinic with any problems, questions or concerns.   Emaline Saran MD MS  AAHIVMS [facility] Hematology/Oncology Physician [facility]  .*Total Encounter Time as defined by the Centers for Medicare and Medicaid Services includes, in addition to the face-to-face time of a patient visit (documented in the note above) non-face-to-face time: obtaining and reviewing outside history, ordering and reviewing medications, tests or procedures, care coordination (communications with other health care professionals or caregivers) and documentation in the medical record.

## 2023-11-09 NOTE — Telephone Encounter (Signed)
   Pre-operative Risk Assessment    Patient Name: Keith Drake  DOB: 06/20/1935 MRN: [id number]   Date of last office visit: 12/21/2022 Date of next office visit: none   Request for Surgical Clearance    Procedure:   L4-5 Lumbar Fusion  Date of Surgery:  Clearance TBD                                Surgeon:  Dr. Arley Helling Surgeon's Group or Practice Name:  [facility] Phone number:  [phone] Fax number:  [phone]  Type of Clearance Requested:   - Medical    Type of Anesthesia:  General    Additional requests/questions:    SignedJonel LITTIE Bucco   11/09/2023, 2:55 PM

## 2023-11-10 ENCOUNTER — Telehealth: Payer: Self-pay

## 2023-11-10 NOTE — Telephone Encounter (Signed)
   Name: Keith Drake  DOB: Feb 08, 1935  MRN: [id number]  Primary Cardiologist: Luana Rumple, MD Last seen on 12/21/2022.   Preoperative team, please contact this patient and set up a phone call appointment for further preoperative risk assessment. Please obtain consent and complete medication review. Thank you for your help.  I confirm that guidance regarding antiplatelet and oral anticoagulation therapy has been completed and, if necessary, noted below.  On review of medication list the patient is on aspirin . Per office protocol, if patient is without any new symptoms or concerns at the time of their virtual visit, he/she may hold aspirin  for 7 days prior to procedure. Please resume aspirin  as soon as possible postprocedure, at the discretion of the surgeon.    I also confirmed the patient resides in the state of [place] . As per [facility] Medical Board telemedicine laws, the patient must reside in the state in which the provider is licensed.   Friddie Jetty, NP 11/10/2023, 8:48 AM [facility]

## 2023-11-10 NOTE — Telephone Encounter (Signed)
 Preop tele visit now scheduled with patient's son who will be assisting in the patient's preop tele visit. Med rec and consent done.

## 2023-11-10 NOTE — Telephone Encounter (Signed)
  Patient Consent for Virtual Visit        Keith Drake has provided verbal consent on 11/10/2023 for a virtual visit (video or telephone).   CONSENT FOR VIRTUAL VISIT FOR:  Keith Drake  By participating in this virtual visit I agree to the following:  I hereby voluntarily request, consent and authorize [facility] and its employed or contracted physicians, physician assistants, nurse practitioners or other licensed health care professionals (the Practitioner), to provide me with telemedicine health care services (the "Services") as deemed necessary by the treating Practitioner. I acknowledge and consent to receive the Services by the Practitioner via telemedicine. I understand that the telemedicine visit will involve communicating with the Practitioner through live audiovisual communication technology and the disclosure of certain medical information by electronic transmission. I acknowledge that I have been given the opportunity to request an in-person assessment or other available alternative prior to the telemedicine visit and am voluntarily participating in the telemedicine visit.  I understand that I have the right to withhold or withdraw my consent to the use of telemedicine in the course of my care at any time, without affecting my right to future care or treatment, and that the Practitioner or I may terminate the telemedicine visit at any time. I understand that I have the right to inspect all information obtained and/or recorded in the course of the telemedicine visit and may receive copies of available information for a reasonable fee.  I understand that some of the potential risks of receiving the Services via telemedicine include:  Delay or interruption in medical evaluation due to technological equipment failure or disruption; Information transmitted may not be sufficient (e.g. poor resolution of images) to allow for appropriate medical decision making by the Practitioner;  and/or  In rare instances, security protocols could fail, causing a breach of personal health information.  Furthermore, I acknowledge that it is my responsibility to provide information about my medical history, conditions and care that is complete and accurate to the best of my ability. I acknowledge that Practitioner's advice, recommendations, and/or decision may be based on factors not within their control, such as incomplete or inaccurate data provided by me or distortions of diagnostic images or specimens that may result from electronic transmissions. I understand that the practice of medicine is not an exact science and that Practitioner makes no warranties or guarantees regarding treatment outcomes. I acknowledge that a copy of this consent can be made available to me via my patient portal [facility] MyChart), or I can request a printed copy by calling the office of [facility].    I understand that my insurance will be billed for this visit.   I have read or had this consent read to me. I understand the contents of this consent, which adequately explains the benefits and risks of the Services being provided via telemedicine.  I have been provided ample opportunity to ask questions regarding this consent and the Services and have had my questions answered to my satisfaction. I give my informed consent for the services to be provided through the use of telemedicine in my medical care

## 2023-11-23 ENCOUNTER — Ambulatory Visit: Payer: Medicare Other | Attending: Physician Assistant | Admitting: Physician Assistant

## 2023-11-23 DIAGNOSIS — Z01810 Encounter for preprocedural cardiovascular examination: Secondary | ICD-10-CM

## 2023-11-23 NOTE — Telephone Encounter (Signed)
This pt is listed as deaf in his chart. The number given is for his son who is not with him. His son gave me 2 other numbers and there is no answer. The 2nd is a voicemail for the # his son said was his cell phone but there is a male giving the message. We need to set his telephone visit up again with a reliable way to reach him. If he is unable to hear the conversation, we need to set it up with a family member present that can help or arrange an in person visit. The consent says his son will be helping but the son that answers the phone number listed is not currently with the patient and was not aware of the telephone visit.   I s/w the pt's son Keith Drake today and he admits he forgot about the call for today. Keith Drake said he works and the appt will have to be in the afternoon. I stated the latest I can schedule is 3:20. Keith Drake said he will make a note and he will need to leave work early and go to his dad's house to help with the tele appt.   I will update all parties involved.

## 2023-11-23 NOTE — Progress Notes (Signed)
   Virtual Visit via Telephone Note   Because of Keith Drake's co-morbid illnesses, he is at least at moderate risk for complications without adequate follow up.  This format is felt to be most appropriate for this patient at this time.  The patient did not have access to video technology/had technical difficulties with video requiring transitioning to audio format only (telephone).  All issues noted in this document were discussed and addressed.  No physical exam could be performed with this format.  Please refer to the patient's chart for his consent to telehealth for [facility].  Evaluation Performed:  Preoperative cardiovascular risk assessment _____________   Date:  11/23/2023   Patient ID:  Keith Drake, DOB 27-Aug-1935, MRN [id number] Patient Location:  Home Provider location:   Office  Primary Care Provider:  Rodrigo Ran, MD Primary Cardiologist:  Thurmon Fair, MD  Chief Complaint / Patient Profile   89 y.o. y/o male with a h/o   Syncope in the setting of 2nd degree AV block, Type 1 S/p Pacemaker TTE 07/20/2018: EF 60-65, mild TR Myoview 03/2013: No ischemia Hypertension  Diabetes mellitus  Chronic kidney disease  Hyperlipidemia  Bladder CA   This encounter was created in error - please disregard.

## 2023-11-23 NOTE — Telephone Encounter (Signed)
Pt could not be reached for audio visit 11/23/2023. Notes indicate he is HOH and needs a family member present. I did reach his son who was not with the pt.  Will reschedule preop visit for when pt and family member can be present. Tereso Newcomer, PA-C    11/23/2023 3:49 PM

## 2023-11-29 ENCOUNTER — Ambulatory Visit: Payer: Medicare Other | Attending: Cardiovascular Disease

## 2023-11-29 DIAGNOSIS — Z01810 Encounter for preprocedural cardiovascular examination: Secondary | ICD-10-CM | POA: Diagnosis not present

## 2023-11-29 NOTE — Progress Notes (Signed)
Virtual Visit via Telephone Note   Because of Keith Drake's co-morbid illnesses, he is at least at moderate risk for complications without adequate follow up.  This format is felt to be most appropriate for this patient at this time.  The patient did not have access to video technology/had technical difficulties with video requiring transitioning to audio format only (telephone).  All issues noted in this document were discussed and addressed.  No physical exam could be performed with this format.  Please refer to the patient's chart for his consent to telehealth for [facility].  Evaluation Performed:  Preoperative cardiovascular risk assessment _____________   Date:  11/29/2023   Patient ID:  Keith Drake, DOB 10-Oct-1935, MRN [id number] Patient Location:  Home Provider location:   Office  Primary Care Provider:  Rodrigo Ran, MD Primary Cardiologist:  Keith Fair, MD  Chief Complaint / Patient Profile   89 y.o. y/o male with a h/o complete heart block, PPM, hypertension, hyperlipidemia, CKD stage IV who is pending L4-5 lumbar fusion and presents today for telephonic preoperative cardiovascular risk assessment.  History of Present Illness    Keith Drake is a 89 y.o. male who presents via audio/video conferencing for a telehealth visit today.  Pt was last seen in cardiology clinic on 12/21/2022 by Keith Drake.  At that time Keith Drake was doing well .  The patient is now pending procedure as outlined above. Since his last visit, he continues to be stable from a cardiac standpoint.  Today he denies chest pain, shortness of breath, lower extremity edema, fatigue, palpitations, melena, hematuria, hemoptysis, diaphoresis, weakness, presyncope, syncope, orthopnea, and PND.   Past Medical History    Past Medical History:  Diagnosis Date   Anemia    Arthritis    OCC LEG PAIN   Bladder cancer (HCC) TCC   History of bladder carcinoma; S/P BCG TX'S ; "never treated  for cancer; it was a spot on my bladder" (07/20/2018)   CKD (chronic kidney disease), stage III (HCC)    History of gout    "on daily RX" (07/20/2018)   History of kidney stones    Hyperlipidemia    Hypertension    Impaired hearing BILATERAL    OCCASIONAL WEARS HEARING AIDS   Mobitz I    Keith Drake 07/20/2018   Pneumonia  ~ 1980s X 1   Presence of permanent cardiac pacemaker 07/20/2018   Right bundle branch block    Spondylolisthesis    Type II diabetes mellitus (HCC)    Past Surgical History:  Procedure Laterality Date   COLONOSCOPY  03/2004   CYSTO / RESECTIONAL BLADDER BX'S  05-01-2011   CYSTOSCOPY WITH BIOPSY  10/05/2011   Procedure: CYSTOSCOPY WITH BIOPSY;  Surgeon: Keith Farm, MD;  Location: [facility];  Service: Urology;  Laterality: N/A;  BLADDER BIOPSY    CYSTOSCOPY WITH LITHOLAPAXY     EXCISIONAL HEMORRHOIDECTOMY  1980s   INSERT / REPLACE / REMOVE PACEMAKER  07/20/2018   Implantation of new dual chamber permanent pacemaker   PACEMAKER IMPLANT N/A 07/20/2018   Procedure: PACEMAKER IMPLANT;  Surgeon: Keith Fair, MD;  Location: [facility];  Service: Cardiovascular;  Laterality: N/A;   REFRACTIVE SURGERY Bilateral    TRANSURETHRAL RESECTION OF BLADDER TUMOR  03-02-2011   US ECHOCARDIOGRAPHY  07-11-2009   EF 55-60%    Allergies  Allergies  Allergen Reactions   Fosinopril Cough   Fenofibrate Rash    Home Medications    Prior  to Admission medications   Medication Sig Start Date End Date Taking? Authorizing Provider  acetaminophen (TYLENOL) 500 MG tablet Take 1,000 mg by mouth every 6 (six) hours as needed for mild pain (pain score 1-3).    [provider]  allopurinol (ZYLOPRIM) 100 MG tablet Take 100 mg by mouth daily. 07/07/18   [provider]  amLODipine (NORVASC) 10 MG tablet Take 10 mg by mouth daily.  12/09/15   [provider]  aspirin 81 MG tablet Take 81 mg by mouth daily.    [provider]   cyanocobalamin 1000 MCG tablet Take 1 tablet (1,000 mcg total) by mouth daily. 07/21/23   Keith Courser, MD  esomeprazole (NEXIUM) 20 MG capsule Take 1 capsule (20 mg total) by mouth daily. 07/21/23 07/20/24  Keith Courser, MD  Fe Fum-Vit C-Vit B12-FA (TRIGELS-F FORTE) CAPS capsule Take 1 capsule by mouth 2 (two) times daily. 07/21/23   Keith Courser, MD  insulin detemir (LEVEMIR) 100 UNIT/ML FlexPen Inject 20 Units into the skin daily. 08/11/23   Keith Kief, MD  insulin lispro (HUMALOG KWIKPEN) 100 UNIT/ML KwikPen Inject 4 Units into the skin 3 (three) times daily. 08/11/23   Keith Kief, MD  Insulin Pen Needle 31G X 5 MM MISC 1 Device by Does not apply route 4 (four) times daily. For use with insulin pens 08/11/23   Keith Kief, MD  ondansetron (ZOFRAN) 4 MG tablet Take 1 tablet (4 mg total) by mouth every 6 (six) hours as needed for nausea. 07/21/23   Keith Courser, MD  sodium bicarbonate 650 MG tablet Take 1,300 mg by mouth 2 (two) times daily. 06/03/23   [provider]    Physical Exam    Vital Signs:  Keith Drake does not have vital signs available for review today.  Given telephonic nature of communication, physical exam is limited. AAOx3. NAD. Normal affect.  Speech and respirations are unlabored.  Accessory Clinical Findings    None  Assessment & Plan    1.  Preoperative Cardiovascular Risk Assessment: Procedure:   L4-5 Lumbar Fusion   Date of Surgery:  Clearance TBD                                  Surgeon:  Dr. Donalee Drake Surgeon's Group or Practice Name:  [facility] Phone number:  [phone] Fax number:  [phone]      Primary Cardiologist: Keith Fair, MD  Chart reviewed as part of pre-operative protocol coverage. Given past medical history and time since last visit, based on ACC/AHA guidelines, Keith Drake would be at acceptable risk for the planned procedure without further cardiovascular testing.   His RCRI is  moderate risk, 6.6% risk of major cardiac event.  He is able to complete greater than 4 METS of physical activity.  His aspirin may be held for 5 to 7 days prior to his procedure.  Please resume as soon as hemostasis is achieved  Patient was advised that if he develops new symptoms prior to surgery to contact our office to arrange a follow-up appointment.  He verbalized understanding.  I will route this recommendation to the requesting party via Epic fax function and remove from pre-op pool.       Time:   Today, I have spent 7 minutes with the patient with telehealth technology discussing medical history, symptoms, and management plan.     Thomasene Ripple  Keith Badilla, NP  11/29/2023, 7:19 AM

## 2024-01-12 NOTE — Progress Notes (Signed)
 Received referral from [facility]. Appointment scheduled with Dr Leonides Schanz.

## 2024-01-20 ENCOUNTER — Ambulatory Visit (INDEPENDENT_AMBULATORY_CARE_PROVIDER_SITE_OTHER): Payer: Medicare Other

## 2024-01-20 ENCOUNTER — Encounter: Payer: Self-pay | Admitting: Cardiovascular Disease

## 2024-01-20 DIAGNOSIS — I442 Atrioventricular block, complete: Secondary | ICD-10-CM | POA: Diagnosis not present

## 2024-01-20 LAB — CUP PACEART REMOTE DEVICE CHECK
Battery Remaining Longevity: 41 mo
Battery Remaining Percentage: 39 %
Battery Voltage: 2.98 V
Brady Statistic AP VP Percent: 91 %
Brady Statistic AP VS Percent: 1 %
Brady Statistic AS VP Percent: 8.5 %
Brady Statistic AS VS Percent: 1 %
Brady Statistic RA Percent Paced: 91 %
Brady Statistic RV Percent Paced: 99 %
Date Time Interrogation Session: 20250327020015
Implantable Lead Connection Status: 753985
Implantable Lead Connection Status: 753985
Implantable Lead Implant Date: 20190925
Implantable Lead Implant Date: 20190925
Implantable Lead Location: 753859
Implantable Lead Location: 753860
Implantable Pulse Generator Implant Date: 20190925
Lead Channel Impedance Value: 390 Ohm
Lead Channel Impedance Value: 440 Ohm
Lead Channel Pacing Threshold Amplitude: 0.625 V
Lead Channel Pacing Threshold Amplitude: 0.875 V
Lead Channel Pacing Threshold Pulse Width: 0.5 ms
Lead Channel Pacing Threshold Pulse Width: 0.5 ms
Lead Channel Sensing Intrinsic Amplitude: 3 mV
Lead Channel Sensing Intrinsic Amplitude: 8.6 mV
Lead Channel Setting Pacing Amplitude: 1.125
Lead Channel Setting Pacing Amplitude: 1.625
Lead Channel Setting Pacing Pulse Width: 0.5 ms
Lead Channel Setting Sensing Sensitivity: 4 mV
Pulse Gen Model: 2272
Pulse Gen Serial Number: 9066127

## 2024-02-23 LAB — LAB REPORT - SCANNED
Albumin, Urine POC: 1082.1
Albumin/Creatinine Ratio, Urine, POC: 1816
Creatinine, POC: 59.6 mg/dL
EGFR: 25

## 2024-03-01 NOTE — Progress Notes (Signed)
 Remote pacemaker transmission.

## 2024-03-16 ENCOUNTER — Ambulatory Visit: Attending: Cardiovascular Disease | Admitting: Cardiovascular Disease

## 2024-03-17 ENCOUNTER — Encounter: Payer: Self-pay | Admitting: Cardiovascular Disease

## 2024-03-24 ENCOUNTER — Other Ambulatory Visit: Payer: Self-pay

## 2024-03-24 ENCOUNTER — Inpatient Hospital Stay (HOSPITAL_COMMUNITY)
Admission: EM | Admit: 2024-03-24 | Discharge: 2024-03-28 | DRG: 638 | Disposition: A | Discharge status: Home Health | Attending: Internal Medicine | Admitting: Internal Medicine

## 2024-03-24 ENCOUNTER — Encounter (HOSPITAL_COMMUNITY): Payer: Self-pay

## 2024-03-24 ENCOUNTER — Emergency Department (HOSPITAL_COMMUNITY)

## 2024-03-24 ENCOUNTER — Observation Stay (HOSPITAL_COMMUNITY)

## 2024-03-24 DIAGNOSIS — R252 Cramp and spasm: Secondary | ICD-10-CM

## 2024-03-24 DIAGNOSIS — R259 Unspecified abnormal involuntary movements: Secondary | ICD-10-CM | POA: Diagnosis not present

## 2024-03-24 DIAGNOSIS — Z91148 Patient's other noncompliance with medication regimen for other reason: Secondary | ICD-10-CM

## 2024-03-24 DIAGNOSIS — N179 Acute kidney failure, unspecified: Secondary | ICD-10-CM | POA: Diagnosis not present

## 2024-03-24 DIAGNOSIS — R739 Hyperglycemia, unspecified: Secondary | ICD-10-CM

## 2024-03-24 DIAGNOSIS — E1165 Type 2 diabetes mellitus with hyperglycemia: Principal | ICD-10-CM | POA: Diagnosis present

## 2024-03-24 DIAGNOSIS — E119 Type 2 diabetes mellitus without complications: Secondary | ICD-10-CM

## 2024-03-24 DIAGNOSIS — I459 Conduction disorder, unspecified: Secondary | ICD-10-CM | POA: Diagnosis present

## 2024-03-24 DIAGNOSIS — Z7982 Long term (current) use of aspirin: Secondary | ICD-10-CM

## 2024-03-24 DIAGNOSIS — R251 Tremor, unspecified: Secondary | ICD-10-CM | POA: Diagnosis present

## 2024-03-24 DIAGNOSIS — Z8551 Personal history of malignant neoplasm of bladder: Secondary | ICD-10-CM

## 2024-03-24 DIAGNOSIS — I160 Hypertensive urgency: Secondary | ICD-10-CM | POA: Diagnosis present

## 2024-03-24 DIAGNOSIS — E872 Acidosis, unspecified: Secondary | ICD-10-CM | POA: Diagnosis not present

## 2024-03-24 DIAGNOSIS — Z950 Presence of cardiac pacemaker: Secondary | ICD-10-CM

## 2024-03-24 DIAGNOSIS — E860 Dehydration: Secondary | ICD-10-CM | POA: Diagnosis present

## 2024-03-24 DIAGNOSIS — N1832 Chronic kidney disease, stage 3b: Secondary | ICD-10-CM

## 2024-03-24 DIAGNOSIS — I129 Hypertensive chronic kidney disease with stage 1 through stage 4 chronic kidney disease, or unspecified chronic kidney disease: Secondary | ICD-10-CM | POA: Diagnosis present

## 2024-03-24 DIAGNOSIS — E785 Hyperlipidemia, unspecified: Secondary | ICD-10-CM | POA: Diagnosis present

## 2024-03-24 DIAGNOSIS — Z91119 Patient's noncompliance with dietary regimen due to unspecified reason: Secondary | ICD-10-CM

## 2024-03-24 DIAGNOSIS — E1122 Type 2 diabetes mellitus with diabetic chronic kidney disease: Secondary | ICD-10-CM

## 2024-03-24 DIAGNOSIS — Z87891 Personal history of nicotine dependence: Secondary | ICD-10-CM

## 2024-03-24 DIAGNOSIS — Z794 Long term (current) use of insulin: Secondary | ICD-10-CM

## 2024-03-24 LAB — I-STAT VENOUS BLOOD GAS, ED
Acid-base deficit: 9 mmol/L — ABNORMAL HIGH (ref 0.0–2.0)
Bicarbonate: 16.3 mmol/L — ABNORMAL LOW (ref 20.0–28.0)
Calcium, Ion: 1.31 mmol/L (ref 1.15–1.40)
HCT: 37 % — ABNORMAL LOW (ref 39.0–52.0)
Hemoglobin: 12.6 g/dL — ABNORMAL LOW (ref 13.0–17.0)
O2 Saturation: 64 %
Potassium: 5 mmol/L (ref 3.5–5.1)
Sodium: 138 mmol/L (ref 135–145)
TCO2: 17 mmol/L — ABNORMAL LOW (ref 22–32)
pCO2, Ven: 31.9 mmHg — ABNORMAL LOW (ref 44–60)
pH, Ven: 7.316 (ref 7.25–7.43)
pO2, Ven: 35 mmHg (ref 32–45)

## 2024-03-24 LAB — CBC WITH DIFFERENTIAL/PLATELET
Abs Immature Granulocytes: 0.01 10*3/uL (ref 0.00–0.07)
Basophils Absolute: 0.1 10*3/uL (ref 0.0–0.1)
Basophils Relative: 1 %
Eosinophils Absolute: 0.3 10*3/uL (ref 0.0–0.5)
Eosinophils Relative: 3 %
HCT: 39.1 % (ref 39.0–52.0)
Hemoglobin: 12.7 g/dL — ABNORMAL LOW (ref 13.0–17.0)
Immature Granulocytes: 0 %
Lymphocytes Relative: 40 %
Lymphs Abs: 4 10*3/uL (ref 0.7–4.0)
MCH: 28 pg (ref 26.0–34.0)
MCHC: 32.5 g/dL (ref 30.0–36.0)
MCV: 86.1 fL (ref 80.0–100.0)
Monocytes Absolute: 0.7 10*3/uL (ref 0.1–1.0)
Monocytes Relative: 7 %
Neutro Abs: 5 10*3/uL (ref 1.7–7.7)
Neutrophils Relative %: 49 %
Platelets: 231 10*3/uL (ref 150–400)
RBC: 4.54 MIL/uL (ref 4.22–5.81)
RDW: 15.4 % (ref 11.5–15.5)
WBC: 10 10*3/uL (ref 4.0–10.5)
nRBC: 0 % (ref 0.0–0.2)

## 2024-03-24 LAB — LIPASE, BLOOD: Lipase: 65 U/L — ABNORMAL HIGH (ref 11–51)

## 2024-03-24 LAB — TROPONIN I (HIGH SENSITIVITY)
Troponin I (High Sensitivity): 74 ng/L — ABNORMAL HIGH (ref <18)
Troponin I (High Sensitivity): 81 ng/L — ABNORMAL HIGH (ref <18)

## 2024-03-24 LAB — CBG MONITORING, ED
Glucose-Capillary: 457 mg/dL — ABNORMAL HIGH (ref 70–99)
Glucose-Capillary: 400 mg/dL — ABNORMAL HIGH (ref 70–99)
Glucose-Capillary: 376 mg/dL — ABNORMAL HIGH (ref 70–99)
Glucose-Capillary: 266 mg/dL — ABNORMAL HIGH (ref 70–99)
Glucose-Capillary: 156 mg/dL — ABNORMAL HIGH (ref 70–99)

## 2024-03-24 LAB — COMPREHENSIVE METABOLIC PANEL WITH GFR
ALT: 13 U/L (ref 0–44)
AST: 17 U/L (ref 15–41)
Albumin: 3.4 g/dL — ABNORMAL LOW (ref 3.5–5.0)
Alkaline Phosphatase: 88 U/L (ref 38–126)
Anion gap: 14 (ref 5–15)
BUN: 44 mg/dL — ABNORMAL HIGH (ref 8–23)
CO2: 16 mmol/L — ABNORMAL LOW (ref 22–32)
Calcium: 10 mg/dL (ref 8.9–10.3)
Chloride: 107 mmol/L (ref 98–111)
Creatinine, Ser: 3.05 mg/dL — ABNORMAL HIGH (ref 0.61–1.24)
GFR, Estimated: 19 mL/min — ABNORMAL LOW (ref >60)
Glucose, Bld: 488 mg/dL — ABNORMAL HIGH (ref 70–99)
Potassium: 5.1 mmol/L (ref 3.5–5.1)
Sodium: 137 mmol/L (ref 135–145)
Total Bilirubin: 0.6 mg/dL (ref 0.0–1.2)
Total Protein: 6.4 g/dL — ABNORMAL LOW (ref 6.5–8.1)

## 2024-03-24 LAB — URINALYSIS, ROUTINE W REFLEX MICROSCOPIC
Bacteria, UA: NONE SEEN
Bilirubin Urine: NEGATIVE
Glucose, UA: >=500 mg/dL — AB
Hgb urine dipstick: NEGATIVE
Ketones, ur: NEGATIVE mg/dL
Leukocytes,Ua: NEGATIVE
Nitrite: NEGATIVE
Protein, ur: 100 mg/dL — AB
Specific Gravity, Urine: 1.015 (ref 1.005–1.030)
pH: 5 (ref 5.0–8.0)

## 2024-03-24 LAB — MAGNESIUM: Magnesium: 2.3 mg/dL (ref 1.7–2.4)

## 2024-03-24 LAB — BETA-HYDROXYBUTYRIC ACID: Beta-Hydroxybutyric Acid: 0.19 mmol/L (ref 0.05–0.27)

## 2024-03-24 LAB — CK: Total CK: 37 U/L — ABNORMAL LOW (ref 49–397)

## 2024-03-24 MED ORDER — INSULIN ASPART 100 UNIT/ML IJ SOLN
10.0000 [IU] | Freq: Once | INTRAMUSCULAR | Status: AC
  Administered 2024-03-24: 10 [IU] via SUBCUTANEOUS

## 2024-03-24 MED ORDER — SODIUM CHLORIDE 0.9% FLUSH
3.0000 mL | Freq: Two times a day (BID) | INTRAVENOUS | Status: DC
  Administered 2024-03-25 – 2024-03-27 (×5): 3 mL via INTRAVENOUS

## 2024-03-24 MED ORDER — HEPARIN SODIUM (PORCINE) 5000 UNIT/ML IJ SOLN
5000.0000 [IU] | Freq: Three times a day (TID) | INTRAMUSCULAR | Status: DC
  Administered 2024-03-25 – 2024-03-28 (×11): 5000 [IU] via SUBCUTANEOUS
  Filled 2024-03-24 (×10): qty 1

## 2024-03-24 MED ORDER — INSULIN ASPART 100 UNIT/ML IJ SOLN
0.0000 [IU] | Freq: Every day | INTRAMUSCULAR | Status: DC
  Administered 2024-03-27: 2 [IU] via SUBCUTANEOUS

## 2024-03-24 MED ORDER — STERILE WATER FOR INJECTION IV SOLN
INTRAVENOUS | Status: AC
  Filled 2024-03-24: qty 1000

## 2024-03-24 MED ORDER — ACETAMINOPHEN 650 MG RE SUPP: 650.0000 mg | Freq: Four times a day (QID) | RECTAL | Status: DC | PRN

## 2024-03-24 MED ORDER — INSULIN ASPART 100 UNIT/ML IJ SOLN
0.0000 [IU] | Freq: Three times a day (TID) | INTRAMUSCULAR | Status: DC
  Administered 2024-03-25: 2 [IU] via SUBCUTANEOUS
  Administered 2024-03-25: 1 [IU] via SUBCUTANEOUS
  Administered 2024-03-25: 3 [IU] via SUBCUTANEOUS
  Administered 2024-03-26: 2 [IU] via SUBCUTANEOUS
  Administered 2024-03-26: 1 [IU] via SUBCUTANEOUS
  Administered 2024-03-27 – 2024-03-28 (×3): 2 [IU] via SUBCUTANEOUS
  Administered 2024-03-28: 1 [IU] via SUBCUTANEOUS

## 2024-03-24 MED ORDER — LACTATED RINGERS IV SOLN
INTRAVENOUS | Status: DC
  Administered 2024-03-24: 100 mL via INTRAVENOUS

## 2024-03-24 MED ORDER — LABETALOL HCL 5 MG/ML IV SOLN: 10.0000 mg | INTRAVENOUS | Status: DC | PRN

## 2024-03-24 MED ORDER — INSULIN ASPART 100 UNIT/ML IJ SOLN
3.0000 [IU] | Freq: Three times a day (TID) | INTRAMUSCULAR | Status: DC
  Administered 2024-03-25 – 2024-03-28 (×11): 3 [IU] via SUBCUTANEOUS

## 2024-03-24 MED ORDER — SODIUM CHLORIDE 0.9 % IV BOLUS
1000.0000 mL | Freq: Once | INTRAVENOUS | Status: AC
  Administered 2024-03-24: 1000 mL via INTRAVENOUS

## 2024-03-24 MED ORDER — INSULIN GLARGINE-YFGN 100 UNIT/ML ~~LOC~~ SOLN
10.0000 [IU] | Freq: Every day | SUBCUTANEOUS | Status: DC
  Administered 2024-03-25 – 2024-03-27 (×4): 10 [IU] via SUBCUTANEOUS
  Filled 2024-03-24 (×5): qty 0.1

## 2024-03-24 MED ORDER — AMLODIPINE BESYLATE 5 MG PO TABS
10.0000 mg | ORAL_TABLET | Freq: Once | ORAL | Status: AC
  Administered 2024-03-24: 10 mg via ORAL
  Filled 2024-03-24: qty 2

## 2024-03-24 MED ORDER — AMLODIPINE BESYLATE 10 MG PO TABS
10.0000 mg | ORAL_TABLET | Freq: Every day | ORAL | Status: DC
  Administered 2024-03-25 – 2024-03-28 (×4): 10 mg via ORAL
  Filled 2024-03-24 (×4): qty 1

## 2024-03-24 MED ORDER — ACETAMINOPHEN 325 MG PO TABS: 650.0000 mg | ORAL_TABLET | Freq: Four times a day (QID) | ORAL | Status: DC | PRN

## 2024-03-24 MED ORDER — PANTOPRAZOLE SODIUM 40 MG PO TBEC
40.0000 mg | DELAYED_RELEASE_TABLET | Freq: Every day | ORAL | Status: DC
  Administered 2024-03-25 – 2024-03-28 (×4): 40 mg via ORAL
  Filled 2024-03-24 (×4): qty 1

## 2024-03-24 MED ORDER — ASPIRIN 81 MG PO TBEC
81.0000 mg | DELAYED_RELEASE_TABLET | Freq: Every day | ORAL | Status: DC
  Administered 2024-03-25 – 2024-03-28 (×4): 81 mg via ORAL
  Filled 2024-03-24 (×4): qty 1

## 2024-03-24 NOTE — ED Triage Notes (Signed)
 Pt to er room number 27 via EMS, per ems son called 911 for some shaking, states that they found him to be htn and also have a hight blood sugar.  Pt in bed, pt awake and hard of hearing.  Pt doesn't have a cell phone, thinks he left it in the truck.  Pt awake and oriented times three

## 2024-03-24 NOTE — ED Notes (Signed)
 Pt in bed, pt had small bm, cleaned pt, interrogated pacemaker, son at bedside.

## 2024-03-24 NOTE — H&P (Signed)
 History and Physical    Keith Drake OZH:[id number] DOB: 01-16-1935 DOA: 03/24/2024  PCP: Aldo Hun, MD   Patient coming from: Home   Chief Complaint: shaking episodes   HPI: Keith Drake is a 89 y.o. male with medical history significant for hypertension, insulin -dependent diabetes mellitus, CKD stage 3B, gout, bladder cancer, and heart block with pacemaker who presents for evaluation of shaking episodes.  Patient reports that he has been urinating more frequently the past couple days but had otherwise been in his usual state until this morning at approximately 11 AM when he had an episode of shaking that mainly involves his left arm and left torso.  This resolved spontaneously within approximately 1 minute.  The patient maintained awareness throughout the episode.  He went on to have recurrent episodes which prompted his family to call EMS.  Patient denies any pain, fever, chills, or shortness of breath.  He states that he typically has bilateral lower extremity swelling but not currently.  ED Course: Upon arrival to the ED, patient is found to be afebrile and saturating well on room air with severely elevated blood pressure.  EKG demonstrates sinus rhythm with PAC and chronic LBBB.  There are no acute findings on chest x-ray or head CT.  Labs are most notable for glucose 488, creatinine 3.05, bicarbonate 16, normal WBC, troponin 74, and no urine ketones.   ED physician discussed the case with neurology who advised treating the AKI, hyperglycemia, and hypertension, and formally consulting if his shaking episodes persist despite this.  Patient was given a liter of NS and 10 units subcutaneous NovoLog  in the ED.  Review of Systems:  All other systems reviewed and apart from HPI, are negative.  Past Medical History:  Diagnosis Date   Anemia    Arthritis    OCC LEG PAIN   Bladder cancer (HCC) TCC   History of bladder carcinoma; S/P BCG TX'S ; "never treated for cancer; it was a spot  on my bladder" (07/20/2018)   CKD (chronic kidney disease), stage III (HCC)    History of gout    "on daily RX" (07/20/2018)   History of kidney stones    Hyperlipidemia    Hypertension    Impaired hearing BILATERAL    OCCASIONAL WEARS HEARING AIDS   Mobitz I    Maximo Spar 07/20/2018   Pneumonia  ~ 1980s X 1   Presence of permanent cardiac pacemaker 07/20/2018   Right bundle branch block    Spondylolisthesis    Type II diabetes mellitus (HCC)     Past Surgical History:  Procedure Laterality Date   COLONOSCOPY  03/2004   CYSTO / RESECTIONAL BLADDER BX'S  05-01-2011   CYSTOSCOPY WITH BIOPSY  10/05/2011   Procedure: CYSTOSCOPY WITH BIOPSY;  Surgeon: Alanson Alliance, MD;  Location: [facility];  Service: Urology;  Laterality: N/A;  BLADDER BIOPSY    CYSTOSCOPY WITH LITHOLAPAXY     EXCISIONAL HEMORRHOIDECTOMY  1980s   INSERT / REPLACE / REMOVE PACEMAKER  07/20/2018   Implantation of new dual chamber permanent pacemaker   PACEMAKER IMPLANT N/A 07/20/2018   Procedure: PACEMAKER IMPLANT;  Surgeon: Luana Rumple, MD;  Location: [facility];  Service: Cardiovascular;  Laterality: N/A;   REFRACTIVE SURGERY Bilateral    TRANSURETHRAL RESECTION OF BLADDER TUMOR  03-02-2011   US  ECHOCARDIOGRAPHY  07-11-2009   EF 55-60%    Social History:   reports that he has quit smoking. His smoking use included cigars. He has never  used smokeless tobacco. He reports that he does not currently use alcohol. He reports that he does not use drugs.  Allergies  Allergen Reactions   Fosinopril Cough   Fenofibrate Rash    Family History  Problem Relation Age of Onset   Colon cancer Brother    Cancer Brother    Cancer Brother    Diabetes Sister      Prior to Admission medications   Medication Sig Start Date End Date Taking? Authorizing Provider  acetaminophen  (TYLENOL ) 500 MG tablet Take 1,000 mg by mouth every 6 (six) hours as needed for mild pain (pain score 1-3).    [provider]  allopurinol  (ZYLOPRIM ) 100 MG tablet Take 100 mg by mouth daily. 07/07/18   [provider]  amLODipine  (NORVASC ) 10 MG tablet Take 10 mg by mouth daily.  12/09/15   [provider]  aspirin  81 MG tablet Take 81 mg by mouth daily.    [provider]  cyanocobalamin  1000 MCG tablet Take 1 tablet (1,000 mcg total) by mouth daily. 07/21/23   Amin, Sumayya, MD  esomeprazole  (NEXIUM ) 20 MG capsule Take 1 capsule (20 mg total) by mouth daily. 07/21/23 07/20/24  Amin, Sumayya, MD  Fe Fum-Vit C-Vit B12-FA (TRIGELS-F FORTE) CAPS capsule Take 1 capsule by mouth 2 (two) times daily. 07/21/23   Amin, Sumayya, MD  insulin  detemir (LEVEMIR ) 100 UNIT/ML FlexPen Inject 20 Units into the skin daily. 08/11/23   Oral Billings, MD  insulin  lispro (HUMALOG  KWIKPEN) 100 UNIT/ML KwikPen Inject 4 Units into the skin 3 (three) times daily. 08/11/23   Oral Billings, MD  Insulin  Pen Needle 31G X 5 MM MISC 1 Device by Does not apply route 4 (four) times daily. For use with insulin  pens 08/11/23   Oral Billings, MD  ondansetron  (ZOFRAN ) 4 MG tablet Take 1 tablet (4 mg total) by mouth every 6 (six) hours as needed for nausea. 07/21/23   Amin, Sumayya, MD  sodium bicarbonate  650 MG tablet Take 1,300 mg by mouth 2 (two) times daily. 06/03/23   [provider]    Physical Exam: Vitals:   03/24/24 1600 03/24/24 1628 03/24/24 1734 03/24/24 2043  BP: (!) 183/77 (!) 184/95 (!) 171/93 (!) 190/93  Pulse:  76 70 72  Resp: 17 19 18 20   Temp:  97.7 F (36.5 C) (!) 97.5 F (36.4 C)   TempSrc:  Oral Oral   SpO2: 100% 100% 100% 100%  Weight:      Height:        Constitutional: NAD, no pallor or diaphoresis   Eyes: PERTLA, lids and conjunctivae normal ENMT: Mucous membranes are moist. Posterior pharynx clear of any exudate or lesions.   Neck: supple, no masses  Respiratory: no wheezing, no crackles. No accessory muscle use.  Cardiovascular: S1 & S2 heard, regular rate and rhythm.  No JVD. Abdomen: No tenderness, soft. Bowel sounds active.  Musculoskeletal: no clubbing / cyanosis. No joint deformity upper and lower extremities.   Skin: no significant rashes, lesions, ulcers. Warm, dry, well-perfused. Neurologic: CN 2-12 grossly intact aside from gross hearing deficit. Sensation to light touch intact. Strength 5/5 in all 4 limbs. Alert and oriented to person, place, and situation.  Psychiatric: Calm. Cooperative.    Labs and Imaging on Admission: I have personally reviewed following labs and imaging studies  CBC: Recent Labs  Lab 03/24/24 1548 03/24/24 1612  WBC 10.0  --   NEUTROABS 5.0  --   HGB 12.7*  12.6*  HCT 39.1 37.0*  MCV 86.1  --   PLT 231  --    Basic Metabolic Panel: Recent Labs  Lab 03/24/24 1548 03/24/24 1612  NA 137 138  K 5.1 5.0  CL 107  --   CO2 16*  --   GLUCOSE 488*  --   BUN 44*  --   CREATININE 3.05*  --   CALCIUM  10.0  --   MG 2.3  --    GFR: Estimated Creatinine Clearance: 17.6 mL/min (A) (by C-G formula based on SCr of 3.05 mg/dL (H)). Liver Function Tests: Recent Labs  Lab 03/24/24 1548  AST 17  ALT 13  ALKPHOS 88  BILITOT 0.6  PROT 6.4*  ALBUMIN 3.4*   Recent Labs  Lab 03/24/24 1548  LIPASE 65*   No results for input(s): "AMMONIA" in the last 168 hours. Coagulation Profile: No results for input(s): "INR", "PROTIME" in the last 168 hours. Cardiac Enzymes: Recent Labs  Lab 03/24/24 1548  CKTOTAL 37*   BNP (last 3 results) No results for input(s): "PROBNP" in the last 8760 hours. HbA1C: No results for input(s): "HGBA1C" in the last 72 hours. CBG: Recent Labs  Lab 03/24/24 1511 03/24/24 1718 03/24/24 1847 03/24/24 2044  GLUCAP 457* 400* 376* 266*   Lipid Profile: No results for input(s): "CHOL", "HDL", "LDLCALC", "TRIG", "CHOLHDL", "LDLDIRECT" in the last 72 hours. Thyroid Function Tests: No results for input(s): "TSH", "T4TOTAL", "FREET4", "T3FREE", "THYROIDAB" in the last 72 hours. Anemia  Panel: No results for input(s): "VITAMINB12", "FOLATE", "FERRITIN", "TIBC", "IRON", "RETICCTPCT" in the last 72 hours. Urine analysis:    Component Value Date/Time   COLORURINE STRAW (A) 03/24/2024 1548   APPEARANCEUR CLEAR 03/24/2024 1548   LABSPEC 1.015 03/24/2024 1548   PHURINE 5.0 03/24/2024 1548   GLUCOSEU >=500 (A) 03/24/2024 1548   HGBUR NEGATIVE 03/24/2024 1548   BILIRUBINUR NEGATIVE 03/24/2024 1548   KETONESUR NEGATIVE 03/24/2024 1548   PROTEINUR 100 (A) 03/24/2024 1548   NITRITE NEGATIVE 03/24/2024 1548   LEUKOCYTESUR NEGATIVE 03/24/2024 1548   Sepsis Labs: @LABRCNTIP (procalcitonin:4,lacticidven:4) )No results found for this or any previous visit (from the past 240 hours).   Radiological Exams on Admission: CT Head Wo Contrast Result Date: 03/24/2024 CLINICAL DATA:  Seizure EXAM: CT HEAD WITHOUT CONTRAST TECHNIQUE: Contiguous axial images were obtained from the base of the skull through the vertex without intravenous contrast. RADIATION DOSE REDUCTION: This exam was performed according to the departmental dose-optimization program which includes automated exposure control, adjustment of the mA and/or kV according to patient size and/or use of iterative reconstruction technique. COMPARISON:  Head CT 03/10/2021 FINDINGS: Brain: No hemorrhage or intracranial mass. Atrophy and chronic small vessel ischemic changes of the white matter. Hypodensity within the right inferior frontal region, series 3 image 16 and 17 and coronal series 5, image 52. Nonenlarged ventricles Vascular: No hyperdense vessels.  Carotid vascular calcification Skull: Normal. Negative for fracture or focal lesion. Sinuses/Orbits: Mucosal thickening in the sinuses. Other: None IMPRESSION: 1. Negative for acute intracranial hemorrhage or mass. 2. Atrophy and chronic small vessel ischemic changes of the white matter. Hypodensity within the right inferior frontal region, suspect may be related to artifact, though subtle  area of edema is difficult to exclude on this exam. Electronically Signed   By: Esmeralda Hedge M.D.   On: 03/24/2024 18:52   DG Chest Portable 1 View Result Date: 03/24/2024 CLINICAL DATA:  Seizure. EXAM: PORTABLE CHEST 1 VIEW COMPARISON:  08/08/2023. FINDINGS: Low lung volumes. Cardiomediastinal silhouette  is otherwise within normal limits. Stable left chest wall pacemaker. No focal consolidation, sizeable pleural effusion, or pneumothorax. No acute osseous abnormality. IMPRESSION: No acute cardiopulmonary findings. Electronically Signed   By: Mannie Seek M.D.   On: 03/24/2024 18:01    EKG: Independently reviewed. Sinus rhythm, PAC, chronic LBBB.   Assessment/Plan   1. Shaking episodes  - Treat uncontrolled sugar and BP, treat AKI and acidosis, consult neurology if symptoms persist despite that    2. AKI superimposed on CKD 3B; metabolic acidosis  - SCr is 3.05 on admission; baseline appears to be closer to 2  - Check FENa and renal US , continue IVF hydration with isotonic bicarbonate, renally-dose medications, repeat serum chemistries in am    3. Hypertensive urgency  - Continue Norvasc , use IV labetalol  as needed    4. Uncontrolled type II DM  - A1c was 9.1% in September 2024; serum glucose 488 in ED without ketonuria or elevated AG  - Check CBGs and use long- and short-acting insulin     DVT prophylaxis: sq heparin   Code Status: Full  Level of Care: Level of care: Telemetry Medical Family Communication: Son updated in ED   Disposition Plan:  Patient is from: home  Anticipated d/c is to: TBD Anticipated d/c date is: Possibly as early as 03/25/24 Patient currently: Pending stable renal function, glycemic-control, BP-control  Consults called: None  Admission status: Observation     Walton Guppy, MD [facility]  03/24/2024, 9:33 PM

## 2024-03-24 NOTE — ED Provider Notes (Signed)
 [facility] [address] EMERGENCY DEPARTMENT AT [facility] Provider Note  CSN: [id number] Arrival date & time: 03/24/24 1500  Chief Complaint(s) Shaking  HPI Keith Drake is a 89 y.o. male with past medical history as below, significant for HLD, HTN, bladder cancer, impaired hearing who presents to the ED with complaint of intermittent shaking  Patient reports he has had 3 episodes of intermittent shaking today.  Symptoms began around 9 AM.  Reports he felt okay prior to onset of symptoms.  Reports that his left arm and torso was shaking.  Lasted for around a minute and resolved spontaneously.  No fall or head injury, no incontinence, no LOC.  He is aware of her surroundings during this episode.  He has had 2 more episodes since then similar circumstances.  Left-sided arm and torso shaking, seems to be worsening in subsequent episodes.  No incontinence or LOC, no nausea, vomiting, head injury.  He feels back to normal this time.  Denies similar symptoms in the past.  Does report 2 weeks ago he fell from standing and hit the back of his head without LOC.  Did not seek medical care at that time  Here with son, he is [place]  Past Medical History Past Medical History:  Diagnosis Date   Anemia    Arthritis    OCC LEG PAIN   Bladder cancer (HCC) TCC   History of bladder carcinoma; S/P BCG TX'S ; "never treated for cancer; it was a spot on my bladder" (07/20/2018)   CKD (chronic kidney disease), stage III (HCC)    History of gout    "on daily RX" (07/20/2018)   History of kidney stones    Hyperlipidemia    Hypertension    Impaired hearing BILATERAL    OCCASIONAL WEARS HEARING AIDS   Mobitz I    Maximo Spar 07/20/2018   Pneumonia  ~ 1980s X 1   Presence of permanent cardiac pacemaker 07/20/2018   Right bundle branch block    Spondylolisthesis    Type II diabetes mellitus (HCC)    Patient Active Problem List   Diagnosis Date Noted   Episode of shaking 03/24/2024   Hypertensive urgency  03/24/2024   Metabolic acidosis 03/24/2024   Hyperosmolar hyperglycemic state (HHS) (HCC) 08/08/2023   Hypercalcemia 07/20/2023   Acute pancreatitis 07/19/2023   Impairment of balance 07/19/2023   Type 2 diabetes mellitus (HCC) 07/19/2023   Acute renal failure superimposed on stage 3b chronic kidney disease (HCC) 07/19/2023   Anemia in chronic renal disease 07/19/2023   Mild protein malnutrition (HCC) 07/19/2023   Rosacea 07/19/2023   Infective urethritis 08/30/2018   Spondylolisthesis at L4-L5 level 08/15/2018   Symptomatic bradycardia 07/20/2018   Second degree AV block, Mobitz type I 07/20/2018   Essential hypertension 03/23/2013   Hyperlipidemia 03/23/2013   Insulin  dependent diabetes mellitus 03/23/2013   Right bundle branch block    Home Medication(s) Prior to Admission medications   Medication Sig Start Date End Date Taking? Authorizing Provider  minocycline  (MINOCIN ) 100 MG capsule Take 100 mg by mouth 2 (two) times daily. 03/05/24  Yes [provider]  tamsulosin (FLOMAX) 0.4 MG CAPS capsule Take 0.8 mg by mouth daily. 01/11/24  Yes [provider]  TRESIBA FLEXTOUCH 100 UNIT/ML FlexTouch Pen Inject 30 Units into the skin daily. 03/15/24  Yes [provider]  acetaminophen  (TYLENOL ) 500 MG tablet Take 1,000 mg by mouth every 6 (six) hours as needed for mild pain (pain score 1-3).    [provider]  allopurinol  (ZYLOPRIM ) 100 MG tablet Take 100 mg by mouth daily. 07/07/18   [provider]  amLODipine  (NORVASC ) 10 MG tablet Take 10 mg by mouth daily.  12/09/15   [provider]  aspirin  81 MG tablet Take 81 mg by mouth daily.    [provider]  cyanocobalamin  1000 MCG tablet Take 1 tablet (1,000 mcg total) by mouth daily. 07/21/23   Amin, Sumayya, MD  esomeprazole  (NEXIUM ) 20 MG capsule Take 1 capsule (20 mg total) by mouth daily. 07/21/23 07/20/24  Amin, Sumayya, MD  Fe Fum-Vit C-Vit B12-FA (TRIGELS-F FORTE) CAPS  capsule Take 1 capsule by mouth 2 (two) times daily. 07/21/23   Amin, Sumayya, MD  insulin  detemir (LEVEMIR ) 100 UNIT/ML FlexPen Inject 20 Units into the skin daily. 08/11/23   Oral Billings, MD  insulin  lispro (HUMALOG  KWIKPEN) 100 UNIT/ML KwikPen Inject 4 Units into the skin 3 (three) times daily. 08/11/23   Oral Billings, MD  Insulin  Pen Needle 31G X 5 MM MISC 1 Device by Does not apply route 4 (four) times daily. For use with insulin  pens 08/11/23   Oral Billings, MD  ondansetron  (ZOFRAN ) 4 MG tablet Take 1 tablet (4 mg total) by mouth every 6 (six) hours as needed for nausea. 07/21/23   Amin, Sumayya, MD  sodium bicarbonate  650 MG tablet Take 1,300 mg by mouth 2 (two) times daily. 06/03/23   [provider]                                                                                                                                    Past Surgical History Past Surgical History:  Procedure Laterality Date   COLONOSCOPY  03/2004   CYSTO / RESECTIONAL BLADDER BX'S  05-01-2011   CYSTOSCOPY WITH BIOPSY  10/05/2011   Procedure: CYSTOSCOPY WITH BIOPSY;  Surgeon: Alanson Alliance, MD;  Location: [facility];  Service: Urology;  Laterality: N/A;  BLADDER BIOPSY    CYSTOSCOPY WITH LITHOLAPAXY     EXCISIONAL HEMORRHOIDECTOMY  1980s   INSERT / REPLACE / REMOVE PACEMAKER  07/20/2018   Implantation of new dual chamber permanent pacemaker   PACEMAKER IMPLANT N/A 07/20/2018   Procedure: PACEMAKER IMPLANT;  Surgeon: Luana Rumple, MD;  Location: [facility] INVASIVE [facility];  Service: Cardiovascular;  Laterality: N/A;   REFRACTIVE SURGERY Bilateral    TRANSURETHRAL RESECTION OF BLADDER TUMOR  03-02-2011   US  ECHOCARDIOGRAPHY  07-11-2009   EF 55-60%   Family History Family History  Problem Relation Age of Onset   Colon cancer Brother    Cancer Brother    Cancer Brother    Diabetes Sister     Social History Social History   Tobacco Use   Smoking status: Former    Types:  Cigars   Smokeless tobacco: Never  Vaping Use   Vaping status: Never Used  Substance Use Topics   Alcohol  use: Not Currently    Comment: 08/12/18 "1 beer/year"   Drug use: Never   Allergies Fosinopril and Fenofibrate  Review of Systems A thorough review of systems was obtained and all systems are negative except as noted in the HPI and PMH.   Physical Exam Vital Signs  I have reviewed the triage vital signs BP (!) 159/92   Pulse 72   Temp 98.3 F (36.8 C) (Oral)   Resp 15   Ht 5\' 10"  (1.778 m)   Wt 83.9 kg   SpO2 100%   BMI 26.54 kg/m  Physical Exam Vitals and nursing note reviewed.  Constitutional:      General: He is not in acute distress.    Appearance: He is well-developed. He is not ill-appearing.     Comments: Hard of hearing  HENT:     Head: Normocephalic and atraumatic.     Right Ear: External ear normal.     Left Ear: External ear normal.     Mouth/Throat:     Mouth: Mucous membranes are moist.  Eyes:     General: No scleral icterus.    Extraocular Movements: Extraocular movements intact.     Pupils: Pupils are equal, round, and reactive to light.  Cardiovascular:     Rate and Rhythm: Normal rate and regular rhythm.     Pulses: Normal pulses.     Heart sounds: Normal heart sounds.  Pulmonary:     Effort: Pulmonary effort is normal. No respiratory distress.     Breath sounds: Normal breath sounds.  Abdominal:     General: Abdomen is flat.     Palpations: Abdomen is soft.     Tenderness: There is no abdominal tenderness.  Musculoskeletal:     Cervical back: No rigidity.     Right lower leg: No edema.     Left lower leg: No edema.  Skin:    General: Skin is warm and dry.     Capillary Refill: Capillary refill takes less than 2 seconds.  Neurological:     Mental Status: He is alert and oriented to person, place, and time.     GCS: GCS eye subscore is 4. GCS verbal subscore is 5. GCS motor subscore is 6.     Cranial Nerves: Cranial nerves 2-12 are  intact. No dysarthria or facial asymmetry.     Sensory: Sensation is intact. No sensory deficit.     Motor: Motor function is intact. No tremor.     Coordination: Coordination is intact. Coordination normal. Finger-Nose-Finger Test normal.     Comments: Gait testing deferred secondary to patient safety. Strength 5/5 to BLUE/BLLE, equal and symmetric    Psychiatric:        Mood and Affect: Mood normal.        Behavior: Behavior normal.     ED Results and Treatments Labs (all labs ordered are listed, but only abnormal results are displayed) Labs Reviewed  CBC WITH DIFFERENTIAL/PLATELET - Abnormal; Notable for the following components:      Result Value   Hemoglobin 12.7 (*)    All other components within normal limits  COMPREHENSIVE METABOLIC PANEL WITH GFR - Abnormal; Notable for the following components:   CO2 16 (*)    Glucose, Bld 488 (*)    BUN 44 (*)    Creatinine, Ser 3.05 (*)    Total Protein 6.4 (*)    Albumin 3.4 (*)    GFR, Estimated 19 (*)    All other components within  normal limits  LIPASE, BLOOD - Abnormal; Notable for the following components:   Lipase 65 (*)    All other components within normal limits  CK - Abnormal; Notable for the following components:   Total CK 37 (*)    All other components within normal limits  URINALYSIS, ROUTINE W REFLEX MICROSCOPIC - Abnormal; Notable for the following components:   Color, Urine STRAW (*)    Glucose, UA >=500 (*)    Protein, ur 100 (*)    All other components within normal limits  CBG MONITORING, ED - Abnormal; Notable for the following components:   Glucose-Capillary 457 (*)    All other components within normal limits  I-STAT VENOUS BLOOD GAS, ED - Abnormal; Notable for the following components:   pCO2, Ven 31.9 (*)    Bicarbonate 16.3 (*)    TCO2 17 (*)    Acid-base deficit 9.0 (*)    HCT 37.0 (*)    Hemoglobin 12.6 (*)    All other components within normal limits  CBG MONITORING, ED - Abnormal; Notable  for the following components:   Glucose-Capillary 400 (*)    All other components within normal limits  CBG MONITORING, ED - Abnormal; Notable for the following components:   Glucose-Capillary 376 (*)    All other components within normal limits  CBG MONITORING, ED - Abnormal; Notable for the following components:   Glucose-Capillary 266 (*)    All other components within normal limits  CBG MONITORING, ED - Abnormal; Notable for the following components:   Glucose-Capillary 156 (*)    All other components within normal limits  TROPONIN I (HIGH SENSITIVITY) - Abnormal; Notable for the following components:   Troponin I (High Sensitivity) 74 (*)    All other components within normal limits  TROPONIN I (HIGH SENSITIVITY) - Abnormal; Notable for the following components:   Troponin I (High Sensitivity) 81 (*)    All other components within normal limits  MAGNESIUM   BETA-HYDROXYBUTYRIC ACID  HEMOGLOBIN A1C  BASIC METABOLIC PANEL WITH GFR  CBC  SODIUM, URINE, RANDOM  CREATININE, URINE, RANDOM                                                                                                                          Radiology CT Head Wo Contrast Result Date: 03/24/2024 CLINICAL DATA:  Seizure EXAM: CT HEAD WITHOUT CONTRAST TECHNIQUE: Contiguous axial images were obtained from the base of the skull through the vertex without intravenous contrast. RADIATION DOSE REDUCTION: This exam was performed according to the departmental dose-optimization program which includes automated exposure control, adjustment of the mA and/or kV according to patient size and/or use of iterative reconstruction technique. COMPARISON:  Head CT 03/10/2021 FINDINGS: Brain: No hemorrhage or intracranial mass. Atrophy and chronic small vessel ischemic changes of the white matter. Hypodensity within the right inferior frontal region, series 3 image 16 and 17 and coronal series 5, image 52. Nonenlarged ventricles Vascular: No  hyperdense vessels.  Carotid  vascular calcification Skull: Normal. Negative for fracture or focal lesion. Sinuses/Orbits: Mucosal thickening in the sinuses. Other: None IMPRESSION: 1. Negative for acute intracranial hemorrhage or mass. 2. Atrophy and chronic small vessel ischemic changes of the white matter. Hypodensity within the right inferior frontal region, suspect may be related to artifact, though subtle area of edema is difficult to exclude on this exam. Electronically Signed   By: Esmeralda Hedge M.D.   On: 03/24/2024 18:52   DG Chest Portable 1 View Result Date: 03/24/2024 CLINICAL DATA:  Seizure. EXAM: PORTABLE CHEST 1 VIEW COMPARISON:  08/08/2023. FINDINGS: Low lung volumes. Cardiomediastinal silhouette is otherwise within normal limits. Stable left chest wall pacemaker. No focal consolidation, sizeable pleural effusion, or pneumothorax. No acute osseous abnormality. IMPRESSION: No acute cardiopulmonary findings. Electronically Signed   By: Mannie Seek M.D.   On: 03/24/2024 18:01    Pertinent labs & imaging results that were available during my care of the patient were reviewed by me and considered in my medical decision making (see MDM for details).  Medications Ordered in ED Medications  aspirin  EC tablet 81 mg (has no administration in time range)  amLODipine  (NORVASC ) tablet 10 mg (has no administration in time range)  pantoprazole  (PROTONIX ) EC tablet 40 mg (has no administration in time range)  labetalol  (NORMODYNE ) injection 10 mg (has no administration in time range)  insulin  aspart (novoLOG ) injection 0-9 Units (has no administration in time range)  insulin  aspart (novoLOG ) injection 0-5 Units ( Subcutaneous Not Given 03/24/24 2315)  insulin  glargine-yfgn (SEMGLEE ) injection 10 Units (has no administration in time range)  insulin  aspart (novoLOG ) injection 3 Units (has no administration in time range)  heparin  injection 5,000 Units (has no administration in time range)   sodium chloride  flush (NS) 0.9 % injection 3 mL (3 mLs Intravenous Not Given 03/24/24 2241)  acetaminophen  (TYLENOL ) tablet 650 mg (has no administration in time range)    Or  acetaminophen  (TYLENOL ) suppository 650 mg (has no administration in time range)  sodium bicarbonate  150 mEq in sterile water  1,150 mL infusion (has no administration in time range)  sodium chloride  0.9 % bolus 1,000 mL (0 mLs Intravenous Stopped 03/24/24 1734)  insulin  aspart (novoLOG ) injection 10 Units (10 Units Subcutaneous Given 03/24/24 1848)  amLODipine  (NORVASC ) tablet 10 mg (10 mg Oral Given 03/24/24 2203)                                                                                                                                     Procedures .Critical Care  Performed by: Teddi Favors, DO Authorized by: Teddi Favors, DO   Critical care provider statement:    Critical care time (minutes):  30   Critical care time was exclusive of:  Separately billable procedures and treating other patients   Critical care was necessary to treat or prevent imminent or life-threatening deterioration of the following conditions:  Dehydration  Critical care was time spent personally by me on the following activities:  Development of treatment plan with patient or surrogate, discussions with consultants, evaluation of patient's response to treatment, examination of patient, ordering and review of laboratory studies, ordering and review of radiographic studies, ordering and performing treatments and interventions, pulse oximetry, re-evaluation of patient's condition, review of old charts and obtaining history from patient or surrogate   Care discussed with: admitting provider     (including critical care time)  Medical Decision Making / ED Course    Medical Decision Making:    Kipton Skillen is a 89 y.o. male with past medical history as below, significant for HLD, HTN, bladder cancer, impaired hearing who presents to the  ED with complaint of intermittent shaking. The complaint involves an extensive differential diagnosis and also carries with it a high risk of complications and morbidity.  Serious etiology was considered. Ddx includes but is not limited to: seizure, hyperglycemia, electrolyte derangement, arrhythmia, partial seizure, etc  Complete initial physical exam performed, notably the patient was in no distress, back to baseline.    Reviewed and confirmed nursing documentation for past medical history, family history, social history.  Vital signs reviewed.     Brief summary:  89 yo/m w/ hx as above here w/ intermittent shaking x3 episodes today, now back to normal I witnessed episode of the shaking while in the ER.  Appears to be a rhythmic spasming of his torso, left shoulder.  He was awake and alert during the episode did not have any pain around his pacemaker site.  He was attempting to urinate when the symptoms began   Clinical Course as of 03/25/24 0013  Fri Mar 24, 2024  1929 Troponin I (High Sensitivity)(!): 81 He denies chest pain [SG]  1929 Creatinine(!): 3.05 Aki, prior around 2; Echocardiogram 9/19 with LVEF 60-65%. Will give further fluids [SG]  1929 Ketones, ur: NEGATIVE DKA unlikely, HHS also seems unlikely  [SG]  2056 Pending callback from neurology  [SG]  2100 Spoke w/ dr Renaee Caro, does not feel is a seizure. Call back if continues  [SG]    Clinical Course User Index [SG] Teddi Favors, DO    Pacemaker was interrogated, wnl per ppm rep Per neuro if symptoms continue after aki/glucose+/htn improves would re-consult.  Glucose elev, improving He has AKI BP elev but improving, did not take meds today, does not appear to have hypertensive emergency Will admit TRH             Additional history obtained: -Additional history obtained from family -External records from outside source obtained and reviewed including: Chart review including previous notes, labs, imaging,  consultation notes including  Prior admission, home medications, prior labs and imaging   Lab Tests: -I ordered, reviewed, and interpreted labs.   The pertinent results include:   Labs Reviewed  CBC WITH DIFFERENTIAL/PLATELET - Abnormal; Notable for the following components:      Result Value   Hemoglobin 12.7 (*)    All other components within normal limits  COMPREHENSIVE METABOLIC PANEL WITH GFR - Abnormal; Notable for the following components:   CO2 16 (*)    Glucose, Bld 488 (*)    BUN 44 (*)    Creatinine, Ser 3.05 (*)    Total Protein 6.4 (*)    Albumin 3.4 (*)    GFR, Estimated 19 (*)    All other components within normal limits  LIPASE, BLOOD - Abnormal; Notable for the following components:  Lipase 65 (*)    All other components within normal limits  CK - Abnormal; Notable for the following components:   Total CK 37 (*)    All other components within normal limits  URINALYSIS, ROUTINE W REFLEX MICROSCOPIC - Abnormal; Notable for the following components:   Color, Urine STRAW (*)    Glucose, UA >=500 (*)    Protein, ur 100 (*)    All other components within normal limits  CBG MONITORING, ED - Abnormal; Notable for the following components:   Glucose-Capillary 457 (*)    All other components within normal limits  I-STAT VENOUS BLOOD GAS, ED - Abnormal; Notable for the following components:   pCO2, Ven 31.9 (*)    Bicarbonate 16.3 (*)    TCO2 17 (*)    Acid-base deficit 9.0 (*)    HCT 37.0 (*)    Hemoglobin 12.6 (*)    All other components within normal limits  CBG MONITORING, ED - Abnormal; Notable for the following components:   Glucose-Capillary 400 (*)    All other components within normal limits  CBG MONITORING, ED - Abnormal; Notable for the following components:   Glucose-Capillary 376 (*)    All other components within normal limits  CBG MONITORING, ED - Abnormal; Notable for the following components:   Glucose-Capillary 266 (*)    All other  components within normal limits  CBG MONITORING, ED - Abnormal; Notable for the following components:   Glucose-Capillary 156 (*)    All other components within normal limits  TROPONIN I (HIGH SENSITIVITY) - Abnormal; Notable for the following components:   Troponin I (High Sensitivity) 74 (*)    All other components within normal limits  TROPONIN I (HIGH SENSITIVITY) - Abnormal; Notable for the following components:   Troponin I (High Sensitivity) 81 (*)    All other components within normal limits  MAGNESIUM   BETA-HYDROXYBUTYRIC ACID  HEMOGLOBIN A1C  BASIC METABOLIC PANEL WITH GFR  CBC  SODIUM, URINE, RANDOM  CREATININE, URINE, RANDOM    Notable for as above  EKG   EKG Interpretation Date/Time:  Friday Mar 24 2024 15:09:27 EDT Ventricular Rate:  70 PR Interval:  154 QRS Duration:  155 QT Interval:  457 QTC Calculation: 494 R Axis:   -64  Text Interpretation: Sinus rhythm Atrial premature complex Left bundle branch block similar to prior Confirmed by Russella Courts ([id number]) on 03/24/2024 3:24:51 PM         Imaging Studies ordered: I ordered imaging studies including cth, cxr I independently visualized the following imaging with scope of interpretation limited to determining acute life threatening conditions related to emergency care; findings noted above I agree with the radiologist interpretation If any imaging was obtained with contrast I closely monitored patient for any possible adverse reaction a/w contrast administration in the emergency department   Medicines ordered and prescription drug management: Meds ordered this encounter  Medications   sodium chloride  0.9 % bolus 1,000 mL   insulin  aspart (novoLOG ) injection 10 Units   DISCONTD: lactated ringers  infusion   amLODipine  (NORVASC ) tablet 10 mg   aspirin  EC tablet 81 mg   amLODipine  (NORVASC ) tablet 10 mg   pantoprazole  (PROTONIX ) EC tablet 40 mg   labetalol  (NORMODYNE ) injection 10 mg   insulin  aspart  (novoLOG ) injection 0-9 Units    Correction coverage::   Sensitive (thin, NPO, renal)    CBG < 70::   Implement Hypoglycemia Standing Orders and refer to Hypoglycemia Standing Orders sidebar report  CBG 70 - 120::   0 units    CBG 121 - 150::   1 unit    CBG 151 - 200::   2 units    CBG 201 - 250::   3 units    CBG 251 - 300::   5 units    CBG 301 - 350::   7 units    CBG 351 - 400:   9 units    CBG > 400:   call MD and obtain STAT lab verification   insulin  aspart (novoLOG ) injection 0-5 Units    Correction coverage::   HS scale    CBG < 70::   Implement Hypoglycemia Standing Orders and refer to Hypoglycemia Standing Orders sidebar report    CBG 70 - 120::   0 units    CBG 121 - 150::   0 units    CBG 151 - 200::   0 units    CBG 201 - 250::   2 units    CBG 251 - 300::   3 units    CBG 301 - 350::   4 units    CBG 351 - 400::   5 units    CBG > 400:   call MD and obtain STAT lab verification   insulin  glargine-yfgn (SEMGLEE ) injection 10 Units   insulin  aspart (novoLOG ) injection 3 Units   heparin  injection 5,000 Units   sodium chloride  flush (NS) 0.9 % injection 3 mL   OR Linked Order Group    acetaminophen  (TYLENOL ) tablet 650 mg    acetaminophen  (TYLENOL ) suppository 650 mg   sodium bicarbonate  150 mEq in sterile water  1,150 mL infusion    Sodium Bicarbonate  150 mEq added to 1L SWFI = 261 mOsm/L.    -I have reviewed the patients home medicines and have made adjustments as needed   Consultations Obtained I requested consultation with the neurology, trh,  and discussed lab and imaging findings as well as pertinent plan    Cardiac Monitoring: The patient was maintained on a cardiac monitor.  I personally viewed and interpreted the cardiac monitored which showed an underlying rhythm of: nsr Continuous pulse oximetry interpreted by myself, 99% on RA.    Social Determinants of Health:  Diagnosis or treatment significantly limited by social determinants of health: former  smoker   Reevaluation: After the interventions noted above, I reevaluated the patient and found that they have improved  Co morbidities that complicate the patient evaluation  Past Medical History:  Diagnosis Date   Anemia    Arthritis    OCC LEG PAIN   Bladder cancer (HCC) TCC   History of bladder carcinoma; S/P BCG TX'S ; "never treated for cancer; it was a spot on my bladder" (07/20/2018)   CKD (chronic kidney disease), stage III (HCC)    History of gout    "on daily RX" (07/20/2018)   History of kidney stones    Hyperlipidemia    Hypertension    Impaired hearing BILATERAL    OCCASIONAL WEARS HEARING AIDS   Mobitz I    Maximo Spar 07/20/2018   Pneumonia  ~ 1980s X 1   Presence of permanent cardiac pacemaker 07/20/2018   Right bundle branch block    Spondylolisthesis    Type II diabetes mellitus (HCC)       Dispostion: Disposition decision including need for hospitalization was considered, and patient admitted to the hospital.    Final Clinical Impression(s) / ED Diagnoses Final diagnoses:  Spasm  AKI (acute  kidney injury) (HCC)  Hyperglycemia        Teddi Favors, DO 03/25/24 0013

## 2024-03-25 ENCOUNTER — Observation Stay (HOSPITAL_COMMUNITY)

## 2024-03-25 DIAGNOSIS — R251 Tremor, unspecified: Secondary | ICD-10-CM | POA: Diagnosis not present

## 2024-03-25 DIAGNOSIS — Z8551 Personal history of malignant neoplasm of bladder: Secondary | ICD-10-CM | POA: Diagnosis not present

## 2024-03-25 DIAGNOSIS — E1165 Type 2 diabetes mellitus with hyperglycemia: Secondary | ICD-10-CM | POA: Diagnosis present

## 2024-03-25 DIAGNOSIS — I160 Hypertensive urgency: Secondary | ICD-10-CM | POA: Diagnosis present

## 2024-03-25 DIAGNOSIS — Z91119 Patient's noncompliance with dietary regimen due to unspecified reason: Secondary | ICD-10-CM | POA: Diagnosis not present

## 2024-03-25 DIAGNOSIS — Z7982 Long term (current) use of aspirin: Secondary | ICD-10-CM | POA: Diagnosis not present

## 2024-03-25 DIAGNOSIS — R259 Unspecified abnormal involuntary movements: Secondary | ICD-10-CM | POA: Diagnosis present

## 2024-03-25 DIAGNOSIS — Z794 Long term (current) use of insulin: Secondary | ICD-10-CM | POA: Diagnosis not present

## 2024-03-25 DIAGNOSIS — E785 Hyperlipidemia, unspecified: Secondary | ICD-10-CM | POA: Diagnosis present

## 2024-03-25 DIAGNOSIS — N1832 Chronic kidney disease, stage 3b: Secondary | ICD-10-CM | POA: Diagnosis present

## 2024-03-25 DIAGNOSIS — E860 Dehydration: Secondary | ICD-10-CM | POA: Diagnosis present

## 2024-03-25 DIAGNOSIS — E872 Acidosis, unspecified: Secondary | ICD-10-CM | POA: Diagnosis present

## 2024-03-25 DIAGNOSIS — I459 Conduction disorder, unspecified: Secondary | ICD-10-CM | POA: Diagnosis present

## 2024-03-25 DIAGNOSIS — R569 Unspecified convulsions: Secondary | ICD-10-CM

## 2024-03-25 DIAGNOSIS — E1122 Type 2 diabetes mellitus with diabetic chronic kidney disease: Secondary | ICD-10-CM | POA: Diagnosis present

## 2024-03-25 DIAGNOSIS — I129 Hypertensive chronic kidney disease with stage 1 through stage 4 chronic kidney disease, or unspecified chronic kidney disease: Secondary | ICD-10-CM | POA: Diagnosis present

## 2024-03-25 DIAGNOSIS — Z87891 Personal history of nicotine dependence: Secondary | ICD-10-CM | POA: Diagnosis not present

## 2024-03-25 DIAGNOSIS — N179 Acute kidney failure, unspecified: Secondary | ICD-10-CM | POA: Diagnosis present

## 2024-03-25 DIAGNOSIS — Z91148 Patient's other noncompliance with medication regimen for other reason: Secondary | ICD-10-CM | POA: Diagnosis not present

## 2024-03-25 DIAGNOSIS — Z950 Presence of cardiac pacemaker: Secondary | ICD-10-CM | POA: Diagnosis not present

## 2024-03-25 LAB — BASIC METABOLIC PANEL WITH GFR
Anion gap: 10 (ref 5–15)
BUN: 37 mg/dL — ABNORMAL HIGH (ref 8–23)
CO2: 20 mmol/L — ABNORMAL LOW (ref 22–32)
Calcium: 9.8 mg/dL (ref 8.9–10.3)
Chloride: 111 mmol/L (ref 98–111)
Creatinine, Ser: 2.38 mg/dL — ABNORMAL HIGH (ref 0.61–1.24)
GFR, Estimated: 26 mL/min — ABNORMAL LOW (ref >60)
Glucose, Bld: 83 mg/dL (ref 70–99)
Potassium: 4.2 mmol/L (ref 3.5–5.1)
Sodium: 141 mmol/L (ref 135–145)

## 2024-03-25 LAB — CBC
HCT: 35.9 % — ABNORMAL LOW (ref 39.0–52.0)
Hemoglobin: 11.6 g/dL — ABNORMAL LOW (ref 13.0–17.0)
MCH: 27.3 pg (ref 26.0–34.0)
MCHC: 32.3 g/dL (ref 30.0–36.0)
MCV: 84.5 fL (ref 80.0–100.0)
Platelets: 185 10*3/uL (ref 150–400)
RBC: 4.25 MIL/uL (ref 4.22–5.81)
RDW: 15.3 % (ref 11.5–15.5)
WBC: 8.8 10*3/uL (ref 4.0–10.5)
nRBC: 0 % (ref 0.0–0.2)

## 2024-03-25 LAB — HEMOGLOBIN A1C
Hgb A1c MFr Bld: 11.3 % — ABNORMAL HIGH (ref 4.8–5.6)
Mean Plasma Glucose: 277.61 mg/dL

## 2024-03-25 LAB — PHOSPHORUS: Phosphorus: 3.4 mg/dL (ref 2.5–4.6)

## 2024-03-25 LAB — GLUCOSE, CAPILLARY
Glucose-Capillary: 131 mg/dL — ABNORMAL HIGH (ref 70–99)
Glucose-Capillary: 243 mg/dL — ABNORMAL HIGH (ref 70–99)
Glucose-Capillary: 183 mg/dL — ABNORMAL HIGH (ref 70–99)

## 2024-03-25 LAB — CREATININE, URINE, RANDOM: Creatinine, Urine: 47 mg/dL

## 2024-03-25 LAB — MAGNESIUM: Magnesium: 2.1 mg/dL (ref 1.7–2.4)

## 2024-03-25 LAB — SODIUM, URINE, RANDOM: Sodium, Ur: 139 mmol/L

## 2024-03-25 MED ORDER — STERILE WATER FOR INJECTION IV SOLN
INTRAVENOUS | Status: DC
  Filled 2024-03-25 (×2): qty 1000

## 2024-03-25 MED ORDER — CARVEDILOL 3.125 MG PO TABS
3.1250 mg | ORAL_TABLET | Freq: Two times a day (BID) | ORAL | Status: DC
  Administered 2024-03-25 – 2024-03-28 (×6): 3.125 mg via ORAL
  Filled 2024-03-25 (×6): qty 1

## 2024-03-25 NOTE — Plan of Care (Signed)
  Problem: Education: Goal: Ability to describe self-care measures that may prevent or decrease complications (Diabetes Survival Skills Education) will improve Outcome: Progressing Goal: Individualized Educational Video(s) Outcome: Progressing   Problem: Coping: Goal: Ability to adjust to condition or change in health will improve Outcome: Progressing   Problem: Fluid Volume: Goal: Ability to maintain a balanced intake and output will improve Outcome: Progressing   Problem: Health Behavior/Discharge Planning: Goal: Ability to identify and utilize available resources and services will improve Outcome: Progressing Goal: Ability to manage health-related needs will improve Outcome: Progressing   Problem: Nutritional: Goal: Maintenance of adequate nutrition will improve Outcome: Progressing Goal: Progress toward achieving an optimal weight will improve Outcome: Progressing   Problem: Skin Integrity: Goal: Risk for impaired skin integrity will decrease Outcome: Progressing   Problem: Education: Goal: Knowledge of General Education information will improve Description: Including pain rating scale, medication(s)/side effects and non-pharmacologic comfort measures Outcome: Progressing   Problem: Clinical Measurements: Goal: Ability to maintain clinical measurements within normal limits will improve Outcome: Progressing Goal: Will remain free from infection Outcome: Progressing Goal: Diagnostic test results will improve Outcome: Progressing Goal: Respiratory complications will improve Outcome: Progressing Goal: Cardiovascular complication will be avoided Outcome: Progressing

## 2024-03-25 NOTE — ED Notes (Signed)
 Report given patient moved to 35

## 2024-03-25 NOTE — Procedures (Signed)
 Patient Name: Keith Drake  MRN: [id number]  Epilepsy Attending: Arleene Lack  Referring Physician/Provider: Epifanio Haste, MD  Date: 03/25/2024 Duration: 29.02 mins  Patient history: 89yo M with seizure like episode. EEG to evaluate for seizure  Level of alertness: Awake  AEDs during EEG study: None  Technical aspects: This EEG study was done with scalp electrodes positioned according to the 10-20 International system of electrode placement. Electrical activity was reviewed with band pass filter of 1-70Hz , sensitivity of 7 uV/mm, display speed of 32mm/sec with a 60Hz  notched filter applied as appropriate. EEG data were recorded continuously and digitally stored.  Video monitoring was available and reviewed as appropriate.  Description: The posterior dominant rhythm consists of 8 Hz activity of moderate voltage (25-35 uV) seen predominantly in posterior head regions, symmetric and reactive to eye opening and eye closing. Hyperventilation and photic stimulation were not performed.     IMPRESSION: This study is within normal limits. No seizures or epileptiform discharges were seen throughout the recording.  A normal interictal EEG does not exclude the diagnosis of epilepsy.   Raelynn Corron O Glinda Natzke

## 2024-03-25 NOTE — Progress Notes (Signed)
 PROGRESS NOTE    Asa Baudoin  WGN:[id number] DOB: 1935-10-16 DOA: 03/24/2024 PCP: Aldo Hun, MD    Chief Complaint  Patient presents with   Shaking    Brief Narrative:   Stevens Magwood is a 89 y.o. male with medical history significant for hypertension, insulin -dependent diabetes mellitus, CKD stage 3B, gout, bladder cancer, and heart block with pacemaker who presents for evaluation of shaking episodes.   Patient reports that he has been urinating more frequently the past couple days but had otherwise been in his usual state until this morning at approximately 11 AM when he had an episode of shaking that mainly involves his left arm and left torso.  This resolved spontaneously within approximately 1 minute.  The patient maintained awareness throughout the episode.  He went on to have recurrent episodes which prompted his family to call EMS.   Patient denies any pain, fever, chills, or shortness of breath.  He states that he typically has bilateral lower extremity swelling but not currently.   ED Course: Upon arrival to the ED, patient is found to be afebrile and saturating well on room air with severely elevated blood pressure.  EKG demonstrates sinus rhythm with PAC and chronic LBBB.  There are no acute findings on chest x-ray or head CT.  Labs are most notable for glucose 488, creatinine 3.05, bicarbonate 16, normal WBC, troponin 74, and no urine ketones.    ED physician discussed the case with neurology who advised treating the AKI, hyperglycemia, and hypertension, and formally consulting if his shaking episodes persist despite this.   Patient was given a liter of NS and 10 units subcutaneous NovoLog  in the ED.   Assessment & Plan:   Principal Problem:   Episode of shaking Active Problems:   Acute renal failure superimposed on stage 3b chronic kidney disease (HCC)   Type 2 diabetes mellitus (HCC)   Hypertensive urgency   Metabolic acidosis  Shaking episode/seizure-like  activity -Not clear of these events related to seizures, or events related to significantly elevated hyperglycemia, hypertension, AKI and acidosis. - Will obtain EEG. - CT head with questionable finding in the right frontal anterior region, will proceed with MRI of the brain without contrast for further evaluation, if needed can do with contrast once renal function normalized. -PT, OT consult    AKI superimposed on CKD 3B; metabolic acidosis  -Appears to be clinically dehydrated, plan creatinine around 2, it is 3.05 on admission, continue with IV fluids, hold nephrotoxic medication, improving, creatinine down to 0.38 today, meanwhile continue with IV fluids, bicarb drip especially he remains with significant acidosis with a bicarb of 20.  Hypertensive urgency  - Continue Norvasc , use IV labetalol  as needed   -Pressure remains significantly elevated this morning despite being on his home Norvasc , will add low-dose Coreg   Diabetes mellitus, type II, severely uncontrolled with hyperglycemia -Glucose significantly elevated 488 on presentation. - A1c is 11.3. - Severely uncontrolled, continue with home insulin  -Does not appear to be in DKA      DVT prophylaxis: Heparin  Code Status: full Family Communication: none at bedside Disposition:   Status is: Patient is currently observation, but he will need to stay 2 midnight as remains clinically dehydrated, acidosis, on bicarb and IV fluids, and need of further workup for his acute findings.    Consultants:  none   Subjective:  Reports he is feeling better, reports generalized weakness still present  Objective: Vitals:   03/25/24 0500 03/25/24 0800 03/25/24 0900 03/25/24 1100  BP: 126/63 (!) 183/89 (!) 149/89 (!) 153/81  Pulse: 65 73  65  Resp: 17 (!) 21  18  Temp:  (!) 97.3 F (36.3 C)  98 F (36.7 C)  TempSrc:  Oral  Oral  SpO2: 100% 99%  97%  Weight:      Height:        Intake/Output Summary (Last 24 hours) at 03/25/2024  1234 Last data filed at 03/24/2024 1734 Gross per 24 hour  Intake 1000 ml  Output --  Net 1000 ml   Filed Weights   03/24/24 1507 03/25/24 0339  Weight: 83.9 kg 81.1 kg    Examination:  Awake Alert, Oriented X 3, frail Symmetrical Chest wall movement, Good air movement bilaterally, CTAB RRR,No Gallops,Rubs or new Murmurs, No Parasternal Heave +ve B.Sounds, Abd Soft, No tenderness, No rebound - guarding or rigidity. No Cyanosis, Clubbing or edema, slow skin turgor, dry tongue.     Data Reviewed: I have personally reviewed following labs and imaging studies  CBC: Recent Labs  Lab 03/24/24 1548 03/24/24 1612 03/25/24 0747  WBC 10.0  --  8.8  NEUTROABS 5.0  --   --   HGB 12.7* 12.6* 11.6*  HCT 39.1 37.0* 35.9*  MCV 86.1  --  84.5  PLT 231  --  185    Basic Metabolic Panel: Recent Labs  Lab 03/24/24 1548 03/24/24 1612 03/25/24 0747  NA 137 138 141  K 5.1 5.0 4.2  CL 107  --  111  CO2 16*  --  20*  GLUCOSE 488*  --  83  BUN 44*  --  37*  CREATININE 3.05*  --  2.38*  CALCIUM  10.0  --  9.8  MG 2.3  --   --     GFR: Estimated Creatinine Clearance: 22.6 mL/min (A) (by C-G formula based on SCr of 2.38 mg/dL (H)).  Liver Function Tests: Recent Labs  Lab 03/24/24 1548  AST 17  ALT 13  ALKPHOS 88  BILITOT 0.6  PROT 6.4*  ALBUMIN 3.4*    CBG: Recent Labs  Lab 03/24/24 1511 03/24/24 1718 03/24/24 1847 03/24/24 2044 03/24/24 2313  GLUCAP 457* 400* 376* 266* 156*     No results found for this or any previous visit (from the past 240 hours).       Radiology Studies: US  RENAL Result Date: 03/25/2024 EXAM: RETROPERITONEAL ULTRASOUND OF THE KIDNEYS AND URINARY BLADDER TECHNIQUE: Real-time ultrasonography of the retroperitoneum including the kidneys and urinary bladder was performed. COMPARISON: CT abdomen / pelvis dated 08/08/2023 CLINICAL HISTORY: Acute renal failure superimposed on stage 3b chronic kidney disease (HCC). FINDINGS: RIGHT KIDNEY: The  right kidney measures 9.7 x 5.2 x 4.9 cm (calculated volume 129 ml). The right kidney demonstrates normal cortical echogenicity. No hydronephrosis or intrarenal stones. LEFT KIDNEY: The left kidney measures 9.5 x 5.5 x 5.3 cm (calculated volume 144 ml). The left kidney demonstrates normal cortical echogenicity. No hydronephrosis or intrarenal stones. BLADDER: Unremarkable appearance of the bladder. IMPRESSION: 1. No acute findings. Electronically signed by: Zadie Herter MD 03/25/2024 12:31 AM EDT RP Workstation: [id number]   CT Head Wo Contrast Result Date: 03/24/2024 CLINICAL DATA:  Seizure EXAM: CT HEAD WITHOUT CONTRAST TECHNIQUE: Contiguous axial images were obtained from the base of the skull through the vertex without intravenous contrast. RADIATION DOSE REDUCTION: This exam was performed according to the departmental dose-optimization program which includes automated exposure control, adjustment of the mA and/or kV according to patient size and/or use of iterative reconstruction technique.  COMPARISON:  Head CT 03/10/2021 FINDINGS: Brain: No hemorrhage or intracranial mass. Atrophy and chronic small vessel ischemic changes of the white matter. Hypodensity within the right inferior frontal region, series 3 image 16 and 17 and coronal series 5, image 52. Nonenlarged ventricles Vascular: No hyperdense vessels.  Carotid vascular calcification Skull: Normal. Negative for fracture or focal lesion. Sinuses/Orbits: Mucosal thickening in the sinuses. Other: None IMPRESSION: 1. Negative for acute intracranial hemorrhage or mass. 2. Atrophy and chronic small vessel ischemic changes of the white matter. Hypodensity within the right inferior frontal region, suspect may be related to artifact, though subtle area of edema is difficult to exclude on this exam. Electronically Signed   By: Esmeralda Hedge M.D.   On: 03/24/2024 18:52   DG Chest Portable 1 View Result Date: 03/24/2024 CLINICAL DATA:  Seizure. EXAM:  PORTABLE CHEST 1 VIEW COMPARISON:  08/08/2023. FINDINGS: Low lung volumes. Cardiomediastinal silhouette is otherwise within normal limits. Stable left chest wall pacemaker. No focal consolidation, sizeable pleural effusion, or pneumothorax. No acute osseous abnormality. IMPRESSION: No acute cardiopulmonary findings. Electronically Signed   By: Mannie Seek M.D.   On: 03/24/2024 18:01        Scheduled Meds:  amLODipine   10 mg Oral Daily   aspirin  EC  81 mg Oral Daily   heparin   5,000 Units Subcutaneous Q8H   insulin  aspart  0-5 Units Subcutaneous QHS   insulin  aspart  0-9 Units Subcutaneous TID WC   insulin  aspart  3 Units Subcutaneous TID WC   insulin  glargine-yfgn  10 Units Subcutaneous QHS   pantoprazole   40 mg Oral Daily   sodium chloride  flush  3 mL Intravenous Q12H   Continuous Infusions:  sodium bicarbonate  150 mEq in sterile water  1,150 mL infusion       LOS: 0 days       Seena Dadds, MD [facility]   To contact the attending provider between 7A-7P or the covering provider during after hours 7P-7A, please log into the web site [email or web address] and access using universal [place] password for that web site. If you do not have the password, please call the hospital operator.  03/25/2024, 12:34 PM

## 2024-03-25 NOTE — Plan of Care (Signed)
  Problem: Coping: Goal: Ability to adjust to condition or change in health will improve Outcome: Progressing   Problem: Metabolic: Goal: Ability to maintain appropriate glucose levels will improve Outcome: Progressing   Problem: Nutritional: Goal: Maintenance of adequate nutrition will improve Outcome: Progressing Goal: Progress toward achieving an optimal weight will improve Outcome: Progressing   Problem: Skin Integrity: Goal: Risk for impaired skin integrity will decrease Outcome: Progressing   Problem: Education: Goal: Knowledge of General Education information will improve Description: Including pain rating scale, medication(s)/side effects and non-pharmacologic comfort measures Outcome: Progressing   Problem: Clinical Measurements: Goal: Will remain free from infection Outcome: Progressing   Problem: Activity: Goal: Risk for activity intolerance will decrease Outcome: Progressing   Problem: Nutrition: Goal: Adequate nutrition will be maintained Outcome: Progressing   Problem: Coping: Goal: Level of anxiety will decrease Outcome: Progressing

## 2024-03-25 NOTE — Evaluation (Signed)
 Physical Therapy Evaluation Patient Details Name: Keith Drake MRN: [id number] DOB: 07/12/1935 Today's Date: 03/25/2024  History of Present Illness  89 y.o. male who presents for evaluation of shaking episodes. PMH: hypertension, insulin -dependent diabetes mellitus, CKD stage 3B, gout, bladder cancer, and heart block with pacemaker.  Clinical Impression  Pt admitted with above diagnosis. Limited evaluation - did not progress with gait training/assessment with arrival of lunch tray and EEG tech to apply leads.  He required min assist for transfer out of bed with single UE support and step pivot transfer for balance. Very friendly and motivated to return home but open to rehab if needed. States sons can provide 24/7 assist if necessary at home (Unconfirmed.) Anticipate HHPT follow-up appropriate but have asked OT to touch base with us  after their assessment. Would like to insure pt able to ambulate safely before final recommendation placed. Pt currently with functional limitations due to the deficits listed below (see PT Problem List). Pt will benefit from acute skilled PT to increase their independence and safety with mobility to allow discharge.           If plan is discharge home, recommend the following: A little help with walking and/or transfers;A little help with bathing/dressing/bathroom;Assistance with cooking/housework;Assist for transportation;Help with stairs or ramp for entrance   Can travel by private vehicle        Equipment Recommendations None recommended by PT  Recommendations for Other Services       Functional Status Assessment Patient has had a recent decline in their functional status and demonstrates the ability to make significant improvements in function in a reasonable and predictable amount of time.     Precautions / Restrictions Precautions Precautions: Fall Recall of Precautions/Restrictions: Intact Restrictions Weight Bearing Restrictions Per Provider Order:  No      Mobility  Bed Mobility Overal bed mobility: Needs Assistance Bed Mobility: Supine to Sit     Supine to sit: Contact guard, Used rails, HOB elevated     General bed mobility comments: CGA for safety, HOB elevated, used rail. Slow to rise but stable once EOB.    Transfers Overall transfer level: Needs assistance Equipment used: 1 person hand held assist Transfers: Sit to/from Stand, Bed to chair/wheelchair/BSC Sit to Stand: Min assist   Step pivot transfers: Min assist       General transfer comment: Min assist to steady with sit to stand transition, step pivot transfer towards Rt side, reaching for arm rest of chair. Cues for safety and technique, trunk relatively flexted.    Ambulation/Gait               General Gait Details: Deferred - arrival of lunch and EEG.  Stairs            Wheelchair Mobility     Tilt Bed    Modified Rankin (Stroke Patients Only)       Balance Overall balance assessment: Needs assistance Sitting-balance support: No upper extremity supported, Feet supported Sitting balance-[name]: Good     Standing balance support: Single extremity supported, During functional activity Standing balance-[name]: Poor                               Pertinent Vitals/Pain Pain Assessment Pain Assessment: No/denies pain    Home Living Family/patient expects to be discharged to:: Private residence Living Arrangements: Alone Available Help at Discharge: Family;Available 24 hours/day (States sons could provide 24/7 assist if necessary -  unavailable to confirm during eval.) Type of Home: Mobile home Home Access: Stairs to enter Entrance Stairs-Rails: Lawyer of Steps: 2   Home Layout: One level Home Equipment: Rollator (4 wheels);Cane - single point;Shower seat;Cane - quad;Grab bars - tub/shower;Rolling Environmental consultant (2 wheels) Additional Comments: States two sons live nextdoor.    Prior  Function Prior Level of Function : Independent/Modified Independent             Mobility Comments: pt reports mod I at [facility] level for household mobility ADLs Comments: mod I for ADLs, self care tasks     Extremity/Trunk Assessment   Upper Extremity Assessment Upper Extremity Assessment: Defer to OT evaluation    Lower Extremity Assessment Lower Extremity Assessment: Generalized weakness       Communication   Communication Communication: Impaired Factors Affecting Communication: Hearing impaired    Cognition Arousal: Alert Behavior During Therapy: WFL for tasks assessed/performed   PT - Cognitive impairments: No family/caregiver present to determine baseline                         Following commands: Intact       Cueing Cueing Techniques: Verbal cues     General Comments General comments (skin integrity, edema, etc.): VSS throughout on RA.    Exercises     Assessment/Plan    PT Assessment Patient needs continued PT services  PT Problem List Decreased strength;Decreased activity tolerance;Decreased balance;Decreased mobility;Decreased coordination;Decreased knowledge of use of DME       PT Treatment Interventions DME instruction;Gait training;Stair training;Functional mobility training;Therapeutic activities;Therapeutic exercise;Balance training;Neuromuscular re-education;Patient/family education    PT Goals (Current goals can be found in the Care Plan section)  Acute Rehab PT Goals Patient Stated Goal: Go home PT Goal Formulation: With patient Time For Goal Achievement: 04/07/24 Potential to Achieve Goals: Good    Frequency Min 2X/week     Co-evaluation               AM-PAC PT "6 Clicks" Mobility  Outcome Measure Help needed turning from your back to your side while in a flat bed without using bedrails?: None Help needed moving from lying on your back to sitting on the side of a flat bed without using bedrails?: A Little Help  needed moving to and from a bed to a chair (including a wheelchair)?: A Little Help needed standing up from a chair using your arms (e.g., wheelchair or bedside chair)?: A Little Help needed to walk in hospital room?: A Little Help needed climbing 3-5 steps with a railing? : A Lot 6 Click Score: 18    End of Session Equipment Utilized During Treatment: Gait belt Activity Tolerance: Patient tolerated treatment well Patient left: in chair;with call bell/phone within reach;with chair alarm set (EEG tech in room) Nurse Communication: Mobility status PT Visit Diagnosis: Unsteadiness on feet (R26.81);Muscle weakness (generalized) (M62.81);Difficulty in walking, not elsewhere classified (R26.2);Other symptoms and signs involving the nervous system (R29.898)    Time: 4098-1191 PT Time Calculation (min) (ACUTE ONLY): 22 min   Charges:   PT Evaluation $PT Eval Low Complexity: 1 Low   PT General Charges $$ ACUTE PT VISIT: 1 Visit         Jory Ng, PT, DPT [facility]  Rehabilitation Services Physical Therapist Office: [phone] Website: [email or web address]   Alinda Irani 03/25/2024, 2:17 PM

## 2024-03-25 NOTE — Progress Notes (Signed)
 Routine EEG completed, results pending Neurology review and interpretation

## 2024-03-25 NOTE — Plan of Care (Signed)
  Problem: Education: Goal: Ability to describe self-care measures that may prevent or decrease complications (Diabetes Survival Skills Education) will improve Outcome: Progressing   Problem: Coping: Goal: Ability to adjust to condition or change in health will improve Outcome: Progressing   Problem: Health Behavior/Discharge Planning: Goal: Ability to manage health-related needs will improve Outcome: Progressing   Problem: Metabolic: Goal: Ability to maintain appropriate glucose levels will improve Outcome: Progressing   Problem: Education: Goal: Knowledge of General Education information will improve Description: Including pain rating scale, medication(s)/side effects and non-pharmacologic comfort measures Outcome: Progressing   Problem: Clinical Measurements: Goal: Will remain free from infection Outcome: Progressing Goal: Diagnostic test results will improve Outcome: Progressing   Problem: Coping: Goal: Level of anxiety will decrease Outcome: Progressing

## 2024-03-26 DIAGNOSIS — R251 Tremor, unspecified: Secondary | ICD-10-CM | POA: Diagnosis not present

## 2024-03-26 LAB — BASIC METABOLIC PANEL WITH GFR
Anion gap: 9 (ref 5–15)
BUN: 35 mg/dL — ABNORMAL HIGH (ref 8–23)
CO2: 19 mmol/L — ABNORMAL LOW (ref 22–32)
Calcium: 9.4 mg/dL (ref 8.9–10.3)
Chloride: 108 mmol/L (ref 98–111)
Creatinine, Ser: 2.33 mg/dL — ABNORMAL HIGH (ref 0.61–1.24)
GFR, Estimated: 26 mL/min — ABNORMAL LOW (ref >60)
Glucose, Bld: 152 mg/dL — ABNORMAL HIGH (ref 70–99)
Potassium: 4.4 mmol/L (ref 3.5–5.1)
Sodium: 136 mmol/L (ref 135–145)

## 2024-03-26 LAB — CBC
HCT: 33.5 % — ABNORMAL LOW (ref 39.0–52.0)
Hemoglobin: 10.6 g/dL — ABNORMAL LOW (ref 13.0–17.0)
MCH: 27 pg (ref 26.0–34.0)
MCHC: 31.6 g/dL (ref 30.0–36.0)
MCV: 85.5 fL (ref 80.0–100.0)
Platelets: 174 10*3/uL (ref 150–400)
RBC: 3.92 MIL/uL — ABNORMAL LOW (ref 4.22–5.81)
RDW: 15.4 % (ref 11.5–15.5)
WBC: 8.6 10*3/uL (ref 4.0–10.5)
nRBC: 0 % (ref 0.0–0.2)

## 2024-03-26 LAB — GLUCOSE, CAPILLARY
Glucose-Capillary: 110 mg/dL — ABNORMAL HIGH (ref 70–99)
Glucose-Capillary: 137 mg/dL — ABNORMAL HIGH (ref 70–99)
Glucose-Capillary: 139 mg/dL — ABNORMAL HIGH (ref 70–99)
Glucose-Capillary: 155 mg/dL — ABNORMAL HIGH (ref 70–99)
Glucose-Capillary: 184 mg/dL — ABNORMAL HIGH (ref 70–99)

## 2024-03-26 MED ORDER — SODIUM BICARBONATE 650 MG PO TABS
650.0000 mg | ORAL_TABLET | Freq: Two times a day (BID) | ORAL | Status: DC
  Administered 2024-03-26 – 2024-03-28 (×5): 650 mg via ORAL
  Filled 2024-03-26 (×5): qty 1

## 2024-03-26 NOTE — Evaluation (Signed)
 Occupational Therapy Evaluation Patient Details Name: Keith Drake MRN: [id number] DOB: Jul 11, 1935 Today's Date: 03/26/2024   History of Present Illness   89 y.o. male who presents for evaluation of shaking episodes. PMH: hypertension, insulin -dependent diabetes mellitus, CKD stage 3B, gout, bladder cancer, and heart block with pacemaker.     Clinical Impressions Pt admitted based on above, and was seen based on problem list below. PTA pt was living alone and was independent with ADLs and IADLs. Today pt is requiring set up  to CGA for ADLs. Bed mobility and functional transfers are  CGA. No family present to determine cog baseline, however today pt presenting as slightly impulsive, with poor safety awareness and insight into deficits. Pt mobilizing well but reporting unreliable family support available upon d/c. Educated pt on benefits of post-acute rehab to improve balance, strength, and safety within the home. Recommendation of <3 hours of skilled rehab daily. OT will continue to follow acutely to maximize functional independence.        If plan is discharge home, recommend the following:   A little help with walking and/or transfers;A little help with bathing/dressing/bathroom     Functional Status Assessment   Patient has had a recent decline in their functional status and demonstrates the ability to make significant improvements in function in a reasonable and predictable amount of time.     Equipment Recommendations   Other (comment) (Defer to next venue)     Recommendations for Other Services         Precautions/Restrictions   Precautions Precautions: Fall Recall of Precautions/Restrictions: Impaired Restrictions Weight Bearing Restrictions Per Provider Order: No     Mobility Bed Mobility Overal bed mobility: Needs Assistance Bed Mobility: Supine to Sit     Supine to sit: Contact guard, Used rails, HOB elevated     General bed mobility comments:  Cues for safety    Transfers Overall transfer level: Needs assistance Equipment used: Rolling walker (2 wheels) Transfers: Sit to/from Stand, Bed to chair/wheelchair/BSC Sit to Stand: Contact guard assist     Step pivot transfers: Contact guard assist     General transfer comment: CGA cues for sequencing and problem solving with RW      Balance Overall balance assessment: Needs assistance Sitting-balance support: No upper extremity supported, Feet supported Sitting balance-[name]: Good     Standing balance support: Bilateral upper extremity supported Standing balance-[name]: Fair Standing balance comment: Reliant on RW         ADL either performed or assessed with clinical judgement   ADL Overall ADL's : Needs assistance/impaired Eating/Feeding: Set up;Sitting   Grooming: Set up;Sitting   Upper Body Bathing: Set up;Sitting   Lower Body Bathing: Contact guard assist;Sit to/from stand   Upper Body Dressing : Set up;Sitting   Lower Body Dressing: Contact guard assist;Sit to/from stand Lower Body Dressing Details (indicate cue type and reason): CGA for balance, can figure 4 for socks Toilet Transfer: Contact guard assist;Ambulation;Rolling walker (2 wheels) Toilet Transfer Details (indicate cue type and reason): Simulated in room cues for safety with RW Toileting- Clothing Manipulation and Hygiene: Contact guard assist;Sit to/from stand Toileting - Clothing Manipulation Details (indicate cue type and reason): CGA for balance, pt benefits from single UE support     Functional mobility during ADLs: Contact guard assist;Rolling walker (2 wheels) General ADL Comments: Pt likely near functional baseline, benefits from use of RW for decreased balance. Poor safety awareness     Vision Baseline Vision/History: 0 No visual deficits  Vision Assessment?: No apparent visual deficits            Pertinent Vitals/Pain Pain Assessment Pain Assessment: No/denies pain      Extremity/Trunk Assessment Upper Extremity Assessment Upper Extremity Assessment: Overall WFL for tasks assessed   Lower Extremity Assessment Lower Extremity Assessment: Defer to PT evaluation       Communication Communication Communication: Impaired Factors Affecting Communication: Hearing impaired   Cognition Arousal: Alert Behavior During Therapy: Impulsive (Slightly) Cognition: No family/caregiver present to determine baseline       OT - Cognition Comments: Pt showing poor safety awareness and judgement, no family present to verify PLOF and information. Pt tangiental and only following one step commands       Following commands: Impaired Following commands impaired: Follows one step commands with increased time     Cueing  General Comments   Cueing Techniques: Verbal cues  VSS on RA, pt laying in smear,and soiled, however pt reports bowel and bladder continence           Home Living Family/patient expects to be discharged to:: Private residence Living Arrangements: Alone Available Help at Discharge: Family;Available PRN/intermittently Type of Home: Mobile home Home Access: Stairs to enter Entrance Stairs-Number of Steps: 2 Entrance Stairs-Rails: Left;Right Home Layout: One level     Bathroom Shower/Tub: Producer, television/film/video: Standard     Home Equipment: Rollator (4 wheels);Cane - single point;Shower seat;Cane - quad;Grab bars - tub/shower;Rolling Environmental consultant (2 wheels)   Additional Comments: Pt reporting DME is not his but from a "friend" questionable historian, but saying family unreliable      Prior Functioning/Environment Prior Level of Function : Independent/Modified Independent     Mobility Comments: Use of SPC      OT Problem List: Decreased range of motion;Decreased strength;Decreased activity tolerance;Impaired balance (sitting and/or standing);Decreased safety awareness;Decreased cognition;Decreased knowledge of use of DME  or AE   OT Treatment/Interventions: Self-care/ADL training;Therapeutic exercise;Energy conservation;DME and/or AE instruction;Therapeutic activities;Patient/family education;Balance training      OT Goals(Current goals can be found in the care plan section)   Acute Rehab OT Goals Patient Stated Goal: To go home OT Goal Formulation: With patient Time For Goal Achievement: 04/09/24 Potential to Achieve Goals: Good   OT Frequency:  Min 2X/week       AM-PAC OT "6 Clicks" Daily Activity     Outcome Measure Help from another person eating meals?: None Help from another person taking care of personal grooming?: A Little Help from another person toileting, which includes using toliet, bedpan, or urinal?: A Little Help from another person bathing (including washing, rinsing, drying)?: A Little Help from another person to put on and taking off regular upper body clothing?: A Little Help from another person to put on and taking off regular lower body clothing?: A Little 6 Click Score: 19   End of Session Equipment Utilized During Treatment: Gait belt;Rolling walker (2 wheels) Nurse Communication: Mobility status  Activity Tolerance: Patient tolerated treatment well Patient left: in chair;with call bell/phone within reach;with chair alarm set;with nursing/sitter in room  OT Visit Diagnosis: Unsteadiness on feet (R26.81);Other abnormalities of gait and mobility (R26.89);Repeated falls (R29.6);Muscle weakness (generalized) (M62.81)                Time: 1610-9604 OT Time Calculation (min): 26 min Charges:  OT General Charges $OT Visit: 1 Visit OT Evaluation $OT Eval Low Complexity: 1 Low  Delmer Ferraris, OT  Acute Rehabilitation Services Office [phone] Secure chat preferred  Mickael Alamo 03/26/2024, 4:28 PM

## 2024-03-26 NOTE — Progress Notes (Signed)
 PROGRESS NOTE    Keith Drake  ZOX:[id number] DOB: 02-07-1935 DOA: 03/24/2024 PCP: Aldo Hun, MD    Chief Complaint  Patient presents with   Shaking    Brief Narrative:   Keith Drake is a 89 y.o. male with medical history significant for hypertension, insulin -dependent diabetes mellitus, CKD stage 3B, gout, bladder cancer, and heart block with pacemaker who presents for evaluation of shaking episodes.   Patient reports that he has been urinating more frequently the past couple days but had otherwise been in his usual state until this morning at approximately 11 AM when he had an episode of shaking that mainly involves his left arm and left torso.  This resolved spontaneously within approximately 1 minute.  The patient maintained awareness throughout the episode.  He went on to have recurrent episodes which prompted his family to call EMS.   Patient denies any pain, fever, chills, or shortness of breath.  He states that he typically has bilateral lower extremity swelling but not currently.   ED Course: Upon arrival to the ED, patient is found to be afebrile and saturating well on room air with severely elevated blood pressure.  EKG demonstrates sinus rhythm with PAC and chronic LBBB.  There are no acute findings on chest x-ray or head CT.  Labs are most notable for glucose 488, creatinine 3.05, bicarbonate 16, normal WBC, troponin 74, and no urine ketones.    ED physician discussed the case with neurology who advised treating the AKI, hyperglycemia, and hypertension, and formally consulting if his shaking episodes persist despite this.   Patient was given a liter of NS and 10 units subcutaneous NovoLog  in the ED.   Assessment & Plan:   Principal Problem:   Episode of shaking Active Problems:   Acute renal failure superimposed on stage 3b chronic kidney disease (HCC)   Type 2 diabetes mellitus (HCC)   Hypertensive urgency   Metabolic acidosis  Shaking episode/seizure-like  activity -Not clear of these events related to seizures, or events related to significantly elevated hyperglycemia, hypertension, AKI and acidosis. - EEG with no evidence of seizure activities - CT head with questionable finding in the right frontal anterior region, will proceed with MRI of the brain without contrast for further evaluation, fortunately MRI of the brain will be done till tomorrow given he has a pacemaker, as no pacemaker rep is available over the weekend  -PT, OT consulted    AKI superimposed on CKD 3B; metabolic acidosis  -Appears to be clinically dehydrated, baseline creatinine around 2, it is 3.05 on admission, treated with IV fluids, including bicarb drip, hold nephrotoxic medication. - Volume status has improved, appetite has improved as well, so we will hold on any further IV fluids today, will transition IV bicarb to p.o.  .  Hypertensive urgency  - Continue Norvasc , use IV labetalol  as needed   - Resumed home Norvasc , as well as low-dose Coreg given uncontrolled blood pressure .   Diabetes mellitus, type II, severely uncontrolled with hyperglycemia -Glucose significantly elevated 488 on presentation. - A1c is 11.3. - Severely uncontrolled, continue with home insulin  -Does not appear to be in DKA - CBG better controlled during hospital stay on lower dose insulin , so possibly dietary noncompliance contributing to hyperglycemia at home      DVT prophylaxis: Heparin  Code Status: full Family Communication: none at bedside Disposition:   Status is: Patient is currently observation, but he will need to stay 2 midnight as remains clinically dehydrated, acidosis, on bicarb and  IV fluids, and need of further workup for his acute findings.    Consultants:  none   Subjective: Keith Drake is feeling better today, good appetite  Objective: Vitals:   03/26/24 0326 03/26/24 0756 03/26/24 1012 03/26/24 1213  BP: 131/67 (!) 151/78 (!) 151/78 (!) 143/93  Pulse: 72 73 73 72   Resp: 13 11  17   Temp: 98.9 F (37.2 C) 98.3 F (36.8 C)  98.3 F (36.8 C)  TempSrc: Axillary Oral  Oral  SpO2: 99% 100%  99%  Weight:      Height:        Intake/Output Summary (Last 24 hours) at 03/26/2024 1311 Last data filed at 03/26/2024 7846 Gross per 24 hour  Intake 386.25 ml  Output 1100 ml  Net -713.75 ml   Filed Weights   03/24/24 1507 2024-04-10 0339  Weight: 83.9 kg 81.1 kg    Awake Alert, Oriented X 3, frail, hard of hearing Symmetrical Chest wall movement, Good air movement bilaterally, CTAB RRR,No Gallops,Rubs or new Murmurs, No Parasternal Heave +ve B.Sounds, Abd Soft, No tenderness, No rebound - guarding or rigidity. No Cyanosis, Clubbing or edema, No new Rash or bruise        Data Reviewed: I have personally reviewed following labs and imaging studies  CBC: Recent Labs  Lab 03/24/24 1548 03/24/24 1612 04-10-2024 0747 03/26/24 0243  WBC 10.0  --  8.8 8.6  NEUTROABS 5.0  --   --   --   HGB 12.7* 12.6* 11.6* 10.6*  HCT 39.1 37.0* 35.9* 33.5*  MCV 86.1  --  84.5 85.5  PLT 231  --  185 174    Basic Metabolic Panel: Recent Labs  Lab 03/24/24 1548 03/24/24 1612 04-10-2024 0747 03/26/24 0243  NA 137 138 141 136  K 5.1 5.0 4.2 4.4  CL 107  --  111 108  CO2 16*  --  20* 19*  GLUCOSE 488*  --  83 152*  BUN 44*  --  37* 35*  CREATININE 3.05*  --  2.38* 2.33*  CALCIUM  10.0  --  9.8 9.4  MG 2.3  --  2.1  --   PHOS  --   --  3.4  --     GFR: Estimated Creatinine Clearance: 23.1 mL/min (A) (by C-G formula based on SCr of 2.33 mg/dL (H)).  Liver Function Tests: Recent Labs  Lab 03/24/24 1548  AST 17  ALT 13  ALKPHOS 88  BILITOT 0.6  PROT 6.4*  ALBUMIN 3.4*    CBG: Recent Labs  Lab 04/10/2024 1548 10-Apr-2024 2108 03/26/24 0029 03/26/24 0715 03/26/24 1156  GLUCAP 243* 183* 155* 137* 184*     No results found for this or any previous visit (from the past 240 hours).       Radiology Studies: EEG adult Result Date:  04/10/24 Arleene Lack, MD     10-Apr-2024  3:47 PM Patient Name: Keith Drake MRN: [id number] Epilepsy Attending: Arleene Lack Referring Physician/Provider: Epifanio Haste, MD Date: 2024/04/10 Duration: 29.02 mins Patient history: 89yo M with seizure like episode. EEG to evaluate for seizure Level of alertness: Awake AEDs during EEG study: None Technical aspects: This EEG study was done with scalp electrodes positioned according to the 10-20 International system of electrode placement. Electrical activity was reviewed with band pass filter of 1-70Hz , sensitivity of 7 uV/mm, display speed of 45mm/sec with a 60Hz  notched filter applied as appropriate. EEG data were recorded continuously and digitally stored.  Video monitoring was  available and reviewed as appropriate. Description: The posterior dominant rhythm consists of 8 Hz activity of moderate voltage (25-35 uV) seen predominantly in posterior head regions, symmetric and reactive to eye opening and eye closing. Hyperventilation and photic stimulation were not performed.   IMPRESSION: This study is within normal limits. No seizures or epileptiform discharges were seen throughout the recording. A normal interictal EEG does not exclude the diagnosis of epilepsy. Priyanka Suzanne Erps   US  RENAL Result Date: 03/25/2024 EXAM: RETROPERITONEAL ULTRASOUND OF THE KIDNEYS AND URINARY BLADDER TECHNIQUE: Real-time ultrasonography of the retroperitoneum including the kidneys and urinary bladder was performed. COMPARISON: CT abdomen / pelvis dated 08/08/2023 CLINICAL HISTORY: Acute renal failure superimposed on stage 3b chronic kidney disease (HCC). FINDINGS: RIGHT KIDNEY: The right kidney measures 9.7 x 5.2 x 4.9 cm (calculated volume 129 ml). The right kidney demonstrates normal cortical echogenicity. No hydronephrosis or intrarenal stones. LEFT KIDNEY: The left kidney measures 9.5 x 5.5 x 5.3 cm (calculated volume 144 ml). The left kidney demonstrates normal  cortical echogenicity. No hydronephrosis or intrarenal stones. BLADDER: Unremarkable appearance of the bladder. IMPRESSION: 1. No acute findings. Electronically signed by: Zadie Herter MD 03/25/2024 12:31 AM EDT RP Workstation: [id number]   CT Head Wo Contrast Result Date: 03/24/2024 CLINICAL DATA:  Seizure EXAM: CT HEAD WITHOUT CONTRAST TECHNIQUE: Contiguous axial images were obtained from the base of the skull through the vertex without intravenous contrast. RADIATION DOSE REDUCTION: This exam was performed according to the departmental dose-optimization program which includes automated exposure control, adjustment of the mA and/or kV according to patient size and/or use of iterative reconstruction technique. COMPARISON:  Head CT 03/10/2021 FINDINGS: Brain: No hemorrhage or intracranial mass. Atrophy and chronic small vessel ischemic changes of the white matter. Hypodensity within the right inferior frontal region, series 3 image 16 and 17 and coronal series 5, image 52. Nonenlarged ventricles Vascular: No hyperdense vessels.  Carotid vascular calcification Skull: Normal. Negative for fracture or focal lesion. Sinuses/Orbits: Mucosal thickening in the sinuses. Other: None IMPRESSION: 1. Negative for acute intracranial hemorrhage or mass. 2. Atrophy and chronic small vessel ischemic changes of the white matter. Hypodensity within the right inferior frontal region, suspect may be related to artifact, though subtle area of edema is difficult to exclude on this exam. Electronically Signed   By: Esmeralda Hedge M.D.   On: 03/24/2024 18:52   DG Chest Portable 1 View Result Date: 03/24/2024 CLINICAL DATA:  Seizure. EXAM: PORTABLE CHEST 1 VIEW COMPARISON:  08/08/2023. FINDINGS: Low lung volumes. Cardiomediastinal silhouette is otherwise within normal limits. Stable left chest wall pacemaker. No focal consolidation, sizeable pleural effusion, or pneumothorax. No acute osseous abnormality. IMPRESSION: No acute  cardiopulmonary findings. Electronically Signed   By: Mannie Seek M.D.   On: 03/24/2024 18:01        Scheduled Meds:  amLODipine   10 mg Oral Daily   aspirin  EC  81 mg Oral Daily   carvedilol  3.125 mg Oral BID WC   heparin   5,000 Units Subcutaneous Q8H   insulin  aspart  0-5 Units Subcutaneous QHS   insulin  aspart  0-9 Units Subcutaneous TID WC   insulin  aspart  3 Units Subcutaneous TID WC   insulin  glargine-yfgn  10 Units Subcutaneous QHS   pantoprazole   40 mg Oral Daily   sodium chloride  flush  3 mL Intravenous Q12H   Continuous Infusions:  sodium bicarbonate  150 mEq in sterile water  1,150 mL infusion 75 mL/hr at 03/25/24 1802     LOS: 1 day  Seena Dadds, MD [facility]   To contact the attending provider between 7A-7P or the covering provider during after hours 7P-7A, please log into the web site [email or web address] and access using universal [place] password for that web site. If you do not have the password, please call the hospital operator.  03/26/2024, 1:11 PM

## 2024-03-27 ENCOUNTER — Inpatient Hospital Stay (HOSPITAL_COMMUNITY)

## 2024-03-27 DIAGNOSIS — R251 Tremor, unspecified: Secondary | ICD-10-CM | POA: Diagnosis not present

## 2024-03-27 LAB — BASIC METABOLIC PANEL WITH GFR
Anion gap: 6 (ref 5–15)
BUN: 38 mg/dL — ABNORMAL HIGH (ref 8–23)
CO2: 21 mmol/L — ABNORMAL LOW (ref 22–32)
Calcium: 9.4 mg/dL (ref 8.9–10.3)
Chloride: 106 mmol/L (ref 98–111)
Creatinine, Ser: 2.93 mg/dL — ABNORMAL HIGH (ref 0.61–1.24)
GFR, Estimated: 20 mL/min — ABNORMAL LOW (ref >60)
Glucose, Bld: 111 mg/dL — ABNORMAL HIGH (ref 70–99)
Potassium: 4.7 mmol/L (ref 3.5–5.1)
Sodium: 133 mmol/L — ABNORMAL LOW (ref 135–145)

## 2024-03-27 LAB — CBC
HCT: 34.2 % — ABNORMAL LOW (ref 39.0–52.0)
Hemoglobin: 11.1 g/dL — ABNORMAL LOW (ref 13.0–17.0)
MCH: 27.7 pg (ref 26.0–34.0)
MCHC: 32.5 g/dL (ref 30.0–36.0)
MCV: 85.3 fL (ref 80.0–100.0)
Platelets: 188 10*3/uL (ref 150–400)
RBC: 4.01 MIL/uL — ABNORMAL LOW (ref 4.22–5.81)
RDW: 15.3 % (ref 11.5–15.5)
WBC: 8.4 10*3/uL (ref 4.0–10.5)
nRBC: 0 % (ref 0.0–0.2)

## 2024-03-27 LAB — GLUCOSE, CAPILLARY
Glucose-Capillary: 114 mg/dL — ABNORMAL HIGH (ref 70–99)
Glucose-Capillary: 184 mg/dL — ABNORMAL HIGH (ref 70–99)
Glucose-Capillary: 158 mg/dL — ABNORMAL HIGH (ref 70–99)
Glucose-Capillary: 215 mg/dL — ABNORMAL HIGH (ref 70–99)

## 2024-03-27 MED ORDER — TAMSULOSIN HCL 0.4 MG PO CAPS
0.4000 mg | ORAL_CAPSULE | Freq: Every day | ORAL | Status: DC
  Administered 2024-03-27: 0.4 mg via ORAL
  Filled 2024-03-27: qty 1

## 2024-03-27 MED ORDER — SODIUM CHLORIDE 0.9 % IV SOLN: INTRAVENOUS | Status: AC

## 2024-03-27 NOTE — Progress Notes (Signed)
 PROGRESS NOTE    Keith Drake  XBJ:[id number] DOB: June 07, 1935 DOA: 03/24/2024 PCP: Aldo Hun, MD    Chief Complaint  Patient presents with   Shaking    Brief Narrative:   Keith Drake is a 89 y.o. male with medical history significant for hypertension, insulin -dependent diabetes mellitus, CKD stage 3B, gout, bladder cancer, and heart block with pacemaker who presents for evaluation of shaking episodes.   Patient reports that he has been urinating more frequently the past couple days but had otherwise been in his usual state until this morning at approximately 11 AM when he had an episode of shaking that mainly involves his left arm and left torso.  This resolved spontaneously within approximately 1 minute.  The patient maintained awareness throughout the episode.  He went on to have recurrent episodes which prompted his family to call EMS.   Patient denies any pain, fever, chills, or shortness of breath.  He states that he typically has bilateral lower extremity swelling but not currently.   ED Course: Upon arrival to the ED, patient is found to be afebrile and saturating well on room air with severely elevated blood pressure.  EKG demonstrates sinus rhythm with PAC and chronic LBBB.  There are no acute findings on chest x-ray or head CT.  Labs are most notable for glucose 488, creatinine 3.05, bicarbonate 16, normal WBC, troponin 74, and no urine ketones.    ED physician discussed the case with neurology who advised treating the AKI, hyperglycemia, and hypertension, and formally consulting if his shaking episodes persist despite this.   Patient was given a liter of NS and 10 units subcutaneous NovoLog  in the ED.   Assessment & Plan:   Principal Problem:   Episode of shaking Active Problems:   Acute renal failure superimposed on stage 3b chronic kidney disease (HCC)   Type 2 diabetes mellitus (HCC)   Hypertensive urgency   Metabolic acidosis  Shaking episode/seizure-like  activity -Not clear of these events related to seizures, or events related to significantly elevated hyperglycemia, hypertension, AKI and acidosis. - EEG with no evidence of seizure activities - CT head with questionable finding in the right frontal anterior region, will proceed with MRI of the brain without contrast for further evaluation, given pacemaker, MRI could not be done over the weekend, will be done today as discussed with MRI team. -PT, OT consulted    AKI superimposed on CKD 3B; metabolic acidosis  -Appears to be clinically dehydrated, baseline creatinine around 2, it is 3.05 on admission, treated with IV fluids, including bicarb drip, hold nephrotoxic medication. - Volume status has improved, appetite has improved as well, so we will hold on any further IV fluids today, will transition IV bicarb to p.o.  - Pain is up to 2.9 this morning, no evidence of retention, will resume IV fluids..  Hypertensive urgency  - Continue Norvasc , use IV labetalol  as needed   - Resumed home Norvasc , as well as low-dose Coreg given uncontrolled blood pressure .   Diabetes mellitus, type II, severely uncontrolled with hyperglycemia -Glucose significantly elevated 488 on presentation. - A1c is 11.3. - Severely uncontrolled, continue with home insulin  -Does not appear to be in DKA - CBG better controlled during hospital stay on lower dose insulin , so possibly dietary noncompliance contributing to hyperglycemia at home      DVT prophylaxis: Heparin  Code Status: full Family Communication: none at bedside Disposition:       Consultants:  none   Subjective: No significant events  overnight, he denies any complaints  Objective: Vitals:   03/26/24 2300 03/27/24 0823 03/27/24 0845 03/27/24 1200  BP: 136/82  132/76 (!) 142/83  Pulse: 70  99 72  Resp: 18  (!) 22 17  Temp: 98.2 F (36.8 C) 98.8 F (37.1 C)  98.2 F (36.8 C)  TempSrc: Oral Oral  Oral  SpO2: 96%  99% 99%  Weight:       Height:        Intake/Output Summary (Last 24 hours) at 03/27/2024 1431 Last data filed at 03/27/2024 0636 Gross per 24 hour  Intake --  Output 900 ml  Net -900 ml   Filed Weights   03/24/24 1507 03/25/24 0339  Weight: 83.9 kg 81.1 kg    Awake Alert, Oriented X 3, No new F.N deficits, Normal affect Symmetrical Chest wall movement, Good air movement bilaterally, CTAB RRR,No Gallops,Rubs or new Murmurs, No Parasternal Heave +ve B.Sounds, Abd Soft, No tenderness, No rebound - guarding or rigidity. No Cyanosis, Clubbing or edema, No new Rash or bruise       Data Reviewed: I have personally reviewed following labs and imaging studies  CBC: Recent Labs  Lab 03/24/24 1548 03/24/24 1612 03/25/24 0747 03/26/24 0243 03/27/24 0406  WBC 10.0  --  8.8 8.6 8.4  NEUTROABS 5.0  --   --   --   --   HGB 12.7* 12.6* 11.6* 10.6* 11.1*  HCT 39.1 37.0* 35.9* 33.5* 34.2*  MCV 86.1  --  84.5 85.5 85.3  PLT 231  --  185 174 188    Basic Metabolic Panel: Recent Labs  Lab 03/24/24 1548 03/24/24 1612 03/25/24 0747 03/26/24 0243 03/27/24 0406  NA 137 138 141 136 133*  K 5.1 5.0 4.2 4.4 4.7  CL 107  --  111 108 106  CO2 16*  --  20* 19* 21*  GLUCOSE 488*  --  83 152* 111*  BUN 44*  --  37* 35* 38*  CREATININE 3.05*  --  2.38* 2.33* 2.93*  CALCIUM  10.0  --  9.8 9.4 9.4  MG 2.3  --  2.1  --   --   PHOS  --   --  3.4  --   --     GFR: Estimated Creatinine Clearance: 18.3 mL/min (A) (by C-G formula based on SCr of 2.93 mg/dL (H)).  Liver Function Tests: Recent Labs  Lab 03/24/24 1548  AST 17  ALT 13  ALKPHOS 88  BILITOT 0.6  PROT 6.4*  ALBUMIN 3.4*    CBG: Recent Labs  Lab 03/26/24 1156 03/26/24 1510 03/26/24 2106 03/27/24 0822 03/27/24 1251  GLUCAP 184* 110* 139* 114* 184*     No results found for this or any previous visit (from the past 240 hours).       Radiology Studies: No results found.       Scheduled Meds:  amLODipine   10 mg Oral Daily    aspirin  EC  81 mg Oral Daily   carvedilol  3.125 mg Oral BID WC   heparin   5,000 Units Subcutaneous Q8H   insulin  aspart  0-5 Units Subcutaneous QHS   insulin  aspart  0-9 Units Subcutaneous TID WC   insulin  aspart  3 Units Subcutaneous TID WC   insulin  glargine-yfgn  10 Units Subcutaneous QHS   pantoprazole   40 mg Oral Daily   sodium bicarbonate   650 mg Oral BID   sodium chloride  flush  3 mL Intravenous Q12H   Continuous Infusions:  sodium chloride   LOS: 2 days       Seena Dadds, MD [facility]   To contact the attending provider between 7A-7P or the covering provider during after hours 7P-7A, please log into the web site [email or web address] and access using universal [place] password for that web site. If you do not have the password, please call the hospital operator.  03/27/2024, 2:31 PM

## 2024-03-27 NOTE — NC FL2 (Addendum)
 [place]  MEDICAID FL2 LEVEL OF CARE FORM     IDENTIFICATION  Patient Name: Keith Drake Birthdate: 05-Oct-1935 Sex: male Admission Date (Current Location): 03/24/2024  [facility] and [place] Number:  Producer, television/film/video and Address:  [facility], [address] [place], [place] [address]      Provider Number: [id number]  Attending Physician Name and Address:  Elgergawy, Ardia Kraft, MD  Relative Name and Phone Number:       Current Level of Care: Hospital Recommended Level of Care: Skilled Nursing Facility Prior Approval Number:    Date Approved/Denied:   PASRR Number: [id number]  Discharge Plan: SNF    Current Diagnoses: Patient Active Problem List   Diagnosis Date Noted   Episode of shaking 03/24/2024   Hypertensive urgency 03/24/2024   Metabolic acidosis 03/24/2024   Hyperosmolar hyperglycemic state (HHS) (HCC) 08/08/2023   Hypercalcemia 07/20/2023   Acute pancreatitis 07/19/2023   Impairment of balance 07/19/2023   Type 2 diabetes mellitus (HCC) 07/19/2023   Acute renal failure superimposed on stage 3b chronic kidney disease (HCC) 07/19/2023   Anemia in chronic renal disease 07/19/2023   Mild protein malnutrition (HCC) 07/19/2023   Rosacea 07/19/2023   Infective urethritis 08/30/2018   Spondylolisthesis at L4-L5 level 08/15/2018   Symptomatic bradycardia 07/20/2018   Second degree AV block, Mobitz type I 07/20/2018   Essential hypertension 03/23/2013   Hyperlipidemia 03/23/2013   Insulin  dependent diabetes mellitus 03/23/2013   Right bundle branch block     Orientation RESPIRATION BLADDER Height & Weight     Self, Time, Situation, Place  Normal Continent, External catheter Weight: 178 lb 12.7 oz (81.1 kg) Height:  5\' 10"  (177.8 cm)  BEHAVIORAL SYMPTOMS/MOOD NEUROLOGICAL BOWEL NUTRITION STATUS        Diet (See dc summary)  AMBULATORY STATUS COMMUNICATION OF NEEDS Skin   Limited Assist Verbally Normal                        Personal Care Assistance Level of Assistance  Bathing, Feeding, Dressing Bathing Assistance: Limited assistance Feeding assistance: Independent Dressing Assistance: Limited assistance     Functional Limitations Info             SPECIAL CARE FACTORS FREQUENCY  PT (By licensed PT), OT (By licensed OT)     PT Frequency: 5x/week OT Frequency: 5x/week            Contractures Contractures Info: Not present    Additional Factors Info  Code Status, Allergies Code Status Info: Full Allergies Info: Donepezil, Fosinopril, Influenza Virus Vaccine, Oxycodone , Rosuvastatin , Tadalafil, Valsartan, Fenofibrate           Current Medications (03/27/2024):  This is the current hospital active medication list Current Facility-Administered Medications  Medication Dose Route Frequency Provider Last Rate Last Admin   0.9 %  sodium chloride  infusion   Intravenous Continuous Elgergawy, Dawood S, MD 100 mL/hr at 03/27/24 1500 New Bag at 03/27/24 1500   acetaminophen  (TYLENOL ) tablet 650 mg  650 mg Oral Q6H PRN Opyd, Timothy S, MD       Or   acetaminophen  (TYLENOL ) suppository 650 mg  650 mg Rectal Q6H PRN Opyd, Timothy S, MD       amLODipine  (NORVASC ) tablet 10 mg  10 mg Oral Daily Opyd, Timothy S, MD   10 mg at 03/27/24 0846   aspirin  EC tablet 81 mg  81 mg Oral Daily Opyd, Timothy S, MD   81 mg  at 03/27/24 0846   carvedilol (COREG) tablet 3.125 mg  3.125 mg Oral BID WC Elgergawy, Dawood S, MD   3.125 mg at 03/27/24 0846   heparin  injection 5,000 Units  5,000 Units Subcutaneous Q8H Opyd, Timothy S, MD   5,000 Units at 03/27/24 1428   insulin  aspart (novoLOG ) injection 0-5 Units  0-5 Units Subcutaneous QHS Opyd, Timothy S, MD       insulin  aspart (novoLOG ) injection 0-9 Units  0-9 Units Subcutaneous TID WC Opyd, Timothy S, MD   2 Units at 03/27/24 1300   insulin  aspart (novoLOG ) injection 3 Units  3 Units Subcutaneous TID WC Opyd, Timothy S, MD   3 Units at 03/27/24 1300   insulin   glargine-yfgn (SEMGLEE ) injection 10 Units  10 Units Subcutaneous QHS Opyd, Timothy S, MD   10 Units at 03/26/24 2112   labetalol  (NORMODYNE ) injection 10 mg  10 mg Intravenous Q4H PRN Opyd, Timothy S, MD       pantoprazole  (PROTONIX ) EC tablet 40 mg  40 mg Oral Daily Opyd, Timothy S, MD   40 mg at 03/27/24 0846   sodium bicarbonate  tablet 650 mg  650 mg Oral BID Elgergawy, Dawood S, MD   650 mg at 03/27/24 0846   sodium chloride  flush (NS) 0.9 % injection 3 mL  3 mL Intravenous Q12H Opyd, Santana Cue, MD   3 mL at 03/27/24 1000   tamsulosin (FLOMAX) capsule 0.4 mg  0.4 mg Oral QPC supper Elgergawy, Dawood S, MD         Discharge Medications: Please see discharge summary for a list of discharge medications.  Relevant Imaging Results:  Relevant Lab Results:   Additional Information ssn: [phone] [place], LCSW

## 2024-03-27 NOTE — Progress Notes (Signed)
 Physical Therapy Treatment Patient Details Name: Keith Drake MRN: [id number] DOB: 1934-12-25 Today's Date: 03/27/2024   History of Present Illness 89 y.o. male who presents for evaluation of shaking episodes.  CT head with questionable finding in the right frontal anterior region PMH: hypertension, insulin -dependent diabetes mellitus, CKD stage 3B, gout, bladder cancer, and heart block with pacemaker.    PT Comments  Further clarification provided by pt today, he does not have reliable support at home as previously stated. He required min assist to stand after multiple attempt to perform on his own. Ambulates forward with RW at a CGA level, but required min assist for LOB while backing up prior to sitting. Educated on safety, reviewed AD use and need at all times for gait. States he was not using assistive device at home. Seems to be weaker than baseline, increased fall risk, and lives alone. Patient will benefit from continued inpatient follow up therapy, <3 hours/day. Will continue to progress as tolerated during acute admission.    If plan is discharge home, recommend the following: A little help with walking and/or transfers;A little help with bathing/dressing/bathroom;Assistance with cooking/housework;Assist for transportation;Help with stairs or ramp for entrance   Can travel by private vehicle     Yes  Equipment Recommendations  None recommended by PT    Recommendations for Other Services       Precautions / Restrictions Precautions Precautions: Fall Recall of Precautions/Restrictions: Impaired Restrictions Weight Bearing Restrictions Per Provider Order: No     Mobility  Bed Mobility               General bed mobility comments: In recliner    Transfers Overall transfer level: Needs assistance Equipment used: Rolling walker (2 wheels) Transfers: Sit to/from Stand Sit to Stand: Min assist           General transfer comment: Multiple attempts to stand on his  own but ultimately required min assist for boost out of recliner. Hands in good place, RW set -up appropriately. Slow and effortful rise, rocks for momentum.    Ambulation/Gait Ambulation/Gait assistance: Min assist Gait Distance (Feet): 150 Feet Assistive device: Rolling walker (2 wheels), None Gait Pattern/deviations: Step-through pattern, Decreased stride length, Trunk flexed, Antalgic, Shuffle Gait velocity: dec Gait velocity interpretation: <1.8 ft/sec, indicate of risk for recurrent falls   General Gait Details: Forward gait with RW at CGA level majority of distance however required min assist for LOB while backing up. Cues for upright posture and walker placement for proximity and maximum support. Short distance without AD at close CGA, shows exacerbation of shorter shuffled steps, and does not feel confident without device. Educated on safety, awareness, and AD use.   Stairs             Wheelchair Mobility     Tilt Bed    Modified Rankin (Stroke Patients Only)       Balance Overall balance assessment: Needs assistance Sitting-balance support: No upper extremity supported, Feet supported Sitting balance-[name]: Good     Standing balance support: Bilateral upper extremity supported Standing balance-[name]: Poor Standing balance comment: Reliant on RW                            Communication Communication Communication: Impaired Factors Affecting Communication: Hearing impaired  Cognition Arousal: Alert Behavior During Therapy: WFL for tasks assessed/performed   PT - Cognitive impairments: No family/caregiver present to determine baseline, Safety/Judgement  Following commands: Impaired Following commands impaired: Follows one step commands with increased time, Only follows one step commands consistently    Cueing Cueing Techniques: Verbal cues  Exercises General Exercises - Lower Extremity Long Arc  Quad: Strengthening, Both, 10 reps, Seated    General Comments General comments (skin integrity, edema, etc.): VSS      Pertinent Vitals/Pain Pain Assessment Pain Assessment: Faces Faces Pain Scale: Hurts a little bit Pain Location: lower back Pain Descriptors / Indicators: Aching Pain Intervention(s): Monitored during session, Repositioned    Home Living                          Prior Function            PT Goals (current goals can now be found in the care plan section) Acute Rehab PT Goals Patient Stated Goal: Go home PT Goal Formulation: With patient Time For Goal Achievement: 04/07/24 Potential to Achieve Goals: Good Progress towards PT goals: Progressing toward goals    Frequency    Min 2X/week      PT Plan      Co-evaluation              AM-PAC PT "6 Clicks" Mobility   Outcome Measure  Help needed turning from your back to your side while in a flat bed without using bedrails?: None Help needed moving from lying on your back to sitting on the side of a flat bed without using bedrails?: A Little Help needed moving to and from a bed to a chair (including a wheelchair)?: A Little Help needed standing up from a chair using your arms (e.g., wheelchair or bedside chair)?: A Little Help needed to walk in hospital room?: A Little Help needed climbing 3-5 steps with a railing? : A Lot 6 Click Score: 18    End of Session Equipment Utilized During Treatment: Gait belt Activity Tolerance: Patient tolerated treatment well Patient left: in chair;with call bell/phone within reach;with chair alarm set Nurse Communication: Mobility status PT Visit Diagnosis: Unsteadiness on feet (R26.81);Muscle weakness (generalized) (M62.81);Difficulty in walking, not elsewhere classified (R26.2);Other symptoms and signs involving the nervous system (R29.898)     Time: 1610-9604 PT Time Calculation (min) (ACUTE ONLY): 22 min  Charges:    $Gait Training: 8-22  mins PT General Charges $$ ACUTE PT VISIT: 1 Visit                     Jory Ng, PT, DPT [facility]  Rehabilitation Services Physical Therapist Office: [phone] Website: [email or web address]    Alinda Irani 03/27/2024, 12:49 PM

## 2024-03-27 NOTE — Plan of Care (Signed)
  Problem: Education: Goal: Individualized Educational Video(s) Outcome: Progressing   Problem: Coping: Goal: Ability to adjust to condition or change in health will improve Outcome: Progressing   Problem: Education: Goal: Knowledge of General Education information will improve Description: Including pain rating scale, medication(s)/side effects and non-pharmacologic comfort measures Outcome: Progressing   Problem: Clinical Measurements: Goal: Diagnostic test results will improve Outcome: Progressing Goal: Cardiovascular complication will be avoided Outcome: Progressing   Problem: Activity: Goal: Risk for activity intolerance will decrease Outcome: Progressing   Problem: Coping: Goal: Level of anxiety will decrease Outcome: Progressing

## 2024-03-27 NOTE — TOC Initial Note (Signed)
 Transition of Care [facility]) - Initial/Assessment Note    Patient Details  Name: Keith Drake MRN: [id number] Date of Birth: May 08, 1935  Transition of Care [facility]) CM/SW Contact:    Jannice Mends, LCSW Phone Number: 03/27/2024, 4:35 PM  Clinical Narrative:                 CSW received consult for possible SNF placement at time of discharge. CSW spoke with patient's son. Patient's son reported that patient lives alone and son lives behind his house. Patient has a cane and walker. Patient's son expressed understanding of PT recommendation and is agreeable to SNF placement at time of discharge if insurance covers it. Otherwise he is accepting of home health services. He stated [facility] is acceptable for the search area. CSW discussed insurance authorization process and will provide Medicare SNF ratings list. CSW will send out referrals for review and provide bed offers as available.   Skilled Nursing Rehab Facilities-   [email or web address]   Ratings out of 5 stars (5 the highest)  Name Address  Phone # Quality Care Staffing Health Inspection Overall  [facility] [address] [phone] 3 1 4 3   [facility] [address], [place] [phone] 5 2 4 5   [facility] [address], [facility] [phone] [facility] [address], [place] [phone] 4 3 4 4   [facility]  [address], [place] [phone] 5 3 5 5   [facility] [address] [facility] [phone] 5 3 2 3   [facility] [address] [place] [phone] 5 1 2 2   [facility] [phone] [address], [place] [phone] 2 3 3 3   [address] ([place]) [address] [place] [phone] 1 3 3 2   [facility] [address] [place], [place] [phone] 3 1 2 1   [facility] [address], [place] [phone] 3 1 1 1   [facility] [address]  [place] [phone] 3 3 4 4   [facility] [address], [place] [phone] 4 4 3 3   [facility]) [address], [place] [phone] 1 1 4 2           [facility] [address], [place] [phone] 3 1 5 4   [facility] [address], [place] [phone] 4 1 1 1   [facility]  [address] [place] [phone] 2 3 2 2   [facility] [place] [address] [phone] 2 2 4 4   [facility] [address], [place] [phone] 2 2 3 3           [facility] [address] [place] [phone] 2 1 2 1   [facility] (No [facility]) [address], [place] [phone] 5 4 5 5   [facility] [address], [facility] [phone] 3 4 2 2   [facility] [address], [place] [phone] 3 1 2 1   [facility] [address] [place] [phone] 3 2 2 2   [facility] [address] [place] [phone] 1 3 3 2   [facility] [address], [place] [phone] 2 2 3 3   [facility] [address] [place], [place] [phone] 2 1 4 3   [facility] [address], [facility] [phone] 2 1 1 1   [facility] [address], [place] [phone] 2 1 2 1   [facility] [address] [place] [phone] 3 1 2 1           [facility] [address] [place] [phone] 4 1 2 1   [facility] [address], [place]  [phone] 3 4 4 4   [facility] (No [employer]) [address], [place] [phone] 3 2 4 4   [facility]) [address], [place] [phone] 2 2 3 3   [facility] [address] [place] [phone] 5 3 5 5   [facility] [address], [place] [phone] 2 1 1 1   [facility] [address] [place], [place] [phone] 3 5 5 5           [facility] [address], [place] [phone] 5 4 5  5   [facility])  [address] [place] [phone] 1 1 2 1   [facility]) [address] [place] [phone]  2 4 4   [facility] [address] [place] [phone] 3 5 5 5   [address] [place], [place] [phone] 4 2 2 2   [facility]) [address] [place] [phone] 1 1 3 1      Expected Discharge Plan: Skilled Nursing Facility Barriers to Discharge: Continued Medical Work up, English as a second language teacher, SNF Pending bed offer   Patient Goals and CMS Choice Patient states their goals for this hospitalization and ongoing recovery are:: Rehab CMS Medicare.gov Compare Post Acute Care list provided to:: Patient Represenative (must comment) Choice offered to / list presented to : Adult Children [place] ownership interest in [facility].provided to:: Adult Children    Expected Discharge Plan and Services In-house Referral: Clinical Social Work   Post Acute Care Choice: Skilled Nursing Facility Living arrangements for the past 2 months: Single Family Home                                      Prior Living Arrangements/Services Living arrangements for the past 2 months: Single Family Home Lives with:: Self Patient language and need for interpreter reviewed:: Yes Do you feel safe going back to the place where you live?: Yes      Need for Family Participation in Patient Care: Yes (Comment) Care giver support system in place?: Yes (comment) Current home services: DME (walker, cane) Criminal Activity/Legal Involvement Pertinent to Current Situation/Hospitalization: No - Comment as needed  Activities of Daily Living   ADL Screening (condition at time of admission) Independently performs ADLs?: No Does the patient have a NEW difficulty with bathing/dressing/toileting/self-feeding that is expected to last >3 days?: Yes (Initiates electronic notice to provider for  possible OT consult) Does the patient have a NEW difficulty with getting in/out of bed, walking, or climbing stairs that is expected to last >3 days?: Yes (Initiates electronic notice to provider for possible PT consult) Does the patient have a NEW difficulty with communication that is expected to last >3 days?: No Is the patient deaf or have difficulty hearing?: Yes Does the patient have difficulty seeing, even when wearing glasses/contacts?: No Does the patient have difficulty concentrating, remembering, or making decisions?: Yes  Permission Sought/Granted Permission sought to share information with : Facility Medical sales representative, Family Supports Permission granted to share information with : Yes, Verbal Permission Granted  Share Information with NAME: Sherolyn Dixon  Permission granted to share info w AGENCY: SNFs  Permission granted to share  info w Relationship: Son  Permission granted to share info w Contact Information: [phone]  Emotional Assessment Appearance:: Appears stated age     Orientation: : Oriented to Self, Oriented to Place, Oriented to  Time, Oriented to Situation Alcohol / Substance Use: Not Applicable Psych Involvement: No (comment)  Admission diagnosis:  Spasm [R25.2] Hyperglycemia [R73.9] Episode of shaking [R25.1] AKI (acute kidney injury) (HCC) [N17.9] Patient Active Problem List   Diagnosis Date Noted   Episode of shaking 03/24/2024   Hypertensive urgency 03/24/2024   Metabolic acidosis 03/24/2024   Hyperosmolar hyperglycemic state (HHS) (HCC) 08/08/2023   Hypercalcemia 07/20/2023   Acute pancreatitis 07/19/2023   Impairment of balance 07/19/2023   Type 2 diabetes mellitus (HCC) 07/19/2023   Acute renal failure superimposed on stage 3b chronic kidney disease (HCC) 07/19/2023   Anemia in chronic renal disease 07/19/2023   Mild protein malnutrition (HCC) 07/19/2023   Rosacea 07/19/2023   Infective urethritis 08/30/2018   Spondylolisthesis at L4-L5  level 08/15/2018   Symptomatic bradycardia 07/20/2018   Second degree AV block, Mobitz type I 07/20/2018   Essential hypertension 03/23/2013   Hyperlipidemia 03/23/2013   Insulin  dependent diabetes mellitus 03/23/2013   Right bundle branch block    PCP:  Aldo Hun, MD Pharmacy:   [facility] [phone] - [place], [place] - [address] AT [facility] [address] [place] [address] Phone: [phone] Fax: [phone]     Social Drivers of Health (SDOH) Social History: SDOH Screenings   Food Insecurity: No Food Insecurity (03/25/2024)  Housing: Unknown (03/25/2024)  Transportation Needs: No Transportation Needs (03/25/2024)  Utilities: Not At Risk (03/25/2024)  Social Connections: Socially Isolated (03/25/2024)  Tobacco Use: Medium Risk (03/24/2024)   SDOH Interventions:     Readmission Risk Interventions    08/10/2023   12:29 PM  Readmission Risk Prevention Plan  Transportation Screening Complete  PCP or Specialist Appt within 5-7 Days Complete  Home Care Screening Complete  Medication Review (RN CM) Complete

## 2024-03-27 NOTE — CV Procedure (Signed)
  Device system confirmed to be MRI conditional, with implant date > 6 weeks ago, and no evidence of abandoned or epicardial leads in review of most recent CXR  Device last cleared by EP Provider: Michaelle Adolphus, PA 03/27/24   Clearance is good through for 1 year as long as parameters remain stable at time of check. If pt undergoes a cardiac device procedure during that time, they should be re-cleared.   Tachy-therapies to be programmed off if applicable with device back to pre-MRI settings after completion of exam.  [employer] will be present for programming for the MRI.   Buster Cash, RT  03/27/2024 1:43 PM

## 2024-03-28 ENCOUNTER — Other Ambulatory Visit (HOSPITAL_COMMUNITY): Payer: Self-pay

## 2024-03-28 ENCOUNTER — Inpatient Hospital Stay (HOSPITAL_COMMUNITY)

## 2024-03-28 DIAGNOSIS — R251 Tremor, unspecified: Secondary | ICD-10-CM | POA: Diagnosis not present

## 2024-03-28 LAB — BASIC METABOLIC PANEL WITH GFR
Anion gap: 11 (ref 5–15)
BUN: 44 mg/dL — ABNORMAL HIGH (ref 8–23)
CO2: 16 mmol/L — ABNORMAL LOW (ref 22–32)
Calcium: 10 mg/dL (ref 8.9–10.3)
Chloride: 109 mmol/L (ref 98–111)
Creatinine, Ser: 2.7 mg/dL — ABNORMAL HIGH (ref 0.61–1.24)
GFR, Estimated: 22 mL/min — ABNORMAL LOW (ref >60)
Glucose, Bld: 139 mg/dL — ABNORMAL HIGH (ref 70–99)
Potassium: 5 mmol/L (ref 3.5–5.1)
Sodium: 136 mmol/L (ref 135–145)

## 2024-03-28 LAB — CBC
HCT: 37 % — ABNORMAL LOW (ref 39.0–52.0)
Hemoglobin: 11.9 g/dL — ABNORMAL LOW (ref 13.0–17.0)
MCH: 27.6 pg (ref 26.0–34.0)
MCHC: 32.2 g/dL (ref 30.0–36.0)
MCV: 85.8 fL (ref 80.0–100.0)
Platelets: 206 10*3/uL (ref 150–400)
RBC: 4.31 MIL/uL (ref 4.22–5.81)
RDW: 15.2 % (ref 11.5–15.5)
WBC: 8.9 10*3/uL (ref 4.0–10.5)
nRBC: 0 % (ref 0.0–0.2)

## 2024-03-28 LAB — GLUCOSE, CAPILLARY
Glucose-Capillary: 147 mg/dL — ABNORMAL HIGH (ref 70–99)
Glucose-Capillary: 190 mg/dL — ABNORMAL HIGH (ref 70–99)

## 2024-03-28 LAB — PHOSPHORUS: Phosphorus: 3.8 mg/dL (ref 2.5–4.6)

## 2024-03-28 LAB — MAGNESIUM: Magnesium: 2.1 mg/dL (ref 1.7–2.4)

## 2024-03-28 MED ORDER — TAMSULOSIN HCL 0.4 MG PO CAPS
0.4000 mg | ORAL_CAPSULE | Freq: Every day | ORAL | 0 refills | Status: AC
  Filled 2024-03-28: qty 30, 30d supply, fill #0

## 2024-03-28 MED ORDER — CARVEDILOL 3.125 MG PO TABS
3.1250 mg | ORAL_TABLET | Freq: Two times a day (BID) | ORAL | 0 refills | Status: DC
  Filled 2024-03-28: qty 60, 30d supply, fill #0

## 2024-03-28 MED ORDER — SODIUM ZIRCONIUM CYCLOSILICATE 10 G PO PACK
10.0000 g | PACK | Freq: Once | ORAL | Status: AC
  Administered 2024-03-28: 10 g via ORAL
  Filled 2024-03-28: qty 1

## 2024-03-28 MED ORDER — TRESIBA FLEXTOUCH 100 UNIT/ML ~~LOC~~ SOPN: 20.0000 [IU] | PEN_INJECTOR | Freq: Every day | SUBCUTANEOUS | Status: AC

## 2024-03-28 NOTE — Discharge Summary (Signed)
 Physician Discharge Summary  Daveion Robar OZH:[id number] DOB: 12/31/1934 DOA: 03/24/2024  PCP: Aldo Hun, MD  Admit date: 03/24/2024 Discharge date: 03/28/2024  Admitted From: (Home) Disposition:  (Home )  Recommendations for Outpatient Follow-up:  Follow up with PCP in 1-2 weeks Please obtain BMP/CBC in one week   Home Health: (YES) patient reports he would like to go home with home health and would not like to go to SNF  Diet recommendation: Heart Healthy / Carb Modified  Brief/Interim Summary: Keith Drake is a 89 y.o. male with medical history significant for hypertension, insulin -dependent diabetes mellitus, CKD stage 3B, gout, bladder cancer, and heart block with pacemaker who presents for evaluation of shaking episodes.   Patient reports that he has been urinating more frequently the past couple days but had otherwise been in his usual state until this morning at approximately 11 AM when he had an episode of shaking that mainly involves his left arm and left torso.  This resolved spontaneously within approximately 1 minute.  The patient maintained awareness throughout the episode.  He went on to have recurrent episodes which prompted his family to call EMS.    Upon arrival to the ED, patient is found to be afebrile and saturating well on room air with severely elevated blood pressure.  EKG demonstrates sinus rhythm with PAC and chronic LBBB.  There are no acute findings on chest x-ray or head CT.  Labs are most notable for glucose 488, creatinine 3.05, bicarbonate 16, normal WBC, troponin 74, and no urine ketones.Aaron Aas  Hospitalist consulted to admit for further management.       Shaking episode/seizure-like activity - Not appear these events related to any seizures, but clear etiology is not clear, but this is most likely related to dehydration, AKI, acidosis, significantly uncontrolled diabetes mellitus with hyperglycemia and uncontrolled hypertension.  - EEG with no evidence of  seizure activities - CT head with questionable finding in the right frontal anterior region, will proceed with MRI of the brain without contrast for further evaluation, given pacemaker, MRI could not be done over the weekend, but has been done on 6/2, MRI of the brain with no acute intracranial findings, and no focal abnormalities or evidence of any edema in the inferior right frontal lobe, was only significant for moderate chronic microvascular ischemic changes and parenchymal volume loss -PT, OT consulted, initial recommendation for SNF, but patient would like to go home with home health which has been arranged at time of discharge    AKI superimposed on CKD 3B; metabolic acidosis  -Appears to be clinically dehydrated, baseline creatinine around 2, it is 3.05 on admission, treated with IV fluids, including bicarb drip, volume status has improved, renal function has improved as well, good appetite, good oral intake, and he is resumed back on his home bicarb.     Hypertensive urgency  - Continue Norvasc , use IV labetalol  as needed   - Resumed home Norvasc , as well he was started on low-dose Coreg given uncontrolled blood pressure .   Diabetes mellitus, type II, severely uncontrolled with hyperglycemia -Glucose significantly elevated 488 on presentation. - A1c is 11.3. - Severely uncontrolled, continue with home insulin  -Does not appear to be in DKA - CBG better controlled during hospital stay on lower dose insulin , so possibly dietary noncompliance with his medication noncompliance contributing to hyperglycemia on presentation, and hyperglycemia at home likely with his A1c of 11.3, so he will be discharged on lower dose insulin  will decrease home dose from 30-20, and  will arrange for visiting RN to ensure compliant with medication and diet.    Discharge Diagnoses:  Principal Problem:   Episode of shaking Active Problems:   Acute renal failure superimposed on stage 3b chronic kidney disease  (HCC)   Type 2 diabetes mellitus (HCC)   Hypertensive urgency   Metabolic acidosis    Discharge Instructions  Discharge Instructions     Diet - low sodium heart healthy   Complete by: As directed    Discharge instructions   Complete by: As directed    Follow with Primary MD Aldo Hun, MD in 7 days   Get CBC, CMP, checked  by Primary MD next visit.    Activity: As tolerated with Full fall precautions use walker/cane & assistance as needed   Disposition Home    Diet: Heart Healthy /carb modified  On your next visit with your primary care physician please Get Medicines reviewed and adjusted.   Please request your Prim.MD to go over all Hospital Tests and Procedure/Radiological results at the follow up, please get all Hospital records sent to your Prim MD by signing hospital release before you go home.   If you experience worsening of your admission symptoms, develop shortness of breath, life threatening emergency, suicidal or homicidal thoughts you must seek medical attention immediately by calling 911 or calling your MD immediately  if symptoms less severe.  You Must read complete instructions/literature along with all the possible adverse reactions/side effects for all the Medicines you take and that have been prescribed to you. Take any new Medicines after you have completely understood and accpet all the possible adverse reactions/side effects.   Do not drive, operating heavy machinery, perform activities at heights, swimming or participation in water  activities or provide baby sitting services if your were admitted for syncope or siezures until you have seen by Primary MD or a Neurologist and advised to do so again.  Do not drive when taking Pain medications.    Do not take more than prescribed Pain, Sleep and Anxiety Medications  Special Instructions: If you have smoked or chewed Tobacco  in the last 2 yrs please stop smoking, stop any regular Alcohol  and or any  Recreational drug use.  Wear Seat belts while driving.   Please note  You were cared for by a hospitalist during your hospital stay. If you have any questions about your discharge medications or the care you received while you were in the hospital after you are discharged, you can call the unit and asked to speak with the hospitalist on call if the hospitalist that took care of you is not available. Once you are discharged, your primary care physician will handle any further medical issues. Please note that NO REFILLS for any discharge medications will be authorized once you are discharged, as it is imperative that you return to your primary care physician (or establish a relationship with a primary care physician if you do not have one) for your aftercare needs so that they can reassess your need for medications and monitor your lab values.   Increase activity slowly   Complete by: As directed       Allergies as of 03/28/2024       Reactions   Donepezil    Other Reaction(s): bad dreams, diarrhea   Fosinopril Cough   Influenza Virus Vaccine    Other Reaction(s): he said it makes him sick. won't take anymore.   Oxycodone     Other Reaction(s): took two after  back surgery and it put him in another world   Rosuvastatin     Other Reaction(s): muscle cramps   Tadalafil    Other Reaction(s): "head blown off"   Valsartan    Other Reaction(s): stopped 10/18 by dr. Arlis [facility] in creatinine.   Fenofibrate Rash        Medication List     TAKE these medications    allopurinol  100 MG tablet Commonly known as: ZYLOPRIM  Take 100 mg by mouth daily.   amLODipine  10 MG tablet Commonly known as: NORVASC  Take 10 mg by mouth daily.   aspirin  81 MG tablet Take 81 mg by mouth daily.   carvedilol 3.125 MG tablet Commonly known as: COREG Take 1 tablet (3.125 mg total) by mouth 2 (two) times daily with a meal.   cyanocobalamin  1000 MCG tablet Take 1 tablet (1,000 mcg total) by mouth  daily.   esomeprazole  20 MG capsule Commonly known as: NexIUM  Take 1 capsule (20 mg total) by mouth daily.   Fe Fum-Vit C-Vit B12-FA Caps capsule Commonly known as: TRIGELS-F FORTE Take 1 capsule by mouth 2 (two) times daily.   insulin  lispro 100 UNIT/ML KwikPen Commonly known as: HumaLOG  KwikPen Inject 4 Units into the skin 3 (three) times daily. What changed:  how much to take when to take this   minocycline  100 MG capsule Commonly known as: MINOCIN  Take 200 mg by mouth 2 (two) times daily.   sodium bicarbonate  650 MG tablet Take 650 mg by mouth daily.   tamsulosin 0.4 MG Caps capsule Commonly known as: FLOMAX Take 1 capsule (0.4 mg total) by mouth daily after supper.   Tresiba FlexTouch 100 UNIT/ML FlexTouch Pen Generic drug: insulin  degludec Inject 20 Units into the skin daily. What changed: how much to take        Follow-up Information     [facility] Follow up.   Specialty: Home Health Services Why: Gasper Karst will provide Home health and will contact you to arrange a home visit Contact information: [address] [place] [address] [phone]                Allergies  Allergen Reactions   Donepezil     Other Reaction(s): bad dreams, diarrhea   Fosinopril Cough   Influenza Virus Vaccine     Other Reaction(s): he said it makes him sick. won't take anymore.   Oxycodone      Other Reaction(s): took two after back surgery and it put him in another world   Rosuvastatin      Other Reaction(s): muscle cramps   Tadalafil     Other Reaction(s): "head blown off"   Valsartan     Other Reaction(s): stopped 10/18 by dr. Arlis [facility] in creatinine.   Fenofibrate Rash    Consultations: none   Procedures/Studies: DG Abd Portable 1V Result Date: 03/28/2024 CLINICAL DATA:  Abdominal pain EXAM: PORTABLE ABDOMEN - 1 VIEW COMPARISON:  None Available. FINDINGS: The bowel gas pattern is normal. No radio-opaque calculi or other  significant radiographic abnormality are seen. Postsurgical changes of the lower lumbosacral spine no significant change since prior examination CT abdomen and pelvis August 08, 2023 IMPRESSION: Negative. Electronically Signed   By: Fredrich Jefferson M.D.   On: 03/28/2024 09:26   MR BRAIN WO CONTRAST Result Date: 03/27/2024 CLINICAL DATA:  CT findings concerning for hypodensity in the right inferior frontal region, artifact versus edema. EXAM: MRI HEAD WITHOUT CONTRAST TECHNIQUE: Multiplanar, multiecho pulse sequences of the  brain and surrounding structures were obtained without intravenous contrast. COMPARISON:  CT head 03/24/2024. FINDINGS: Brain: No acute infarct. No evidence of intracranial hemorrhage. T2/FLAIR hyperintensity in the periventricular and subcortical white matter. Generalized parenchymal volume loss. No edema, mass effect, or midline shift. Posterior fossa is unremarkable. Normal appearance of midline structures. The basilar cisterns are patent. No extra-axial fluid collections. Ventricles: Prominence of the lateral ventricles suggestive of underlying parenchymal volume loss. Vascular: Skull base flow voids are visualized. Skull and upper cervical spine: No focal abnormality. Sinuses/Orbits: Bilateral lens replacement. Orbits are symmetric. Mild mucosal thickening in the ethmoid and maxillary sinuses. Focal mucosal thickening in the left pterygoid recess. Other: Mastoid air cells are clear. IMPRESSION: No acute intracranial abnormality. No focal abnormality or evidence of edema in the inferior right frontal lobe. Moderate chronic microvascular ischemic changes and parenchymal volume loss. Electronically Signed   By: Denny Flack M.D.   On: 03/27/2024 15:14   EEG adult Result Date: 03/25/2024 Arleene Lack, MD     03/25/2024  3:47 PM Patient Name: Keith Drake MRN: [id number] Epilepsy Attending: Arleene Lack Referring Physician/Provider: Epifanio Haste, MD Date: 03/25/2024 Duration:  29.02 mins Patient history: 89yo M with seizure like episode. EEG to evaluate for seizure Level of alertness: Awake AEDs during EEG study: None Technical aspects: This EEG study was done with scalp electrodes positioned according to the 10-20 International system of electrode placement. Electrical activity was reviewed with band pass filter of 1-70Hz , sensitivity of 7 uV/mm, display speed of 73mm/sec with a 60Hz  notched filter applied as appropriate. EEG data were recorded continuously and digitally stored.  Video monitoring was available and reviewed as appropriate. Description: The posterior dominant rhythm consists of 8 Hz activity of moderate voltage (25-35 uV) seen predominantly in posterior head regions, symmetric and reactive to eye opening and eye closing. Hyperventilation and photic stimulation were not performed.   IMPRESSION: This study is within normal limits. No seizures or epileptiform discharges were seen throughout the recording. A normal interictal EEG does not exclude the diagnosis of epilepsy. Priyanka Suzanne Erps   US  RENAL Result Date: 03/25/2024 EXAM: RETROPERITONEAL ULTRASOUND OF THE KIDNEYS AND URINARY BLADDER TECHNIQUE: Real-time ultrasonography of the retroperitoneum including the kidneys and urinary bladder was performed. COMPARISON: CT abdomen / pelvis dated 08/08/2023 CLINICAL HISTORY: Acute renal failure superimposed on stage 3b chronic kidney disease (HCC). FINDINGS: RIGHT KIDNEY: The right kidney measures 9.7 x 5.2 x 4.9 cm (calculated volume 129 ml). The right kidney demonstrates normal cortical echogenicity. No hydronephrosis or intrarenal stones. LEFT KIDNEY: The left kidney measures 9.5 x 5.5 x 5.3 cm (calculated volume 144 ml). The left kidney demonstrates normal cortical echogenicity. No hydronephrosis or intrarenal stones. BLADDER: Unremarkable appearance of the bladder. IMPRESSION: 1. No acute findings. Electronically signed by: Zadie Herter MD 03/25/2024 12:31 AM EDT RP  Workstation: [id number]   CT Head Wo Contrast Result Date: 03/24/2024 CLINICAL DATA:  Seizure EXAM: CT HEAD WITHOUT CONTRAST TECHNIQUE: Contiguous axial images were obtained from the base of the skull through the vertex without intravenous contrast. RADIATION DOSE REDUCTION: This exam was performed according to the departmental dose-optimization program which includes automated exposure control, adjustment of the mA and/or kV according to patient size and/or use of iterative reconstruction technique. COMPARISON:  Head CT 03/10/2021 FINDINGS: Brain: No hemorrhage or intracranial mass. Atrophy and chronic small vessel ischemic changes of the white matter. Hypodensity within the right inferior frontal region, series 3 image 16 and 17 and coronal series 5, image  52. Nonenlarged ventricles Vascular: No hyperdense vessels.  Carotid vascular calcification Skull: Normal. Negative for fracture or focal lesion. Sinuses/Orbits: Mucosal thickening in the sinuses. Other: None IMPRESSION: 1. Negative for acute intracranial hemorrhage or mass. 2. Atrophy and chronic small vessel ischemic changes of the white matter. Hypodensity within the right inferior frontal region, suspect may be related to artifact, though subtle area of edema is difficult to exclude on this exam. Electronically Signed   By: Esmeralda Hedge M.D.   On: 03/24/2024 18:52   DG Chest Portable 1 View Result Date: 03/24/2024 CLINICAL DATA:  Seizure. EXAM: PORTABLE CHEST 1 VIEW COMPARISON:  08/08/2023. FINDINGS: Low lung volumes. Cardiomediastinal silhouette is otherwise within normal limits. Stable left chest wall pacemaker. No focal consolidation, sizeable pleural effusion, or pneumothorax. No acute osseous abnormality. IMPRESSION: No acute cardiopulmonary findings. Electronically Signed   By: Mannie Seek M.D.   On: 03/24/2024 18:01      Subjective:  He denies any complaints today, eager to go home. Discharge Exam: Vitals:   03/28/24 0743  03/28/24 1121  BP: (!) 146/82 (!) 142/80  Pulse: 70 75  Resp: 14 20  Temp: 97.9 F (36.6 C) 98.2 F (36.8 C)  SpO2: 100%    Vitals:   03/28/24 0000 03/28/24 0424 03/28/24 0743 03/28/24 1121  BP: (!) 149/76 134/78 (!) 146/82 (!) 142/80  Pulse: 73 70 70 75  Resp: 20 20 14 20   Temp: 98.8 F (37.1 C) 98 F (36.7 C) 97.9 F (36.6 C) 98.2 F (36.8 C)  TempSrc: Oral Oral Oral Oral  SpO2: 97% 99% 100%   Weight:      Height:        General: Pt is alert, awake, not in acute distress Cardiovascular: RRR, S1/S2 +, no rubs, no gallops Respiratory: CTA bilaterally, no wheezing, no rhonchi Abdominal: Soft, NT, ND, bowel sounds + Extremities: no edema, no cyanosis    The results of significant diagnostics from this hospitalization (including imaging, microbiology, ancillary and laboratory) are listed below for reference.     Microbiology: No results found for this or any previous visit (from the past 240 hours).   Labs: BNP (last 3 results) No results for input(s): "BNP" in the last 8760 hours. Basic Metabolic Panel: Recent Labs  Lab 03/24/24 1548 03/24/24 1612 03/25/24 0747 03/26/24 0243 03/27/24 0406 03/28/24 0814  NA 137 138 141 136 133* 136  K 5.1 5.0 4.2 4.4 4.7 5.0  CL 107  --  111 108 106 109  CO2 16*  --  20* 19* 21* 16*  GLUCOSE 488*  --  83 152* 111* 139*  BUN 44*  --  37* 35* 38* 44*  CREATININE 3.05*  --  2.38* 2.33* 2.93* 2.70*  CALCIUM  10.0  --  9.8 9.4 9.4 10.0  MG 2.3  --  2.1  --   --  2.1  PHOS  --   --  3.4  --   --  3.8   Liver Function Tests: Recent Labs  Lab 03/24/24 1548  AST 17  ALT 13  ALKPHOS 88  BILITOT 0.6  PROT 6.4*  ALBUMIN 3.4*   Recent Labs  Lab 03/24/24 1548  LIPASE 65*   No results for input(s): "AMMONIA" in the last 168 hours. CBC: Recent Labs  Lab 03/24/24 1548 03/24/24 1612 03/25/24 0747 03/26/24 0243 03/27/24 0406 03/28/24 0814  WBC 10.0  --  8.8 8.6 8.4 8.9  NEUTROABS 5.0  --   --   --   --   --  HGB  12.7* 12.6* 11.6* 10.6* 11.1* 11.9*  HCT 39.1 37.0* 35.9* 33.5* 34.2* 37.0*  MCV 86.1  --  84.5 85.5 85.3 85.8  PLT 231  --  185 174 188 206   Cardiac Enzymes: Recent Labs  Lab 03/24/24 1548  CKTOTAL 37*   BNP: Invalid input(s): "POCBNP" CBG: Recent Labs  Lab 03/27/24 1251 03/27/24 1538 03/27/24 2014 03/28/24 0743 03/28/24 1120  GLUCAP 184* 158* 215* 147* 190*   D-Dimer No results for input(s): "DDIMER" in the last 72 hours. Hgb A1c No results for input(s): "HGBA1C" in the last 72 hours. Lipid Profile No results for input(s): "CHOL", "HDL", "LDLCALC", "TRIG", "CHOLHDL", "LDLDIRECT" in the last 72 hours. Thyroid function studies No results for input(s): "TSH", "T4TOTAL", "T3FREE", "THYROIDAB" in the last 72 hours.  Invalid input(s): "FREET3" Anemia work up No results for input(s): "VITAMINB12", "FOLATE", "FERRITIN", "TIBC", "IRON", "RETICCTPCT" in the last 72 hours. Urinalysis    Component Value Date/Time   COLORURINE STRAW (A) 03/24/2024 1548   APPEARANCEUR CLEAR 03/24/2024 1548   LABSPEC 1.015 03/24/2024 1548   PHURINE 5.0 03/24/2024 1548   GLUCOSEU >=500 (A) 03/24/2024 1548   HGBUR NEGATIVE 03/24/2024 1548   BILIRUBINUR NEGATIVE 03/24/2024 1548   KETONESUR NEGATIVE 03/24/2024 1548   PROTEINUR 100 (A) 03/24/2024 1548   NITRITE NEGATIVE 03/24/2024 1548   LEUKOCYTESUR NEGATIVE 03/24/2024 1548   Sepsis Labs Recent Labs  Lab 03/25/24 0747 03/26/24 0243 03/27/24 0406 03/28/24 0814  WBC 8.8 8.6 8.4 8.9   Microbiology No results found for this or any previous visit (from the past 240 hours).   Time coordinating discharge: Over 30 minutes  SIGNED:   Seena Dadds, MD  [facility] 03/28/2024, 3:32 PM Pager   If 7PM-7AM, please contact night-coverage [email or web address]

## 2024-03-28 NOTE — TOC Transition Note (Addendum)
 Transition of Care [facility]) - Discharge Note   Patient Details  Name: Keith Drake MRN: [id number] Date of Birth: September 04, 1935  [facility] CM/SW Contact:  Eusebio High, RN Phone Number: 03/28/2024, 11:51 AM   Clinical Narrative:     Patient will DC to home today per Son and Patient. They are not interested in a SNF.There are no recommendations for DME- RNCM has left VM with Son to confirm there is no need.  Tried calling 2X     [facility] will provide SN for med management and DM education; PT, OT and an AIDE.   Family will provide transport to home         Barriers to Discharge: Continued Medical Work up, English as a second language teacher, SNF Pending bed offer   Patient Goals and CMS Choice Patient states their goals for this hospitalization and ongoing recovery are:: Rehab CMS Medicare.gov Compare Post Acute Care list provided to:: Patient Represenative (must comment) Choice offered to / list presented to : Adult Children [place] ownership interest in [facility].provided to:: Adult Children    Discharge Placement                       Discharge Plan and Services Additional resources added to the After Visit Summary for   In-house Referral: Clinical Social Work   Post Acute Care Choice: Skilled Nursing Facility                               Social Drivers of Health (SDOH) Interventions SDOH Screenings   Food Insecurity: No Food Insecurity (03/25/2024)  Housing: Unknown (03/25/2024)  Transportation Needs: No Transportation Needs (03/25/2024)  Utilities: Not At Risk (03/25/2024)  Social Connections: Socially Isolated (03/25/2024)  Tobacco Use: Medium Risk (03/24/2024)     Readmission Risk Interventions    08/10/2023   12:29 PM  Readmission Risk Prevention Plan  Transportation Screening Complete  PCP or Specialist Appt within 5-7 Days Complete  Home Care Screening Complete  Medication Review (RN CM) Complete

## 2024-03-28 NOTE — Discharge Instructions (Signed)
 Follow with Primary MD Aldo Hun, MD in 7 days   Get CBC, CMP, checked  by Primary MD next visit.    Activity: As tolerated with Full fall precautions use walker/cane & assistance as needed   Disposition Home    Diet: Heart Healthy /carb modified  On your next visit with your primary care physician please Get Medicines reviewed and adjusted.   Please request your Prim.MD to go over all Hospital Tests and Procedure/Radiological results at the follow up, please get all Hospital records sent to your Prim MD by signing hospital release before you go home.   If you experience worsening of your admission symptoms, develop shortness of breath, life threatening emergency, suicidal or homicidal thoughts you must seek medical attention immediately by calling 911 or calling your MD immediately  if symptoms less severe.  You Must read complete instructions/literature along with all the possible adverse reactions/side effects for all the Medicines you take and that have been prescribed to you. Take any new Medicines after you have completely understood and accpet all the possible adverse reactions/side effects.   Do not drive, operating heavy machinery, perform activities at heights, swimming or participation in water  activities or provide baby sitting services if your were admitted for syncope or siezures until you have seen by Primary MD or a Neurologist and advised to do so again.  Do not drive when taking Pain medications.    Do not take more than prescribed Pain, Sleep and Anxiety Medications  Special Instructions: If you have smoked or chewed Tobacco  in the last 2 yrs please stop smoking, stop any regular Alcohol  and or any Recreational drug use.  Wear Seat belts while driving.   Please note  You were cared for by a hospitalist during your hospital stay. If you have any questions about your discharge medications or the care you received while you were in the hospital after you are  discharged, you can call the unit and asked to speak with the hospitalist on call if the hospitalist that took care of you is not available. Once you are discharged, your primary care physician will handle any further medical issues. Please note that NO REFILLS for any discharge medications will be authorized once you are discharged, as it is imperative that you return to your primary care physician (or establish a relationship with a primary care physician if you do not have one) for your aftercare needs so that they can reassess your need for medications and monitor your lab values.

## 2024-03-28 NOTE — Care Management Important Message (Signed)
 Important Message  Patient Details  Name: Emaad Nanna MRN: [id number] Date of Birth: 17-Sep-1935   Important Message Given:  Yes - Medicare IM     Wynonia Hedges 03/28/2024, 3:14 PM

## 2024-04-20 ENCOUNTER — Ambulatory Visit (INDEPENDENT_AMBULATORY_CARE_PROVIDER_SITE_OTHER): Payer: Medicare Other

## 2024-04-20 DIAGNOSIS — I442 Atrioventricular block, complete: Secondary | ICD-10-CM

## 2024-04-20 LAB — CUP PACEART REMOTE DEVICE CHECK
Battery Remaining Longevity: 36 mo
Battery Remaining Percentage: 36 %
Battery Voltage: 2.98 V
Brady Statistic AP VP Percent: 92 %
Brady Statistic AP VS Percent: 1 %
Brady Statistic AS VP Percent: 8.2 %
Brady Statistic AS VS Percent: 1 %
Brady Statistic RA Percent Paced: 91 %
Brady Statistic RV Percent Paced: 99 %
Date Time Interrogation Session: 20250626020015
Implantable Lead Connection Status: 753985
Implantable Lead Connection Status: 753985
Implantable Lead Implant Date: 20190925
Implantable Lead Implant Date: 20190925
Implantable Lead Location: 753859
Implantable Lead Location: 753860
Implantable Pulse Generator Implant Date: 20190925
Lead Channel Impedance Value: 380 Ohm
Lead Channel Impedance Value: 410 Ohm
Lead Channel Pacing Threshold Amplitude: 0.625 V
Lead Channel Pacing Threshold Amplitude: 0.875 V
Lead Channel Pacing Threshold Pulse Width: 0.5 ms
Lead Channel Pacing Threshold Pulse Width: 0.5 ms
Lead Channel Sensing Intrinsic Amplitude: 4.5 mV
Lead Channel Sensing Intrinsic Amplitude: 4.6 mV
Lead Channel Setting Pacing Amplitude: 1.125
Lead Channel Setting Pacing Amplitude: 1.625
Lead Channel Setting Pacing Pulse Width: 0.5 ms
Lead Channel Setting Sensing Sensitivity: 4 mV
Pulse Gen Model: 2272
Pulse Gen Serial Number: 9066127

## 2024-04-24 ENCOUNTER — Ambulatory Visit: Payer: Self-pay | Admitting: Cardiovascular Disease

## 2024-07-18 ENCOUNTER — Other Ambulatory Visit (HOSPITAL_COMMUNITY): Payer: Self-pay | Admitting: Neurosurgery

## 2024-07-18 DIAGNOSIS — M4316 Spondylolisthesis, lumbar region: Secondary | ICD-10-CM

## 2024-07-20 ENCOUNTER — Ambulatory Visit: Payer: Medicare Other

## 2024-07-20 DIAGNOSIS — I442 Atrioventricular block, complete: Secondary | ICD-10-CM

## 2024-07-20 NOTE — Progress Notes (Signed)
 Remote pacemaker transmission.

## 2024-07-21 LAB — CUP PACEART REMOTE DEVICE CHECK
Battery Remaining Longevity: 32 mo
Battery Remaining Percentage: 33 %
Battery Voltage: 2.96 V
Brady Statistic AP VP Percent: 92 %
Brady Statistic AP VS Percent: 1 %
Brady Statistic AS VP Percent: 7.9 %
Brady Statistic AS VS Percent: 1 %
Brady Statistic RA Percent Paced: 92 %
Brady Statistic RV Percent Paced: 99 %
Date Time Interrogation Session: 20250925020022
Implantable Lead Connection Status: 753985
Implantable Lead Connection Status: 753985
Implantable Lead Implant Date: 20190925
Implantable Lead Implant Date: 20190925
Implantable Lead Location: 753859
Implantable Lead Location: 753860
Implantable Pulse Generator Implant Date: 20190925
Lead Channel Impedance Value: 380 Ohm
Lead Channel Impedance Value: 430 Ohm
Lead Channel Pacing Threshold Amplitude: 0.625 V
Lead Channel Pacing Threshold Amplitude: 1 V
Lead Channel Pacing Threshold Pulse Width: 0.5 ms
Lead Channel Pacing Threshold Pulse Width: 0.5 ms
Lead Channel Sensing Intrinsic Amplitude: 3.8 mV
Lead Channel Sensing Intrinsic Amplitude: 4.6 mV
Lead Channel Setting Pacing Amplitude: 1.25 V
Lead Channel Setting Pacing Amplitude: 1.625
Lead Channel Setting Pacing Pulse Width: 0.5 ms
Lead Channel Setting Sensing Sensitivity: 4 mV
Pulse Gen Model: 2272
Pulse Gen Serial Number: 9066127

## 2024-07-22 ENCOUNTER — Ambulatory Visit: Payer: Self-pay | Admitting: Cardiovascular Disease

## 2024-07-24 NOTE — Progress Notes (Signed)
 Remote PPM Transmission

## 2024-08-03 LAB — LAB REPORT - SCANNED
Albumin, Urine POC: 1063.3
Albumin/Creatinine Ratio, Urine, POC: 2085
Creatinine, POC: 51 mg/dL
EGFR: 21
PTH: 317

## 2024-10-19 ENCOUNTER — Ambulatory Visit

## 2024-10-19 DIAGNOSIS — I442 Atrioventricular block, complete: Secondary | ICD-10-CM | POA: Diagnosis not present

## 2024-10-20 LAB — CUP PACEART REMOTE DEVICE CHECK
Battery Remaining Longevity: 30 mo
Battery Remaining Percentage: 30 %
Battery Voltage: 2.95 V
Brady Statistic AP VP Percent: 92 %
Brady Statistic AP VS Percent: 1 %
Brady Statistic AS VP Percent: 7.7 %
Brady Statistic AS VS Percent: 1 %
Brady Statistic RA Percent Paced: 92 %
Brady Statistic RV Percent Paced: 99 %
Date Time Interrogation Session: 20251225020014
Implantable Lead Connection Status: 753985
Implantable Lead Connection Status: 753985
Implantable Lead Implant Date: 20190925
Implantable Lead Implant Date: 20190925
Implantable Lead Location: 753859
Implantable Lead Location: 753860
Implantable Pulse Generator Implant Date: 20190925
Lead Channel Impedance Value: 380 Ohm
Lead Channel Impedance Value: 440 Ohm
Lead Channel Pacing Threshold Amplitude: 0.625 V
Lead Channel Pacing Threshold Amplitude: 1 V
Lead Channel Pacing Threshold Pulse Width: 0.5 ms
Lead Channel Pacing Threshold Pulse Width: 0.5 ms
Lead Channel Sensing Intrinsic Amplitude: 12 mV
Lead Channel Sensing Intrinsic Amplitude: 3.8 mV
Lead Channel Setting Pacing Amplitude: 1.25 V
Lead Channel Setting Pacing Amplitude: 1.625
Lead Channel Setting Pacing Pulse Width: 0.5 ms
Lead Channel Setting Sensing Sensitivity: 4 mV
Pulse Gen Model: 2272
Pulse Gen Serial Number: 9066127

## 2024-10-20 NOTE — Progress Notes (Signed)
 Remote PPM Transmission

## 2024-10-21 ENCOUNTER — Ambulatory Visit: Payer: Self-pay | Admitting: Cardiovascular Disease

## 2024-10-27 ENCOUNTER — Other Ambulatory Visit: Payer: Self-pay

## 2024-10-27 ENCOUNTER — Inpatient Hospital Stay (HOSPITAL_BASED_OUTPATIENT_CLINIC_OR_DEPARTMENT_OTHER)
Admission: EM | Admit: 2024-10-27 | Discharge: 2024-10-30 | DRG: 438 | Disposition: A | Discharge status: Home / Self Care | Attending: Family Medicine | Admitting: Family Medicine

## 2024-10-27 ENCOUNTER — Emergency Department (HOSPITAL_BASED_OUTPATIENT_CLINIC_OR_DEPARTMENT_OTHER)

## 2024-10-27 DIAGNOSIS — I4510 Unspecified right bundle-branch block: Secondary | ICD-10-CM | POA: Diagnosis present

## 2024-10-27 DIAGNOSIS — Z981 Arthrodesis status: Secondary | ICD-10-CM

## 2024-10-27 DIAGNOSIS — E1122 Type 2 diabetes mellitus with diabetic chronic kidney disease: Secondary | ICD-10-CM | POA: Diagnosis present

## 2024-10-27 DIAGNOSIS — D631 Anemia in chronic kidney disease: Secondary | ICD-10-CM | POA: Diagnosis present

## 2024-10-27 DIAGNOSIS — H9190 Unspecified hearing loss, unspecified ear: Secondary | ICD-10-CM | POA: Diagnosis present

## 2024-10-27 DIAGNOSIS — Z7982 Long term (current) use of aspirin: Secondary | ICD-10-CM

## 2024-10-27 DIAGNOSIS — Z79899 Other long term (current) drug therapy: Secondary | ICD-10-CM

## 2024-10-27 DIAGNOSIS — R001 Bradycardia, unspecified: Secondary | ICD-10-CM | POA: Diagnosis present

## 2024-10-27 DIAGNOSIS — Z800 Family history of malignant neoplasm of digestive organs: Secondary | ICD-10-CM

## 2024-10-27 DIAGNOSIS — D509 Iron deficiency anemia, unspecified: Secondary | ICD-10-CM | POA: Diagnosis present

## 2024-10-27 DIAGNOSIS — R739 Hyperglycemia, unspecified: Secondary | ICD-10-CM

## 2024-10-27 DIAGNOSIS — N400 Enlarged prostate without lower urinary tract symptoms: Secondary | ICD-10-CM | POA: Diagnosis present

## 2024-10-27 DIAGNOSIS — K859 Acute pancreatitis without necrosis or infection, unspecified: Principal | ICD-10-CM | POA: Diagnosis present

## 2024-10-27 DIAGNOSIS — Z888 Allergy status to other drugs, medicaments and biological substances status: Secondary | ICD-10-CM

## 2024-10-27 DIAGNOSIS — Z8546 Personal history of malignant neoplasm of prostate: Secondary | ICD-10-CM

## 2024-10-27 DIAGNOSIS — E872 Acidosis, unspecified: Secondary | ICD-10-CM | POA: Diagnosis present

## 2024-10-27 DIAGNOSIS — I129 Hypertensive chronic kidney disease with stage 1 through stage 4 chronic kidney disease, or unspecified chronic kidney disease: Secondary | ICD-10-CM | POA: Diagnosis present

## 2024-10-27 DIAGNOSIS — L719 Rosacea, unspecified: Secondary | ICD-10-CM | POA: Diagnosis present

## 2024-10-27 DIAGNOSIS — Z887 Allergy status to serum and vaccine status: Secondary | ICD-10-CM

## 2024-10-27 DIAGNOSIS — Z885 Allergy status to narcotic agent status: Secondary | ICD-10-CM

## 2024-10-27 DIAGNOSIS — Z833 Family history of diabetes mellitus: Secondary | ICD-10-CM

## 2024-10-27 DIAGNOSIS — E785 Hyperlipidemia, unspecified: Secondary | ICD-10-CM | POA: Diagnosis present

## 2024-10-27 DIAGNOSIS — M109 Gout, unspecified: Secondary | ICD-10-CM | POA: Diagnosis present

## 2024-10-27 DIAGNOSIS — N1832 Chronic kidney disease, stage 3b: Secondary | ICD-10-CM | POA: Diagnosis present

## 2024-10-27 DIAGNOSIS — Z8551 Personal history of malignant neoplasm of bladder: Secondary | ICD-10-CM

## 2024-10-27 DIAGNOSIS — E1100 Type 2 diabetes mellitus with hyperosmolarity without nonketotic hyperglycemic-hyperosmolar coma (NKHHC): Secondary | ICD-10-CM | POA: Diagnosis present

## 2024-10-27 DIAGNOSIS — Z87891 Personal history of nicotine dependence: Secondary | ICD-10-CM

## 2024-10-27 DIAGNOSIS — Z794 Long term (current) use of insulin: Secondary | ICD-10-CM

## 2024-10-27 DIAGNOSIS — K858 Other acute pancreatitis without necrosis or infection: Principal | ICD-10-CM | POA: Diagnosis present

## 2024-10-27 DIAGNOSIS — N179 Acute kidney failure, unspecified: Secondary | ICD-10-CM | POA: Diagnosis present

## 2024-10-27 DIAGNOSIS — Z950 Presence of cardiac pacemaker: Secondary | ICD-10-CM

## 2024-10-27 LAB — BASIC METABOLIC PANEL WITH GFR
Anion gap: 13 (ref 5–15)
BUN: 38 mg/dL — ABNORMAL HIGH (ref 8–23)
CO2: 17 mmol/L — ABNORMAL LOW (ref 22–32)
Calcium: 9.1 mg/dL (ref 8.9–10.3)
Chloride: 109 mmol/L (ref 98–111)
Creatinine, Ser: 2.94 mg/dL — ABNORMAL HIGH (ref 0.61–1.24)
GFR, Estimated: 20 mL/min — ABNORMAL LOW
Glucose, Bld: 221 mg/dL — ABNORMAL HIGH (ref 70–99)
Potassium: 4.3 mmol/L (ref 3.5–5.1)
Sodium: 138 mmol/L (ref 135–145)

## 2024-10-27 LAB — COMPREHENSIVE METABOLIC PANEL WITH GFR
ALT: 20 U/L (ref 0–44)
AST: 18 U/L (ref 15–41)
Albumin: 3.9 g/dL (ref 3.5–5.0)
Alkaline Phosphatase: 110 U/L (ref 38–126)
Anion gap: 17 — ABNORMAL HIGH (ref 5–15)
BUN: 40 mg/dL — ABNORMAL HIGH (ref 8–23)
CO2: 16 mmol/L — ABNORMAL LOW (ref 22–32)
Calcium: 9.6 mg/dL (ref 8.9–10.3)
Chloride: 102 mmol/L (ref 98–111)
Creatinine, Ser: 3.1 mg/dL — ABNORMAL HIGH (ref 0.61–1.24)
GFR, Estimated: 19 mL/min — ABNORMAL LOW
Glucose, Bld: 390 mg/dL — ABNORMAL HIGH (ref 70–99)
Potassium: 4.5 mmol/L (ref 3.5–5.1)
Sodium: 135 mmol/L (ref 135–145)
Total Bilirubin: 0.3 mg/dL (ref 0.0–1.2)
Total Protein: 6.6 g/dL (ref 6.5–8.1)

## 2024-10-27 LAB — URINALYSIS, ROUTINE W REFLEX MICROSCOPIC
Bilirubin Urine: NEGATIVE
Glucose, UA: >=500 mg/dL — AB
Ketones, ur: NEGATIVE mg/dL
Leukocytes,Ua: NEGATIVE
Nitrite: NEGATIVE
Protein, ur: 100 mg/dL — AB
Specific Gravity, Urine: 1.02 (ref 1.005–1.030)
pH: 6 (ref 5.0–8.0)

## 2024-10-27 LAB — CBC
HCT: 38.9 % — ABNORMAL LOW (ref 39.0–52.0)
Hemoglobin: 13.2 g/dL (ref 13.0–17.0)
MCH: 28.2 pg (ref 26.0–34.0)
MCHC: 33.9 g/dL (ref 30.0–36.0)
MCV: 83.1 fL (ref 80.0–100.0)
Platelets: 228 K/uL (ref 150–400)
RBC: 4.68 MIL/uL (ref 4.22–5.81)
RDW: 15.3 % (ref 11.5–15.5)
WBC: 13.2 K/uL — ABNORMAL HIGH (ref 4.0–10.5)
nRBC: 0 % (ref 0.0–0.2)

## 2024-10-27 LAB — CBG MONITORING, ED
Glucose-Capillary: 244 mg/dL — ABNORMAL HIGH (ref 70–99)
Glucose-Capillary: 220 mg/dL — ABNORMAL HIGH (ref 70–99)

## 2024-10-27 LAB — URINALYSIS, MICROSCOPIC (REFLEX)

## 2024-10-27 LAB — LIPASE, BLOOD: Lipase: 255 U/L — ABNORMAL HIGH (ref 11–51)

## 2024-10-27 LAB — TROPONIN T, HIGH SENSITIVITY: Troponin T High Sensitivity: 69 ng/L — ABNORMAL HIGH (ref 0–19)

## 2024-10-27 LAB — BETA-HYDROXYBUTYRIC ACID: Beta-Hydroxybutyric Acid: 0.29 mmol/L — ABNORMAL HIGH (ref 0.05–0.27)

## 2024-10-27 LAB — OSMOLALITY: Osmolality: 314 mosm/kg — ABNORMAL HIGH (ref 275–295)

## 2024-10-27 MED ORDER — INSULIN GLARGINE 100 UNIT/ML ~~LOC~~ SOLN
20.0000 [IU] | Freq: Every day | SUBCUTANEOUS | Status: DC
  Filled 2024-10-27: qty 0.2

## 2024-10-27 MED ORDER — ONDANSETRON HCL 4 MG/2ML IJ SOLN
4.0000 mg | Freq: Four times a day (QID) | INTRAMUSCULAR | Status: DC | PRN
  Administered 2024-10-28: 4 mg via INTRAVENOUS
  Filled 2024-10-27: qty 2

## 2024-10-27 MED ORDER — ACETAMINOPHEN 325 MG PO TABS: 650.0000 mg | ORAL_TABLET | Freq: Four times a day (QID) | ORAL | Status: DC | PRN

## 2024-10-27 MED ORDER — LACTATED RINGERS IV BOLUS
1000.0000 mL | INTRAVENOUS | Status: AC
  Administered 2024-10-27: 1000 mL via INTRAVENOUS

## 2024-10-27 MED ORDER — ONDANSETRON HCL 4 MG/2ML IJ SOLN
4.0000 mg | Freq: Once | INTRAMUSCULAR | Status: AC
  Administered 2024-10-27: 4 mg via INTRAVENOUS
  Filled 2024-10-27: qty 2

## 2024-10-27 MED ORDER — INSULIN ASPART 100 UNIT/ML IJ SOLN
3.0000 [IU] | Freq: Once | INTRAMUSCULAR | Status: AC
  Administered 2024-10-27: 3 [IU] via SUBCUTANEOUS
  Filled 2024-10-27: qty 3

## 2024-10-27 MED ORDER — INSULIN ASPART 100 UNIT/ML IJ SOLN
0.0000 [IU] | INTRAMUSCULAR | Status: DC
  Administered 2024-10-27: 2 [IU] via SUBCUTANEOUS
  Administered 2024-10-28: 1 [IU] via SUBCUTANEOUS
  Filled 2024-10-27: qty 1
  Filled 2024-10-27: qty 2

## 2024-10-27 MED ORDER — ONDANSETRON HCL 4 MG/2ML IJ SOLN
4.0000 mg | Freq: Once | INTRAMUSCULAR | Status: DC
  Filled 2024-10-27: qty 2

## 2024-10-27 MED ORDER — LACTATED RINGERS IV SOLN: INTRAVENOUS | Status: AC

## 2024-10-27 MED ORDER — MORPHINE SULFATE (PF) 4 MG/ML IV SOLN
4.0000 mg | Freq: Once | INTRAVENOUS | Status: AC
  Administered 2024-10-27: 4 mg via INTRAVENOUS
  Filled 2024-10-27: qty 1

## 2024-10-27 MED ORDER — HYDROMORPHONE HCL 1 MG/ML IJ SOLN: 0.5000 mg | INTRAMUSCULAR | Status: DC | PRN

## 2024-10-27 MED ORDER — INSULIN GLARGINE-YFGN 100 UNIT/ML ~~LOC~~ SOLN
20.0000 [IU] | Freq: Every day | SUBCUTANEOUS | Status: DC
  Filled 2024-10-27: qty 0.2

## 2024-10-27 MED ORDER — MORPHINE SULFATE (PF) 4 MG/ML IV SOLN: 4.0000 mg | Freq: Once | INTRAVENOUS | Status: DC

## 2024-10-27 MED ORDER — SODIUM CHLORIDE 0.9 % IV BOLUS
500.0000 mL | Freq: Once | INTRAVENOUS | Status: AC
  Administered 2024-10-27: 500 mL via INTRAVENOUS

## 2024-10-27 NOTE — ED Provider Notes (Signed)
 " [place] [facility] AT [facility] Provider Note   CSN: [id number] Arrival date & time: 10/27/24  1326    Patient presents with: Abdominal Pain   Keith Drake is a 89 y.o. male here for evaluation of abdominal pain.  Pain started about a week ago.  Located to mid abdomen.  Pain does not radiate to his back.  Associated nausea, vomiting.  No diarrhea, melena, bright red blood per rectum.  Also noted to have some increase in his reflux symptoms.  He has a history of pancreatitis.  No chronic NSAID use, EtOH use.  No chest pain, shortness of breath, pain or swelling to lower legs.  Unsure of his last colonoscopy.  States has been many years.   HPI     Prior to Admission medications  Medication Sig Start Date End Date Taking? Authorizing Provider  allopurinol  (ZYLOPRIM ) 100 MG tablet Take 100 mg by mouth daily. 07/07/18   [provider]  amLODipine  (NORVASC ) 10 MG tablet Take 10 mg by mouth daily.  12/09/15   [provider]  aspirin  81 MG tablet Take 81 mg by mouth daily.    [provider]  carvedilol  (COREG ) 3.125 MG tablet Take 1 tablet (3.125 mg total) by mouth 2 (two) times daily with a meal. 03/28/24   Elgergawy, Brayton RAMAN, MD  cyanocobalamin  1000 MCG tablet Take 1 tablet (1,000 mcg total) by mouth daily. 07/21/23   Caleen Qualia, MD  esomeprazole  (NEXIUM ) 20 MG capsule Take 1 capsule (20 mg total) by mouth daily. 07/21/23 07/20/24  Amin, Sumayya, MD  Fe Fum-Vit C-Vit B12-FA (TRIGELS-F FORTE) CAPS capsule Take 1 capsule by mouth 2 (two) times daily. 07/21/23   Amin, Sumayya, MD  insulin  lispro (HUMALOG  KWIKPEN) 100 UNIT/ML KwikPen Inject 4 Units into the skin 3 (three) times daily. Patient taking differently: Inject 8 Units into the skin 2 (two) times daily. 08/11/23   Cindy Garnette POUR, MD  minocycline  (MINOCIN ) 100 MG capsule Take 200 mg by mouth 2 (two) times daily. 03/05/24   [provider]  sodium bicarbonate  650 MG tablet Take  650 mg by mouth daily. 06/03/23   [provider]  tamsulosin  (FLOMAX ) 0.4 MG CAPS capsule Take 1 capsule (0.4 mg total) by mouth daily after supper. 03/28/24   Elgergawy, Brayton RAMAN, MD  TRESIBA  FLEXTOUCH 100 UNIT/ML FlexTouch Pen Inject 20 Units into the skin daily. 03/28/24   Elgergawy, Brayton RAMAN, MD    Allergies: Donepezil, Fosinopril, Influenza virus vaccine, Oxycodone , Rosuvastatin , Tadalafil, Valsartan, and Fenofibrate    Review of Systems  Constitutional: Negative.   HENT: Negative.    Respiratory: Negative.    Cardiovascular: Negative.   Gastrointestinal:  Positive for abdominal pain, nausea and vomiting. Negative for abdominal distention, anal bleeding, blood in stool, constipation and rectal pain.  Genitourinary: Negative.   Musculoskeletal: Negative.   Skin: Negative.   Neurological: Negative.   All other systems reviewed and are negative.   Updated Vital Signs BP (!) 167/105 (BP Location: Right Arm)   Pulse 67   Temp 97.9 F (36.6 C) (Oral)   Resp 18   Ht 5' 10 (1.778 m)   Wt 81.1 kg   SpO2 100%   BMI 25.65 kg/m   Physical Exam Vitals and nursing note reviewed.  Constitutional:      General: He is not in acute distress.    Appearance: He is well-developed. He is not ill-appearing, toxic-appearing or diaphoretic.  HENT:     Head: Normocephalic  and atraumatic.  Eyes:     Pupils: Pupils are equal, round, and reactive to light.  Cardiovascular:     Rate and Rhythm: Normal rate and regular rhythm.     Heart sounds: Normal heart sounds.  Pulmonary:     Effort: Pulmonary effort is normal. No respiratory distress.     Breath sounds: Normal breath sounds.  Abdominal:     General: Bowel sounds are normal. There is no distension.     Palpations: Abdomen is soft.     Tenderness: There is generalized abdominal tenderness and tenderness in the epigastric area and periumbilical area. There is no right CVA tenderness, left CVA tenderness, guarding or rebound. Negative  signs include Murphy's sign and McBurney's sign.  Musculoskeletal:        General: Normal range of motion.     Cervical back: Normal range of motion and neck supple.  Skin:    General: Skin is warm and dry.     Capillary Refill: Capillary refill takes less than 2 seconds.  Neurological:     General: No focal deficit present.     Mental Status: He is alert and oriented to person, place, and time.     Cranial Nerves: No cranial nerve deficit.     Motor: No weakness.     (all labs ordered are listed, but only abnormal results are displayed) Labs Reviewed  LIPASE, BLOOD - Abnormal; Notable for the following components:      Result Value   Lipase 255 (*)    All other components within normal limits  COMPREHENSIVE METABOLIC PANEL WITH GFR - Abnormal; Notable for the following components:   CO2 16 (*)    Glucose, Bld 390 (*)    BUN 40 (*)    Creatinine, Ser 3.10 (*)    GFR, Estimated 19 (*)    Anion gap 17 (*)    All other components within normal limits  CBC - Abnormal; Notable for the following components:   WBC 13.2 (*)    HCT 38.9 (*)    All other components within normal limits  TROPONIN T, HIGH SENSITIVITY - Abnormal; Notable for the following components:   Troponin T High Sensitivity 69 (*)    All other components within normal limits  URINALYSIS, ROUTINE W REFLEX MICROSCOPIC  OSMOLALITY  BETA-HYDROXYBUTYRIC ACID  BASIC METABOLIC PANEL WITH GFR  CBG MONITORING, ED    EKG: None  Radiology: CT ABDOMEN PELVIS WO CONTRAST Result Date: 10/27/2024 CLINICAL DATA:  History of pancreatitis with abdominal pain, nausea, vomiting, and diarrhea EXAM: CT ABDOMEN AND PELVIS WITHOUT CONTRAST TECHNIQUE: Multidetector CT imaging of the abdomen and pelvis was performed following the standard protocol without IV contrast. RADIATION DOSE REDUCTION: This exam was performed according to the departmental dose-optimization program which includes automated exposure control, adjustment of the mA  and/or kV according to patient size and/or use of iterative reconstruction technique. COMPARISON:  CT abdomen and pelvis dated 08/08/2023 FINDINGS: Lower chest: No focal consolidation or pulmonary nodule in the lung bases. No pleural effusion or pneumothorax demonstrated. Partially imaged heart size is normal. Partially imaged cardiac leads, one of which terminates in the right ventricle. Hepatobiliary: Unchanged scattered subcentimeter hypodensities, likely cysts. No intra or extrahepatic biliary ductal dilation. Normal gallbladder. Pancreas: Mild diffuse peripancreatic stranding. No peripancreatic fluid collection. Spleen: Normal in size without focal abnormality. Adrenals/Urinary Tract: No adrenal nodules. Bilateral simple/minimally complicated cysts measuring up to 1.8 cm on the left (2:31). A mildly hyperattenuating 1.1 cm focus in the  lateral lower pole left kidney (2:34) is incompletely characterized. Punctate nonobstructing left lower pole renal stone. No hydronephrosis. No focal bladder wall thickening. Stomach/Bowel: Normal appearance of the stomach. No evidence of bowel wall thickening, distention, or inflammatory changes. Appendix is not discretely seen. Vascular/Lymphatic: Aortic atherosclerosis. New 13 mm right inguinal lymphadenopathy (2:84). Reproductive: Prostate is unremarkable. Other: No free fluid, fluid collection, or free air. Musculoskeletal: No acute or abnormal lytic or blastic osseous lesions. L4-5 spinal fusion hardware appear intact. Multilevel degenerative changes of the partially imaged thoracic and lumbar spine. Bilateral lower anterior abdominal wall subcutaneous soft tissue stranding may be injection related. IMPRESSION: 1. Mild diffuse peripancreatic stranding, consistent with acute interstitial edematous pancreatitis. No peripancreatic fluid collection. 2. New 13 mm right inguinal lymphadenopathy, likely reactive. Recommend correlation with physical examination of the right lower  extremity. 3. A mildly hyperattenuating 1.1 cm focus in the lateral lower pole left kidney may represent a hemorrhagic/proteinaceous cyst, however is incompletely characterized. Recommend further evaluation with nonemergent renal protocol MRI abdomen. 4.  Aortic Atherosclerosis (ICD10-I70.0). Electronically Signed   By: Limin  Xu M.D.   On: 10/27/2024 19:21     Procedures   Medications Ordered in the ED  morphine  (PF) 4 MG/ML injection 4 mg (4 mg Intravenous Patient Refused/Not Given 10/27/24 1902)  ondansetron  (ZOFRAN ) injection 4 mg (has no administration in time range)  insulin  aspart (novoLOG ) injection 3 Units (has no administration in time range)  insulin  glargine (LANTUS ) injection 20 Units (has no administration in time range)  lactated ringers  bolus 1,000 mL (has no administration in time range)  lactated ringers  infusion (has no administration in time range)  insulin  aspart (novoLOG ) injection 0-6 Units (has no administration in time range)  ondansetron  (ZOFRAN ) injection 4 mg (has no administration in time range)  HYDROmorphone  (DILAUDID ) injection 0.5 mg (has no administration in time range)  morphine  (PF) 4 MG/ML injection 4 mg (4 mg Intravenous Given 10/27/24 1720)  ondansetron  (ZOFRAN ) injection 4 mg (4 mg Intravenous Given 10/27/24 1720)  sodium chloride  0.9 % bolus 500 mL (500 mLs Intravenous New Bag/Given 10/27/24 5718)   89 year old here for evaluation abdominal pain, nausea and vomiting.  Symptoms started about a week ago.  Has a history of pancreatitis, states it feels similar.  No chronic NSAID use, EtOH use.  Family states there was never a cause for his pancreatitis.  Pain does not radiate to his back.  No chest pain, shortness of breath.  No pain or swelling to lower legs.  He denies any melena or bright red blood per rectum.  No recent falls or injuries.  He is able to urinate without any difficulty or pain.  His abdomen is soft, diffusely tender worse to mid abdomen.  Will plan  on labs, imaging, reassess.  Labs and imaging personally viewed and interpreted:  CBC leukocytosis 13.2 CMP glucose 390, anion gap 17 Lipase 255 UA Trop 69--- at baseline CT AP shows pancreatitis  Patient reassessed.  CT abdomen pelvis shows pancreatitis.  Patient reassessed.  Initial nausea improved, attempted to tolerate ice chips however severe retching.  Additional antiemetics, pain medicine ordered.  Will plan on admission.  Patient pending beta hydroxy, osmolality.  Initially given IV fluids, small bolus of insulin .   Discussed with Dr. Sundil with medicine who is agreeable to accept patient in transfer for admission.  Medical Decision Making Amount and/or Complexity of Data Reviewed Independent Historian:     Details: Son in room External Data Reviewed: labs, radiology, ECG and notes. Labs: ordered. Decision-making details documented in ED Course. Radiology: ordered and independent interpretation performed. Decision-making details documented in ED Course. ECG/medicine tests: ordered and independent interpretation performed. Decision-making details documented in ED Course.  Risk OTC drugs. Prescription drug management. Parenteral controlled substances. Decision regarding hospitalization. Diagnosis or treatment significantly limited by social determinants of health.       Final diagnoses:  Acute pancreatitis, unspecified complication status, unspecified pancreatitis type  AKI (acute kidney injury)  Hyperglycemia    ED Discharge Orders     None          Alisse Tuite A, PA-C 10/27/24 2036    Ruthe Cornet, DO 10/27/24 2115  "

## 2024-10-27 NOTE — Plan of Care (Signed)
 [facility] to [facility] telemetry bed transfer:  89 year old man with medical history of hypertension, insulin -dependent DM type II, CKD 3A, gout, bladder cancer, heart block status post pacemaker presented emergency department complaining of with complaining of abdominal pain for 1 week with associated nausea vomiting.  Patient denies fever, chill, diarrhea, constipation hematemesis and melena.  At presentation to ED patient found borderline hypertensive otherwise hemodynamically stable.  Afebrile. Lab work, CBC showing leukocytosis 13.2 otherwise unremarkable.    CMP showed low bicarb 16, elevated blood glucose level 390, elevated BUN 40, elevated creatinine 3.10, elevated anion gap 17 and low GFR 19. Pending beta-hydroxybutyrate level and serum osmolarity.  Normal hepatic function panel. Elevated lipase to 55.  CT abdomen pelvis showing acute pancreatitis and left kidney cyst.  1. Mild diffuse peripancreatic stranding, consistent with acute interstitial edematous pancreatitis. No peripancreatic fluid collection. 2. New 13 mm right inguinal lymphadenopathy, likely reactive. Recommend correlation with physical examination of the right lower extremity. 3. A mildly hyperattenuating 1.1 cm focus in the lateral lower pole left kidney may represent a hemorrhagic/proteinaceous cyst, however is incompletely characterized. Recommend further evaluation with nonemergent renal protocol MRI abdomen. 4.  Aortic Atherosclerosis (ICD10-I70.0).  In the ED patient received NovoLog  3 units, 500 mL of NS bolus and morphine .  Hospitalist consulted for further inpatient management of acute pancreatitis, hyperglycemic crisis, high anion gap metabolic acidosis. Started on long and short acting insulin .  Need to check BMP and blood glucose very closely in case it tends to get higher need to start insulin  drip.

## 2024-10-27 NOTE — ED Triage Notes (Signed)
 Patient reports symptoms worsening over the past couple weeks. Lower abdominal pain, N/V/D, and fatigue. Denies fever. Pt has hx pancreatitis and patient states this feels similar.

## 2024-10-28 DIAGNOSIS — H9190 Unspecified hearing loss, unspecified ear: Secondary | ICD-10-CM | POA: Diagnosis present

## 2024-10-28 DIAGNOSIS — D631 Anemia in chronic kidney disease: Secondary | ICD-10-CM | POA: Diagnosis present

## 2024-10-28 DIAGNOSIS — K859 Acute pancreatitis without necrosis or infection, unspecified: Secondary | ICD-10-CM | POA: Diagnosis present

## 2024-10-28 DIAGNOSIS — Z950 Presence of cardiac pacemaker: Secondary | ICD-10-CM | POA: Diagnosis not present

## 2024-10-28 DIAGNOSIS — I4510 Unspecified right bundle-branch block: Secondary | ICD-10-CM | POA: Diagnosis present

## 2024-10-28 DIAGNOSIS — N400 Enlarged prostate without lower urinary tract symptoms: Secondary | ICD-10-CM | POA: Diagnosis present

## 2024-10-28 DIAGNOSIS — E1100 Type 2 diabetes mellitus with hyperosmolarity without nonketotic hyperglycemic-hyperosmolar coma (NKHHC): Secondary | ICD-10-CM | POA: Diagnosis present

## 2024-10-28 DIAGNOSIS — N179 Acute kidney failure, unspecified: Secondary | ICD-10-CM | POA: Diagnosis present

## 2024-10-28 DIAGNOSIS — Z885 Allergy status to narcotic agent status: Secondary | ICD-10-CM | POA: Diagnosis not present

## 2024-10-28 DIAGNOSIS — N1832 Chronic kidney disease, stage 3b: Secondary | ICD-10-CM | POA: Diagnosis present

## 2024-10-28 DIAGNOSIS — Z888 Allergy status to other drugs, medicaments and biological substances status: Secondary | ICD-10-CM | POA: Diagnosis not present

## 2024-10-28 DIAGNOSIS — Z887 Allergy status to serum and vaccine status: Secondary | ICD-10-CM | POA: Diagnosis not present

## 2024-10-28 DIAGNOSIS — I129 Hypertensive chronic kidney disease with stage 1 through stage 4 chronic kidney disease, or unspecified chronic kidney disease: Secondary | ICD-10-CM | POA: Diagnosis present

## 2024-10-28 DIAGNOSIS — E785 Hyperlipidemia, unspecified: Secondary | ICD-10-CM | POA: Diagnosis present

## 2024-10-28 DIAGNOSIS — Z794 Long term (current) use of insulin: Secondary | ICD-10-CM | POA: Diagnosis not present

## 2024-10-28 DIAGNOSIS — K8500 Idiopathic acute pancreatitis without necrosis or infection: Secondary | ICD-10-CM | POA: Diagnosis not present

## 2024-10-28 DIAGNOSIS — E872 Acidosis, unspecified: Secondary | ICD-10-CM | POA: Diagnosis present

## 2024-10-28 DIAGNOSIS — Z79899 Other long term (current) drug therapy: Secondary | ICD-10-CM | POA: Diagnosis not present

## 2024-10-28 DIAGNOSIS — Z833 Family history of diabetes mellitus: Secondary | ICD-10-CM | POA: Diagnosis not present

## 2024-10-28 DIAGNOSIS — L719 Rosacea, unspecified: Secondary | ICD-10-CM | POA: Diagnosis present

## 2024-10-28 DIAGNOSIS — K858 Other acute pancreatitis without necrosis or infection: Secondary | ICD-10-CM | POA: Diagnosis present

## 2024-10-28 DIAGNOSIS — E1122 Type 2 diabetes mellitus with diabetic chronic kidney disease: Secondary | ICD-10-CM | POA: Diagnosis present

## 2024-10-28 DIAGNOSIS — Z87891 Personal history of nicotine dependence: Secondary | ICD-10-CM | POA: Diagnosis not present

## 2024-10-28 DIAGNOSIS — D509 Iron deficiency anemia, unspecified: Secondary | ICD-10-CM | POA: Diagnosis present

## 2024-10-28 DIAGNOSIS — R001 Bradycardia, unspecified: Secondary | ICD-10-CM | POA: Diagnosis present

## 2024-10-28 DIAGNOSIS — Z7982 Long term (current) use of aspirin: Secondary | ICD-10-CM | POA: Diagnosis not present

## 2024-10-28 LAB — COMPREHENSIVE METABOLIC PANEL WITH GFR
ALT: 15 U/L (ref 0–44)
AST: 14 U/L — ABNORMAL LOW (ref 15–41)
Albumin: 3.4 g/dL — ABNORMAL LOW (ref 3.5–5.0)
Alkaline Phosphatase: 93 U/L (ref 38–126)
Anion gap: 12 (ref 5–15)
BUN: 35 mg/dL — ABNORMAL HIGH (ref 8–23)
CO2: 19 mmol/L — ABNORMAL LOW (ref 22–32)
Calcium: 9.3 mg/dL (ref 8.9–10.3)
Chloride: 107 mmol/L (ref 98–111)
Creatinine, Ser: 2.64 mg/dL — ABNORMAL HIGH (ref 0.61–1.24)
GFR, Estimated: 23 mL/min — ABNORMAL LOW
Glucose, Bld: 111 mg/dL — ABNORMAL HIGH (ref 70–99)
Potassium: 4.4 mmol/L (ref 3.5–5.1)
Sodium: 138 mmol/L (ref 135–145)
Total Bilirubin: 0.4 mg/dL (ref 0.0–1.2)
Total Protein: 5.6 g/dL — ABNORMAL LOW (ref 6.5–8.1)

## 2024-10-28 LAB — CBC
HCT: 37.2 % — ABNORMAL LOW (ref 39.0–52.0)
Hemoglobin: 12.5 g/dL — ABNORMAL LOW (ref 13.0–17.0)
MCH: 28 pg (ref 26.0–34.0)
MCHC: 33.6 g/dL (ref 30.0–36.0)
MCV: 83.4 fL (ref 80.0–100.0)
Platelets: 219 K/uL (ref 150–400)
RBC: 4.46 MIL/uL (ref 4.22–5.81)
RDW: 15.3 % (ref 11.5–15.5)
WBC: 13 K/uL — ABNORMAL HIGH (ref 4.0–10.5)
nRBC: 0 % (ref 0.0–0.2)

## 2024-10-28 LAB — CBG MONITORING, ED
Glucose-Capillary: 105 mg/dL — ABNORMAL HIGH (ref 70–99)
Glucose-Capillary: 97 mg/dL (ref 70–99)
Glucose-Capillary: 124 mg/dL — ABNORMAL HIGH (ref 70–99)

## 2024-10-28 LAB — GLUCOSE, CAPILLARY
Glucose-Capillary: 155 mg/dL — ABNORMAL HIGH (ref 70–99)
Glucose-Capillary: 208 mg/dL — ABNORMAL HIGH (ref 70–99)

## 2024-10-28 MED ORDER — POLYETHYLENE GLYCOL 3350 17 G PO PACK: 17.0000 g | PACK | Freq: Every day | ORAL | Status: DC | PRN

## 2024-10-28 MED ORDER — HYDRALAZINE HCL 20 MG/ML IJ SOLN: 10.0000 mg | Freq: Four times a day (QID) | INTRAMUSCULAR | Status: DC | PRN

## 2024-10-28 MED ORDER — ALBUTEROL SULFATE (2.5 MG/3ML) 0.083% IN NEBU: 2.5000 mg | INHALATION_SOLUTION | Freq: Four times a day (QID) | RESPIRATORY_TRACT | Status: DC | PRN

## 2024-10-28 MED ORDER — INSULIN ASPART 100 UNIT/ML IJ SOLN
0.0000 [IU] | Freq: Three times a day (TID) | INTRAMUSCULAR | Status: DC
  Administered 2024-10-29: 2 [IU] via SUBCUTANEOUS
  Administered 2024-10-29 – 2024-10-30 (×2): 1 [IU] via SUBCUTANEOUS
  Filled 2024-10-28 (×3): qty 1

## 2024-10-28 MED ORDER — INSULIN GLARGINE-YFGN 100 UNIT/ML ~~LOC~~ SOLN: 10.0000 [IU] | Freq: Every day | SUBCUTANEOUS | Status: DC

## 2024-10-28 MED ORDER — ENOXAPARIN SODIUM 30 MG/0.3ML IJ SOSY
30.0000 mg | PREFILLED_SYRINGE | INTRAMUSCULAR | Status: DC
  Administered 2024-10-28 – 2024-10-29 (×2): 30 mg via SUBCUTANEOUS
  Filled 2024-10-28 (×2): qty 0.3

## 2024-10-28 MED ORDER — MORPHINE SULFATE (PF) 2 MG/ML IV SOLN: 4.0000 mg | Freq: Once | INTRAVENOUS | Status: DC

## 2024-10-28 MED ORDER — ORAL CARE MOUTH RINSE: 15.0000 mL | OROMUCOSAL | Status: DC | PRN

## 2024-10-28 MED ORDER — INSULIN ASPART 100 UNIT/ML IJ SOLN
0.0000 [IU] | Freq: Every day | INTRAMUSCULAR | Status: DC
  Administered 2024-10-28: 2 [IU] via SUBCUTANEOUS
  Filled 2024-10-28: qty 1

## 2024-10-28 MED ORDER — INSULIN GLARGINE 100 UNIT/ML ~~LOC~~ SOLN
10.0000 [IU] | Freq: Every day | SUBCUTANEOUS | Status: AC
  Administered 2024-10-28 – 2024-10-29 (×2): 10 [IU] via SUBCUTANEOUS
  Filled 2024-10-28 (×2): qty 0.1

## 2024-10-28 MED ORDER — BISACODYL 5 MG PO TBEC: 5.0000 mg | DELAYED_RELEASE_TABLET | Freq: Every day | ORAL | Status: DC | PRN

## 2024-10-28 NOTE — H&P (Signed)
 " [facility] History and Physical  Keith Drake [name] DOB: Sep 28, 1935 DOA: 10/27/2024 PCP: [name], MD  Presented from: Home Chief Complaint: Abdominal pain, nausea, vomiting   History of Present Illness: Keith Drake is a 89 y.o. male with PMH significant for DM2, HTN, HLD, heart block s/p PPM, CKD, gout, spondylolisthesis.  1/2, patient presented to ED at [facility] with complaint of progressively worsening abdominal pain, nausea, vomiting for 1 week.  No diarrhea, bleeding or melena.  In the ED, patient is afebrile, heart rate in 70s, blood pressure 150s. Initial labs with WC count 13.2, BUN/creatinine 40/3.1, lipase elevated to 255, AST ALT alk phos bilirubin normal, troponin mildly elevated to 69, Blood glucose level elevated 390, serum osmolality elevated to 314, beta-hydroxybutyrate mildly elevated, serum bicarb mildly low at 16.  CT abdomen pelvis showed mild diffuse peripancreatic stranding consistent with acute interstitial edematous pancreatitis without collection. In the ED, patient was started on IV fluid, IV insulin , IV analgesics. Hospitalist service was consulted.  Patient was accepted for inpatient care at [facility] last night but because of lack of inpatient bed, patient had to wait in the ED for last 24 hours.  I evaluated the patient after he arrived to [facility] this afternoon. In the last 24 hours while waiting in the ED, he has remained afebrile, heart rate in 60s and 70s, blood pressure fluctuating between 160s and 190s, breathing on room air. Most recent labs from this morning with WC count 13, hemoglobin 12.5, BUN/creatinine 35/2.64, seems lipase has not been repeated today.  At the time of my evaluation, patient was propped up in bed.  Not in distress.  Alert, awake, oriented x 3.  Very hard of hearing though.  His son Selinda was at bedside. Patient tolerated clear liquid diet.  Reports that earlier today he vomited while in the ED.  He believes it  was because he was given gravy which he does not like.  Review of Systems:  All systems were reviewed and were negative unless otherwise mentioned in the HPI   Past medical history: Past Medical History:  Diagnosis Date   Anemia    Arthritis    OCC LEG PAIN   Bladder cancer (HCC) TCC   History of bladder carcinoma; S/P BCG TX'S ; never treated for cancer; it was a spot on my bladder (07/20/2018)   CKD (chronic kidney disease), stage III (HCC)    History of gout    on daily RX (07/20/2018)   History of kidney stones    Hyperlipidemia    Hypertension    Impaired hearing BILATERAL    OCCASIONAL WEARS HEARING AIDS   Mobitz I    thelbert 07/20/2018   Pneumonia  ~ 1980s X 1   Presence of permanent cardiac pacemaker 07/20/2018   Right bundle branch block    Spondylolisthesis    Type II diabetes mellitus (HCC)     Past surgical history: Past Surgical History:  Procedure Laterality Date   COLONOSCOPY  03/2004   CYSTO / RESECTIONAL BLADDER BX'S  05-01-2011   CYSTOSCOPY WITH BIOPSY  10/05/2011   Procedure: CYSTOSCOPY WITH BIOPSY;  Surgeon: Anes JAYSON Rafter, MD;  Location: [facility];  Service: Urology;  Laterality: N/A;  BLADDER BIOPSY    CYSTOSCOPY WITH LITHOLAPAXY     EXCISIONAL HEMORRHOIDECTOMY  1980s   INSERT / REPLACE / REMOVE PACEMAKER  07/20/2018   Implantation of new dual chamber permanent pacemaker   PACEMAKER IMPLANT N/A 07/20/2018   Procedure:  PACEMAKER IMPLANT;  Surgeon: Francyne Headland, MD;  Location: [facility] INVASIVE CV LAB;  Service: Cardiovascular;  Laterality: N/A;   REFRACTIVE SURGERY Bilateral    TRANSURETHRAL RESECTION OF BLADDER TUMOR  03-02-2011   US  ECHOCARDIOGRAPHY  07-11-2009   EF 55-60%    Social History:  reports that he has quit smoking. His smoking use included cigars. He has never used smokeless tobacco. He reports that he does not currently use alcohol. He reports that he does not use drugs.  Allergies:  Allergies[1] Donepezil,  Fosinopril, Influenza virus vaccine, Oxycodone , Rosuvastatin , Tadalafil, Valsartan, and Fenofibrate   Family history:  Family History  Problem Relation Age of Onset   Colon cancer Brother    Cancer Brother    Cancer Brother    Diabetes Sister      Physical Exam: Vitals:   10/28/24 1300 10/28/24 1315 10/28/24 1330 10/28/24 1500  BP: (!) 178/94 (!) 183/98 (!) 182/96 (!) 182/83  Pulse: 73 73 72 70  Resp: 14 17 17 20   Temp:    98.4 F (36.9 C)  TempSrc:    Oral  SpO2: 100% 99% 100% 98%  Weight:      Height:       Wt Readings from Last 3 Encounters:  10/27/24 81.1 kg  03/25/24 81.1 kg  10/12/23 82.6 kg   Body mass index is 25.65 kg/m.  General exam: Pleasant, elderly male.  Feels better than at presentation. Skin: No rashes, lesions or ulcers. HEENT: Atraumatic, normocephalic, no obvious bleeding Lungs: Clear to auscultation bilaterally,  CVS: S1, S2, no murmur,   GI/Abd: Soft, mild epigastric tenderness, nondistended, bowel sound present,   CNS: Alert, awake, oriented x 3.  Very hard of hearing Psychiatry: Mood appropriate Extremities: No pedal edema, no calf tenderness,    ----------------------------------------------------------------------------------------------------------------------------------------- ----------------------------------------------------------------------------------------------------------------------------------------- -----------------------------------------------------------------------------------------------------------------------------------------  Assessment/Plan: Acute interstitial pancreatitis Presented with worsening abdomen pain nausea vomiting for 1 week. Lipase level mildly elevated with LFTs normal CT abdomen pelvis showed acute interstitial edematous pancreatitis without collection. Son states this is his second episode of pancreatitis, last 1 was a year ago. Patient reports he does not drink alcohol.   Started on  conservative management with IV fluid, IV analgesics, IV antiemetics, clear liquid diet Symptoms -abdominal pain improving.  Nausea vomiting improving.  Able to tolerate clear liquid diet. I will continue him on clear liquid diet for now. Continue LR at 100 mL per per hour Repeat lipase in a.m. Pain regimen --- PRN: Tylenol , IV Dilaudid  Recent Labs  Lab 10/27/24 1726  LIPASE 255*   Hyperosmolar hyperglycemic nonketotic state Type 2 diabetes mellitus uncontrolled A1c 11.3 in May 2025.  Repeat A1c. Presented with elevated blood sugar level, elevated serum osmolality, anion gap With IV hydration and insulin , sugar level is improving. Currently on SSI/Accu-Cheks.  10 units Lantus  ordered for tonight.   Recent Labs  Lab 10/27/24 2305 10/28/24 0401 10/28/24 0801 10/28/24 1116 10/28/24 1517  GLUCAP 220* 105* 97 124* 155*   AKI on CKD 4 Acute metabolic acidosis Baseline creatinine 2.7 from June 2025.  Presented with creatinine elevated 3.1 and bicarb low at 16.  Both improving with IV fluid. Continue to monitor  Recent Labs    03/24/24 1548 03/25/24 0747 03/26/24 0243 03/27/24 0406 03/28/24 0814 10/27/24 1726 10/27/24 2241 10/28/24 0547  BUN 44* 37* 35* 38* 44* 40* 38* 35*  CREATININE 3.05* 2.38* 2.33* 2.93* 2.70* 3.10* 2.94* 2.64*  CO2 16* 20* 19* 21* 16* 16* 17* 19*   Hypertension Blood pressure elevated to 150s  and 180s. PTA meds-patient is not able to recall his home meds.  Per patient's son at bedside, his other son who is in the main caretaker will bring release later today.  Heart block s/p PPM  HLD Aspirin  81 mg daily,  Spondylolisthesis Gout Not on any scheduled pain meds at home  BPH Flomax   Right inguinal lymphadenopathy Noted on CT scan.  No evidence of lower extremity infection  Left kidney lesion  CT abdomen noted of a mildly hyperattenuating 1.1 cm focus in the lateral lower pole left kidney may represent a  hemorrhagic/proteinaceous cyst,  however is incompletely characterized.  Recommend further evaluation with nonemergent renal protocol MRI abdomen as an outpatient.  Home med list has not been obtained/updated by [facility] at this time.  Please double check admission reconciliation.    Mobility: Ambulates with a cane at home. PT Orders:   PT Follow up Rec:    Goals of care:   Code Status: Full Code    DVT prophylaxis:  enoxaparin  (LOVENOX ) injection 30 mg Start: 10/28/24 1615   Antimicrobials: None Fluid: LR at 100 mL/h Consultants: None Family Communication: Son at bedside  Status: Inpatient Level of care:  Telemetry   Patient is from: Home Anticipated d/c to: Hopefully home in 2 to 3 days, pending PT eval  Diet:  Diet Order             Diet clear liquid Room service appropriate? Yes; Fluid consistency: Thin  Diet effective now                    ------------------------------------------------------------------------------------- Severity of Illness: The appropriate patient status for this patient is INPATIENT. Inpatient status is judged to be reasonable and necessary in order to provide the required intensity of service to ensure the patient's safety. The patient's presenting symptoms, physical exam findings, and initial radiographic and laboratory data in the context of their chronic comorbidities is felt to place them at high risk for further clinical deterioration. Furthermore, it is not anticipated that the patient will be medically stable for discharge from the hospital within 2 midnights of admission.   * I certify that at the point of admission it is my clinical judgment that the patient will require inpatient hospital care spanning beyond 2 midnights from the point of admission due to high intensity of service, high risk for further deterioration and high frequency of surveillance required.* -------------------------------------------------------------------------------------   Home  Meds: Prior to Admission medications  Medication Sig Start Date End Date Taking? Authorizing Provider  allopurinol  (ZYLOPRIM ) 100 MG tablet Take 100 mg by mouth daily. 07/07/18   [provider]  amLODipine  (NORVASC ) 10 MG tablet Take 10 mg by mouth daily.  12/09/15   [provider]  aspirin  81 MG tablet Take 81 mg by mouth daily.    [provider]  carvedilol  (COREG ) 3.125 MG tablet Take 1 tablet (3.125 mg total) by mouth 2 (two) times daily with a meal. 03/28/24   Elgergawy, Brayton RAMAN, MD  cyanocobalamin  1000 MCG tablet Take 1 tablet (1,000 mcg total) by mouth daily. 07/21/23   Amin, Sumayya, MD  esomeprazole  (NEXIUM ) 20 MG capsule Take 1 capsule (20 mg total) by mouth daily. 07/21/23 07/20/24  Amin, Sumayya, MD  Fe Fum-Vit C-Vit B12-FA (TRIGELS-F FORTE) CAPS capsule Take 1 capsule by mouth 2 (two) times daily. 07/21/23   Amin, Sumayya, MD  insulin  lispro (HUMALOG  KWIKPEN) 100 UNIT/ML KwikPen Inject 4 Units into the skin 3 (three) times daily. Patient  taking differently: Inject 8 Units into the skin 2 (two) times daily. 08/11/23   Cindy Garnette POUR, MD  minocycline  (MINOCIN ) 100 MG capsule Take 200 mg by mouth 2 (two) times daily. 03/05/24   [provider]  sodium bicarbonate  650 MG tablet Take 650 mg by mouth daily. 06/03/23   [provider]  tamsulosin  (FLOMAX ) 0.4 MG CAPS capsule Take 1 capsule (0.4 mg total) by mouth daily after supper. 03/28/24   Elgergawy, Brayton RAMAN, MD  TRESIBA  FLEXTOUCH 100 UNIT/ML FlexTouch Pen Inject 20 Units into the skin daily. 03/28/24   Elgergawy, Brayton RAMAN, MD    Labs on Admission:   CBC: Recent Labs  Lab 10/27/24 1726 10/28/24 0547  WBC 13.2* 13.0*  HGB 13.2 12.5*  HCT 38.9* 37.2*  MCV 83.1 83.4  PLT 228 219    Basic Metabolic Panel: Recent Labs  Lab 10/27/24 1726 10/27/24 2241 10/28/24 0547  NA 135 138 138  K 4.5 4.3 4.4  CL 102 109 107  CO2 16* 17* 19*  GLUCOSE 390* 221* 111*  BUN 40* 38* 35*  CREATININE  3.10* 2.94* 2.64*  CALCIUM  9.6 9.1 9.3    Liver Function Tests: Recent Labs  Lab 10/27/24 1726 10/28/24 0547  AST 18 14*  ALT 20 15  ALKPHOS 110 93  BILITOT 0.3 0.4  PROT 6.6 5.6*  ALBUMIN 3.9 3.4*   Recent Labs  Lab 10/27/24 1726  LIPASE 255*   No results for input(s): AMMONIA in the last 168 hours.  Cardiac Enzymes: No results for input(s): CKTOTAL, CKMB, CKMBINDEX, TROPONINI in the last 168 hours.  BNP (last 3 results) No results for input(s): BNP in the last 8760 hours.  ProBNP (last 3 results) No results for input(s): PROBNP in the last 8760 hours.  CBG: Recent Labs  Lab 10/27/24 2305 10/28/24 0401 10/28/24 0801 10/28/24 1116 10/28/24 1517  GLUCAP 220* 105* 97 124* 155*    Lipase     Component Value Date/Time   LIPASE 255 (H) 10/27/2024 1726     Urinalysis    Component Value Date/Time   COLORURINE YELLOW 10/27/2024 2240   APPEARANCEUR CLEAR 10/27/2024 2240   LABSPEC 1.020 10/27/2024 2240   PHURINE 6.0 10/27/2024 2240   GLUCOSEU >=500 (A) 10/27/2024 2240   HGBUR TRACE (A) 10/27/2024 2240   BILIRUBINUR NEGATIVE 10/27/2024 2240   KETONESUR NEGATIVE 10/27/2024 2240   PROTEINUR 100 (A) 10/27/2024 2240   NITRITE NEGATIVE 10/27/2024 2240   LEUKOCYTESUR NEGATIVE 10/27/2024 2240     Drugs of Abuse  No results found for: LABOPIA, COCAINSCRNUR, LABBENZ, AMPHETMU, THCU, LABBARB    Radiological Exams on Admission: CT ABDOMEN PELVIS WO CONTRAST Result Date: 10/27/2024 CLINICAL DATA:  History of pancreatitis with abdominal pain, nausea, vomiting, and diarrhea EXAM: CT ABDOMEN AND PELVIS WITHOUT CONTRAST TECHNIQUE: Multidetector CT imaging of the abdomen and pelvis was performed following the standard protocol without IV contrast. RADIATION DOSE REDUCTION: This exam was performed according to the departmental dose-optimization program which includes automated exposure control, adjustment of the mA and/or kV according to patient size  and/or use of iterative reconstruction technique. COMPARISON:  CT abdomen and pelvis dated 08/08/2023 FINDINGS: Lower chest: No focal consolidation or pulmonary nodule in the lung bases. No pleural effusion or pneumothorax demonstrated. Partially imaged heart size is normal. Partially imaged cardiac leads, one of which terminates in the right ventricle. Hepatobiliary: Unchanged scattered subcentimeter hypodensities, likely cysts. No intra or extrahepatic biliary ductal dilation. Normal gallbladder. Pancreas: Mild diffuse peripancreatic stranding. No peripancreatic  fluid collection. Spleen: Normal in size without focal abnormality. Adrenals/Urinary Tract: No adrenal nodules. Bilateral simple/minimally complicated cysts measuring up to 1.8 cm on the left (2:31). A mildly hyperattenuating 1.1 cm focus in the lateral lower pole left kidney (2:34) is incompletely characterized. Punctate nonobstructing left lower pole renal stone. No hydronephrosis. No focal bladder wall thickening. Stomach/Bowel: Normal appearance of the stomach. No evidence of bowel wall thickening, distention, or inflammatory changes. Appendix is not discretely seen. Vascular/Lymphatic: Aortic atherosclerosis. New 13 mm right inguinal lymphadenopathy (2:84). Reproductive: Prostate is unremarkable. Other: No free fluid, fluid collection, or free air. Musculoskeletal: No acute or abnormal lytic or blastic osseous lesions. L4-5 spinal fusion hardware appear intact. Multilevel degenerative changes of the partially imaged thoracic and lumbar spine. Bilateral lower anterior abdominal wall subcutaneous soft tissue stranding may be injection related. IMPRESSION: 1. Mild diffuse peripancreatic stranding, consistent with acute interstitial edematous pancreatitis. No peripancreatic fluid collection. 2. New 13 mm right inguinal lymphadenopathy, likely reactive. Recommend correlation with physical examination of the right lower extremity. 3. A mildly  hyperattenuating 1.1 cm focus in the lateral lower pole left kidney may represent a hemorrhagic/proteinaceous cyst, however is incompletely characterized. Recommend further evaluation with nonemergent renal protocol MRI abdomen. 4.  Aortic Atherosclerosis (ICD10-I70.0). Electronically Signed   By: Limin  Xu M.D.   On: 10/27/2024 19:21     Signed, Chapman Rota, MD [facility] 10/28/2024        [1]  Allergies Allergen Reactions   Donepezil     Other Reaction(s): bad dreams, diarrhea   Fosinopril Cough   Influenza Virus Vaccine     Other Reaction(s): he said it makes him sick. won't take anymore.   Oxycodone      Other Reaction(s): took two after back surgery and it put him in another world   Rosuvastatin      Other Reaction(s): muscle cramps   Tadalafil     Other Reaction(s): head blown off   Valsartan     Other Reaction(s): stopped 10/18 by dr. douglass for inc. in creatinine.   Fenofibrate Rash   "

## 2024-10-29 DIAGNOSIS — K8500 Idiopathic acute pancreatitis without necrosis or infection: Secondary | ICD-10-CM | POA: Diagnosis not present

## 2024-10-29 LAB — GLUCOSE, CAPILLARY
Glucose-Capillary: 95 mg/dL (ref 70–99)
Glucose-Capillary: 138 mg/dL — ABNORMAL HIGH (ref 70–99)
Glucose-Capillary: 157 mg/dL — ABNORMAL HIGH (ref 70–99)
Glucose-Capillary: 148 mg/dL — ABNORMAL HIGH (ref 70–99)

## 2024-10-29 LAB — CBC
HCT: 34.5 % — ABNORMAL LOW (ref 39.0–52.0)
Hemoglobin: 11.7 g/dL — ABNORMAL LOW (ref 13.0–17.0)
MCH: 28.2 pg (ref 26.0–34.0)
MCHC: 33.9 g/dL (ref 30.0–36.0)
MCV: 83.1 fL (ref 80.0–100.0)
Platelets: 192 K/uL (ref 150–400)
RBC: 4.15 MIL/uL — ABNORMAL LOW (ref 4.22–5.81)
RDW: 15.6 % — ABNORMAL HIGH (ref 11.5–15.5)
WBC: 11.8 K/uL — ABNORMAL HIGH (ref 4.0–10.5)
nRBC: 0 % (ref 0.0–0.2)

## 2024-10-29 LAB — LIPASE, BLOOD: Lipase: 592 U/L — ABNORMAL HIGH (ref 11–51)

## 2024-10-29 LAB — COMPREHENSIVE METABOLIC PANEL WITH GFR
ALT: 13 U/L (ref 0–44)
AST: 14 U/L — ABNORMAL LOW (ref 15–41)
Albumin: 3 g/dL — ABNORMAL LOW (ref 3.5–5.0)
Alkaline Phosphatase: 93 U/L (ref 38–126)
Anion gap: 10 (ref 5–15)
BUN: 34 mg/dL — ABNORMAL HIGH (ref 8–23)
CO2: 19 mmol/L — ABNORMAL LOW (ref 22–32)
Calcium: 9.1 mg/dL (ref 8.9–10.3)
Chloride: 108 mmol/L (ref 98–111)
Creatinine, Ser: 2.61 mg/dL — ABNORMAL HIGH (ref 0.61–1.24)
GFR, Estimated: 23 mL/min — ABNORMAL LOW
Glucose, Bld: 97 mg/dL (ref 70–99)
Potassium: 4.7 mmol/L (ref 3.5–5.1)
Sodium: 136 mmol/L (ref 135–145)
Total Bilirubin: 0.5 mg/dL (ref 0.0–1.2)
Total Protein: 5.3 g/dL — ABNORMAL LOW (ref 6.5–8.1)

## 2024-10-29 LAB — HEMOGLOBIN A1C
Hgb A1c MFr Bld: 8.8 % — ABNORMAL HIGH (ref 4.8–5.6)
Mean Plasma Glucose: 205.86 mg/dL

## 2024-10-29 MED ORDER — SODIUM BICARBONATE 650 MG PO TABS
650.0000 mg | ORAL_TABLET | Freq: Every day | ORAL | Status: DC
  Administered 2024-10-29 – 2024-10-30 (×2): 650 mg via ORAL
  Filled 2024-10-29 (×2): qty 1

## 2024-10-29 MED ORDER — TAMSULOSIN HCL 0.4 MG PO CAPS
0.4000 mg | ORAL_CAPSULE | Freq: Every day | ORAL | Status: DC
  Administered 2024-10-29: 0.4 mg via ORAL
  Filled 2024-10-29: qty 1

## 2024-10-29 MED ORDER — INSULIN GLARGINE-YFGN 100 UNIT/ML ~~LOC~~ SOLN
10.0000 [IU] | Freq: Every day | SUBCUTANEOUS | Status: DC
  Filled 2024-10-29: qty 0.1

## 2024-10-29 MED ORDER — ASPIRIN 81 MG PO TBEC
81.0000 mg | DELAYED_RELEASE_TABLET | Freq: Every day | ORAL | Status: DC
  Administered 2024-10-29 – 2024-10-30 (×2): 81 mg via ORAL
  Filled 2024-10-29 (×2): qty 1

## 2024-10-29 MED ORDER — MINOCYCLINE HCL 50 MG PO CAPS
100.0000 mg | ORAL_CAPSULE | Freq: Two times a day (BID) | ORAL | Status: DC
  Administered 2024-10-29 – 2024-10-30 (×3): 100 mg via ORAL
  Filled 2024-10-29 (×4): qty 2

## 2024-10-29 MED ORDER — AMLODIPINE BESYLATE 10 MG PO TABS
10.0000 mg | ORAL_TABLET | Freq: Every day | ORAL | Status: DC
  Administered 2024-10-29 – 2024-10-30 (×2): 10 mg via ORAL
  Filled 2024-10-29 (×2): qty 1

## 2024-10-29 MED ORDER — CARVEDILOL 3.125 MG PO TABS
3.1250 mg | ORAL_TABLET | Freq: Two times a day (BID) | ORAL | Status: DC
  Administered 2024-10-29 – 2024-10-30 (×3): 3.125 mg via ORAL
  Filled 2024-10-29 (×3): qty 1

## 2024-10-29 MED ORDER — SODIUM CHLORIDE 0.9 % IV SOLN: INTRAVENOUS | Status: AC

## 2024-10-29 MED ORDER — PANTOPRAZOLE SODIUM 40 MG PO TBEC
40.0000 mg | DELAYED_RELEASE_TABLET | Freq: Every day | ORAL | Status: DC
  Administered 2024-10-29 – 2024-10-30 (×2): 40 mg via ORAL
  Filled 2024-10-29 (×2): qty 1

## 2024-10-29 NOTE — Evaluation (Addendum)
 Physical Therapy Evaluation and Discharge Patient Details Name: Keith Drake MRN: [id number] DOB: [birth year] Today's Date: 10/29/2024  History of Present Illness  Pt is a 89 y.o. male who presents to [facility] 10/27/24 with abd pain, nausea, vomiting. +acute pancreatitis; PMH: hypertension, insulin -dependent diabetes mellitus, CKD stage 3B, gout, bladder cancer, and heart block with pacemaker.   Clinical Impression  Pt admitted with above diagnosis. PTA, pt was modI for functional mobility using a SPC, modI with ADLs, and driving. He lives alone in a one story mobile home with 2 STE. Pt performed bed mobility with modI and required SBA for transfers/gait using SPC and CGA for stairs. He ambulated a household distance with a step-through gait pattern. Pt ascended/descended 2 steps with LUE support on handrail using a step-to pattern. He denies pain. Pt appears to be near his baseline function, no further acute PT needs identified. Encourage frequent mobilization with staff assistance, OOB<>chair 3x/day if a recliner chair can be delivered to his room, and the mobility team to follow him while he is admitted.     If plan is discharge home, recommend the following: A little help with walking and/or transfers;A little help with bathing/dressing/bathroom;Assistance with cooking/housework;Assist for transportation;Help with stairs or ramp for entrance   Can travel by private vehicle        Equipment Recommendations None recommended by PT  Recommendations for Other Services       Functional Status Assessment Patient has not had a recent decline in their functional status     Precautions / Restrictions Precautions Precautions: Fall Recall of Precautions/Restrictions: Intact Restrictions Weight Bearing Restrictions Per Provider Order: No      Mobility  Bed Mobility Overal bed mobility: Modified Independent             General bed mobility comments: HOB elevated. Increased time.     Transfers Overall transfer level: Needs assistance Equipment used: Straight cane Transfers: Sit to/from Stand Sit to Stand: Supervision, Contact guard assist           General transfer comment: Pt stood from lowest bed height. He powered up with CGA. SPC in RUE. Good eccentric control. Pt without a recliner chair present in his room, spoke with RN to attempt to get pt one.    Ambulation/Gait Ambulation/Gait assistance: Contact guard assist, Supervision Gait Distance (Feet): 125 Feet Assistive device: Straight cane Gait Pattern/deviations: Step-through pattern, Decreased stride length Gait velocity: reduced Gait velocity interpretation: <1.8 ft/sec, indicate of risk for recurrent falls   General Gait Details: Pt ambulated with SPC in RUE advancing LLE at the same time. He took steady steps. Maintained upright posture. Pt intermittently paused and rested both hands on top of can to answer questions. Navigated room/hallway well, no LOB.  Stairs Stairs: Yes Stairs assistance: Contact guard assist Stair Management: One rail Left, Forwards, Step to pattern Number of Stairs: 2 General stair comments: Pt ascended leading with RLE and descended leading with LLE one step at a time. He kept SPC opposite rail and advanced appropriately. Pt reports completing nearly a flight of stairs to spend time with his grandkids at his son's house.  Wheelchair Mobility     Tilt Bed    Modified Rankin (Stroke Patients Only)       Balance Overall balance assessment: Mild deficits observed, not formally tested  Pertinent Vitals/Pain Pain Assessment Pain Assessment: No/denies pain    Home Living Family/patient expects to be discharged to:: Private residence Living Arrangements: Alone Available Help at Discharge: Family;Available PRN/intermittently Type of Home: Mobile home Home Access: Stairs to enter Entrance Stairs-Rails:  Left Entrance Stairs-Number of Steps: 2   Home Layout: One level Home Equipment: Rollator (4 wheels);Cane - single point;Shower seat;Cane - quad;Grab bars - tub/shower;Rolling Walker (2 wheels)      Prior Function Prior Level of Function : Independent/Modified Independent;Driving             Mobility Comments: Ambulates using SPC. Denies fall hx. ADLs Comments: ModI for ADLs, self care tasks. Drives.     Extremity/Trunk Assessment   Upper Extremity Assessment Upper Extremity Assessment: Overall WFL for tasks assessed    Lower Extremity Assessment Lower Extremity Assessment: Overall WFL for tasks assessed    Cervical / Trunk Assessment Cervical / Trunk Assessment: Normal  Communication   Communication Communication: Impaired Factors Affecting Communication: Hearing impaired (very HOH)    Cognition Arousal: Alert Behavior During Therapy: WFL for tasks assessed/performed   PT - Cognitive impairments: No apparent impairments                       PT - Cognition Comments: Pt A,Ox4 Following commands: Intact       Cueing Cueing Techniques: Verbal cues     General Comments General comments (skin integrity, edema, etc.): VSS on RA    Exercises     Assessment/Plan    PT Assessment Patient does not need any further PT services  PT Problem List         PT Treatment Interventions      PT Goals (Current goals can be found in the Care Plan section)  Acute Rehab PT Goals PT Goal Formulation: All assessment and education complete, DC therapy    Frequency       Co-evaluation               AM-PAC PT 6 Clicks Mobility  Outcome Measure Help needed turning from your back to your side while in a flat bed without using bedrails?: None Help needed moving from lying on your back to sitting on the side of a flat bed without using bedrails?: None Help needed moving to and from a bed to a chair (including a wheelchair)?: A Little Help needed  standing up from a chair using your arms (e.g., wheelchair or bedside chair)?: A Little Help needed to walk in hospital room?: A Little Help needed climbing 3-5 steps with a railing? : A Little 6 Click Score: 20    End of Session Equipment Utilized During Treatment: Gait belt Activity Tolerance: Patient tolerated treatment well Patient left: in bed;with call bell/phone within reach;with bed alarm set;with family/visitor present Nurse Communication: Mobility status PT Visit Diagnosis: Other abnormalities of gait and mobility (R26.89)    Time: 8464-8440 PT Time Calculation (min) (ACUTE ONLY): 24 min   Charges:   PT Evaluation $PT Eval Moderate Complexity: 1 Mod PT Treatments $Gait Training: 8-22 mins PT General Charges $$ ACUTE PT VISIT: 1 Visit         Randall SAUNDERS, PT, DPT Acute Rehabilitation Services Office: [phone] Secure Chat Preferred  Delon CHRISTELLA Callander 10/29/2024, 5:03 PM

## 2024-10-29 NOTE — Progress Notes (Signed)
 [facility]  Danetta Fish [name]  DOB: May 27, 1935  DOA: 10/27/2024  PCP: [name], MD  10/29/2024,1:46 PM  LOS: 1 day    Code Status: Full code     from: Home   89 year old male known HTN diabetes type 2 on insulin  CKD 3B PPM placed symptomatic bradycardia and September 2019-developed subsequently right bundle branch block with Mobitz 1 a Previous admissions for hyperosmolar state Plif  L4-L5 [name] History of rosacea on minocycline  Sees [name] for mild normocytic anemia likely secondary to B12 and iron deficiency-has a history of prostate cancer Admitted [phone] 2024 for pancreatitis hide was told to follow-up with [name] in the outpatient --not sure if he ever followed up--was told by [name] at the end that hyperparathyroidism could have caused this-MRI was at follow-up-unclear performed also at the calcium  was a little bit high which improved with IV fluid-  Return to hospital 10/27/2024 with nausea vomiting fatigue, some mid abdominal pain increasing reflux symptoms unsure last colonoscopy Sodium 136 potassium 4.7 BUN/creatinine 34/2.6 lipase 592 AST/ALT 14/13 bilirubin 0.5--WBC 11.8 hemoglobin 11.7 platelet 192-imaging showed diffuse per-pancreatitis stranding with acute interstitial edematous pancreatitis 13 mm right inguinal lymphadenopathy which was reactive--hyperattenuating 1.1 cm lateral lower pole left kidney focus   Assessment  & Plan :    Cryptogenic pancreatitis White count slightly elevated but no overt findings noted on CT imaging Never obtained MR abdomen pelvis in the outpatient setting for further characterization-secure message [place] [facility] [name] to make sure not lost follow-up again Graduate diet to regular and see how he does Expect can discharge in 1 to 2 days if he is able to get up and move around in the outpatient setting  AKI on admission superimposed on CKD 3B Start NS 40 cc/H-labs in a.m.  DM TY 2 (Tresiba  20, lispro 4 units 3 times  daily at home)-A1c here 8.8 Continues on sensitive SSI with 10 of Lantus -stop at bedtime coverage CBGs range 95-1 38  Bradycardia Mobitz 1 ppm placed Continues on amlodipine  10 Coreg  3.125 twice daily-for pressure >160 hydralazine  10  CKD 3B BPH Continue bicarb 650 daily-monitor-hold fluids Continue Flomax  0.4   Data Reviewed today: Sodium 136 potassium 4.7 bicarb 19 BUN/creatinine 34/2.6 lipase 592 AST/ALT 14/13 bilirubin 0.5 WBC 11.8 hemoglobin 11.7 platelet 192  DVT prophylaxis: Lovenox    Dispo/Global plan: Inpatient     Subjective:   Pleasant awake alert no distress Tolerating soft liquid diet No chest pain no fever He is able to keep down food has not vomited since he has been here no stool  Objective + exam Vitals:   10/28/24 2320 10/29/24 0400 10/29/24 0847 10/29/24 1129  BP: (!) 156/80  (!) 165/80 (!) 148/74  Pulse: 73 65 76 99  Resp: 20 17 20  (!) 23  Temp: 99.5 F (37.5 C)  98.6 F (37 C) 98.4 F (36.9 C)  TempSrc: Oral  Oral Oral  SpO2: 98% 98% 98% 98%  Weight:      Height:       Filed Weights   10/27/24 1341  Weight: 81.1 kg     Examination: EOMI NCAT no focal deficit-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-has very large hands power 5/5 abdomen soft no rebound no guarding no right upper quadrant tenderness no lower extremity edema chest clear seems to have PVCs on monitor S1-S2 no murmur psych euthymic coherent pleasant oriented     Scheduled Meds:  amLODipine   10 mg Oral Daily   aspirin  EC  81 mg Oral Daily  carvedilol   3.125 mg Oral BID WC   enoxaparin  (LOVENOX ) injection  30 mg Subcutaneous Q24H   insulin  aspart  0-5 Units Subcutaneous QHS   insulin  aspart  0-9 Units Subcutaneous TID WC   insulin  glargine  10 Units Subcutaneous QHS   minocycline   100 mg Oral BID   ondansetron  (ZOFRAN ) IV  4 mg Intravenous Once   pantoprazole   40 mg Oral Daily   sodium bicarbonate   650 mg Oral Daily   tamsulosin   0.4 mg Oral QPC supper   Continuous  Infusions: acetaminophen , albuterol , bisacodyl , hydrALAZINE , HYDROmorphone  (DILAUDID ) injection, ondansetron  (ZOFRAN ) IV, mouth rinse, polyethylene glycol  Time 55  Jai-Gurmukh Zanaya Baize, MD  [facility]

## 2024-10-30 DIAGNOSIS — K8500 Idiopathic acute pancreatitis without necrosis or infection: Secondary | ICD-10-CM | POA: Diagnosis not present

## 2024-10-30 LAB — CBC WITH DIFFERENTIAL/PLATELET
Abs Immature Granulocytes: 0.02 K/uL (ref 0.00–0.07)
Basophils Absolute: 0.1 K/uL (ref 0.0–0.1)
Basophils Relative: 1 %
Eosinophils Absolute: 0.6 K/uL — ABNORMAL HIGH (ref 0.0–0.5)
Eosinophils Relative: 7 %
HCT: 32.9 % — ABNORMAL LOW (ref 39.0–52.0)
Hemoglobin: 10.9 g/dL — ABNORMAL LOW (ref 13.0–17.0)
Immature Granulocytes: 0 %
Lymphocytes Relative: 45 %
Lymphs Abs: 3.9 K/uL (ref 0.7–4.0)
MCH: 28.5 pg (ref 26.0–34.0)
MCHC: 33.1 g/dL (ref 30.0–36.0)
MCV: 85.9 fL (ref 80.0–100.0)
Monocytes Absolute: 0.8 K/uL (ref 0.1–1.0)
Monocytes Relative: 9 %
Neutro Abs: 3.3 K/uL (ref 1.7–7.7)
Neutrophils Relative %: 38 %
Platelets: 153 K/uL (ref 150–400)
RBC: 3.83 MIL/uL — ABNORMAL LOW (ref 4.22–5.81)
RDW: 15.2 % (ref 11.5–15.5)
WBC: 8.6 K/uL (ref 4.0–10.5)
nRBC: 0 % (ref 0.0–0.2)

## 2024-10-30 LAB — COMPREHENSIVE METABOLIC PANEL WITH GFR
ALT: 11 U/L (ref 0–44)
AST: 15 U/L (ref 15–41)
Albumin: 2.8 g/dL — ABNORMAL LOW (ref 3.5–5.0)
Alkaline Phosphatase: 87 U/L (ref 38–126)
Anion gap: 8 (ref 5–15)
BUN: 34 mg/dL — ABNORMAL HIGH (ref 8–23)
CO2: 19 mmol/L — ABNORMAL LOW (ref 22–32)
Calcium: 9.1 mg/dL (ref 8.9–10.3)
Chloride: 107 mmol/L (ref 98–111)
Creatinine, Ser: 2.82 mg/dL — ABNORMAL HIGH (ref 0.61–1.24)
GFR, Estimated: 21 mL/min — ABNORMAL LOW
Glucose, Bld: 70 mg/dL (ref 70–99)
Potassium: 4.4 mmol/L (ref 3.5–5.1)
Sodium: 134 mmol/L — ABNORMAL LOW (ref 135–145)
Total Bilirubin: 0.4 mg/dL (ref 0.0–1.2)
Total Protein: 5 g/dL — ABNORMAL LOW (ref 6.5–8.1)

## 2024-10-30 LAB — GLUCOSE, CAPILLARY
Glucose-Capillary: 84 mg/dL (ref 70–99)
Glucose-Capillary: 125 mg/dL — ABNORMAL HIGH (ref 70–99)

## 2024-10-30 MED ORDER — SODIUM BICARBONATE 650 MG PO TABS: 650.0000 mg | ORAL_TABLET | Freq: Every day | ORAL | Status: AC

## 2024-10-30 NOTE — Progress Notes (Signed)
 Discharge Nurse Summary: DC order noted per MD. DC RN at bedside with patient. Patient agreeable with discharge plan, states family will arrive soon for pickup. AVS printed/reviewed. PIV removed, skin intact. No DME needs. No home/TOC meds. CP/Edu resolved. Telemonitor returned to charging station. All belongings accounted for including cane. NT wheeled patient downstairs for discharge by private auto.   Rosario EMERSON Lund, RN

## 2024-10-30 NOTE — Discharge Summary (Signed)
 Physician Discharge Summary  Keith Drake [name] DOB: Dec 05, 1934 DOA: 10/27/2024  PCP: [name], MD  Admit date: 10/27/2024 Discharge date: 10/30/2024  Time spent: 26 minutes  Recommendations for Outpatient Follow-up:  Outpatient MRI requested of [name] and [facility] [name] to make sure this is done Needs labs Chem-12 CBC INR 1 week  Discharge Diagnoses:  MAIN problem for hospitalization   Cryptogenic pancreatitis  Please see below for itemized issues addressed in HOpsital- refer to other progress notes for clarity if needed  Discharge Condition: Improved  Diet recommendation: Soft  Filed Weights   10/27/24 1341  Weight: 81.1 kg    History of present illness:   89 year old male known HTN diabetes type 2 on insulin  CKD 3B PPM placed symptomatic bradycardia and September 2019-developed subsequently right bundle branch block with Mobitz 1 a Previous admissions for hyperosmolar state Plif  L4-L5 [name] History of rosacea on minocycline  Sees [name] for mild normocytic anemia likely secondary to B12 and iron deficiency-has a history of prostate cancer Admitted [phone] 2024 for pancreatitis hide was told to follow-up with [name] in the outpatient --not sure if he ever followed up--was told by [name] at the end that hyperparathyroidism could have caused this-MRI was at follow-up-unclear performed also at the calcium  was a little bit high which improved with IV fluid-   Return to hospital 10/27/2024 with nausea vomiting fatigue, some mid abdominal pain increasing reflux symptoms unsure last colonoscopy Sodium 136 potassium 4.7 BUN/creatinine 34/2.6 lipase 592 AST/ALT 14/13 bilirubin 0.5--WBC 11.8 hemoglobin 11.7 platelet 192-imaging showed diffuse per-pancreatitis stranding with acute interstitial edematous pancreatitis 13 mm right inguinal lymphadenopathy which was reactive--hyperattenuating 1.1 cm lateral lower pole left kidney focus    Assessment  & Plan :       Cryptogenic pancreatitis White count slightly elevated but no overt findings noted on CT imaging. Never obtained MR abdomen pelvis in the outpatient-secure message [name] [name] to make sure not lost follow-up again Tolerating diet doing well and going home  AKI on admission superimposed on CKD 3B Started on saline-he is close to his baseline-I cut back his bicarb-he can go home   DM TY 2 (Tresiba  20, lispro 4 units 3 times daily at home)-A1c here 8.8 Blood sugars well-controlled below 150-he is able to discharge home on his usual medications   Bradycardia Mobitz 1 ppm placed Continues on amlodipine  10 Coreg  3.125 twice daily-for pressure >160 hydralazine  10   CKD 3B BPH Continue bicarb 650 daily-monitor-hold fluids Continue Flomax  0.4    Discharge Exam: Vitals:   10/30/24 0844 10/30/24 1149  BP: 136/80 134/69  Pulse: 76 74  Resp: 18 20  Temp: 98 F (36.7 C) 97.9 F (36.6 C)  SpO2: 96% 100%    Subj on day of d/c   Awake alert no distress No chest pain Abdominal pain no rales no rhonchi Power 5/5 No lower extremity edema  Discharge Instructions   Discharge Instructions     Discharge instructions   Complete by: As directed    Make sure that if you have nausea vomiting pain fever you return to the emergency room Ensure that you get follow-up with the GI I have contacted the gastroenterology specialist to make sure that you have an appointment for an MRI in the future Notice that some your meds have changed-mainly your bicarb dose Resume your insulin  at home doses-be careful if you see your sugars drop below 120 when taking it   Increase activity slowly  Complete by: As directed       Allergies as of 10/30/2024       Reactions   Ace Inhibitors Cough   Aricept [donepezil] Diarrhea   Nightmares    Cialis [tadalafil] Other (See Comments)   Severe headache   Crestor  [rosuvastatin ] Other (See Comments)   Myalgias    Diovan [valsartan]  Other (See Comments)   stopped 10/18 by MD Douglass for increase in creatinine.   Fibrates Other (See Comments)   Unknown reaction   Fosinopril Cough   Influenza Virus Vaccine Other (See Comments)   Flu-like symptoms   Roxicodone  [oxycodone ] Other (See Comments)   Altered mental states   Tricor [fenofibrate] Rash        Medication List     TAKE these medications    allopurinol  100 MG tablet Commonly known as: ZYLOPRIM  Take 100 mg by mouth daily.   amLODipine  10 MG tablet Commonly known as: NORVASC  Take 10 mg by mouth daily.   aspirin  81 MG tablet Take 81 mg by mouth daily.   carvedilol  3.125 MG tablet Commonly known as: COREG  Take 1 tablet (3.125 mg total) by mouth 2 (two) times daily with a meal.   clindamycin 1 % lotion Commonly known as: CLEOCIN T Apply 1 application  topically 2 (two) times daily as needed (skin irritation).   cyanocobalamin  1000 MCG tablet Take 1 tablet (1,000 mcg total) by mouth daily.   esomeprazole  20 MG capsule Commonly known as: NexIUM  Take 1 capsule (20 mg total) by mouth daily.   insulin  lispro 100 UNIT/ML KwikPen Commonly known as: HumaLOG  KwikPen Inject 4 Units into the skin 3 (three) times daily. What changed:  how much to take when to take this   minocycline  100 MG capsule Commonly known as: MINOCIN  Take 200 mg by mouth 2 (two) times daily.   sodium bicarbonate  650 MG tablet Take 1 tablet (650 mg total) by mouth daily. Start taking on: October 31, 2024 What changed:  how much to take when to take this   tamsulosin  0.4 MG Caps capsule Commonly known as: FLOMAX  Take 1 capsule (0.4 mg total) by mouth daily after supper. What changed:  how much to take when to take this   Tresiba  FlexTouch 100 UNIT/ML FlexTouch Pen Generic drug: insulin  degludec Inject 20 Units into the skin daily. What changed: how much to take       Allergies[1]    The results of significant diagnostics from this hospitalization (including  imaging, microbiology, ancillary and laboratory) are listed below for reference.    Significant Diagnostic Studies: CT ABDOMEN PELVIS WO CONTRAST Result Date: 10/27/2024 CLINICAL DATA:  History of pancreatitis with abdominal pain, nausea, vomiting, and diarrhea EXAM: CT ABDOMEN AND PELVIS WITHOUT CONTRAST TECHNIQUE: Multidetector CT imaging of the abdomen and pelvis was performed following the standard protocol without IV contrast. RADIATION DOSE REDUCTION: This exam was performed according to the departmental dose-optimization program which includes automated exposure control, adjustment of the mA and/or kV according to patient size and/or use of iterative reconstruction technique. COMPARISON:  CT abdomen and pelvis dated 08/08/2023 FINDINGS: Lower chest: No focal consolidation or pulmonary nodule in the lung bases. No pleural effusion or pneumothorax demonstrated. Partially imaged heart size is normal. Partially imaged cardiac leads, one of which terminates in the right ventricle. Hepatobiliary: Unchanged scattered subcentimeter hypodensities, likely cysts. No intra or extrahepatic biliary ductal dilation. Normal gallbladder. Pancreas: Mild diffuse peripancreatic stranding. No peripancreatic fluid collection. Spleen: Normal in size without focal abnormality. Adrenals/Urinary  Tract: No adrenal nodules. Bilateral simple/minimally complicated cysts measuring up to 1.8 cm on the left (2:31). A mildly hyperattenuating 1.1 cm focus in the lateral lower pole left kidney (2:34) is incompletely characterized. Punctate nonobstructing left lower pole renal stone. No hydronephrosis. No focal bladder wall thickening. Stomach/Bowel: Normal appearance of the stomach. No evidence of bowel wall thickening, distention, or inflammatory changes. Appendix is not discretely seen. Vascular/Lymphatic: Aortic atherosclerosis. New 13 mm right inguinal lymphadenopathy (2:84). Reproductive: Prostate is unremarkable. Other: No free fluid,  fluid collection, or free air. Musculoskeletal: No acute or abnormal lytic or blastic osseous lesions. L4-5 spinal fusion hardware appear intact. Multilevel degenerative changes of the partially imaged thoracic and lumbar spine. Bilateral lower anterior abdominal wall subcutaneous soft tissue stranding may be injection related. IMPRESSION: 1. Mild diffuse peripancreatic stranding, consistent with acute interstitial edematous pancreatitis. No peripancreatic fluid collection. 2. New 13 mm right inguinal lymphadenopathy, likely reactive. Recommend correlation with physical examination of the right lower extremity. 3. A mildly hyperattenuating 1.1 cm focus in the lateral lower pole left kidney may represent a hemorrhagic/proteinaceous cyst, however is incompletely characterized. Recommend further evaluation with nonemergent renal protocol MRI abdomen. 4.  Aortic Atherosclerosis (ICD10-I70.0). Electronically Signed   By: Limin  Xu M.D.   On: 10/27/2024 19:21   CUP PACEART REMOTE DEVICE CHECK Result Date: 10/20/2024 PPM Scheduled remote reviewed. Normal device function.  Presenting rhythm: AP-VP. 7 AHR, longest 8 seconds. Next remote transmission per protocol. - CS, CVRS   Microbiology: No results found for this or any previous visit (from the past 240 hours).   Labs: Basic Metabolic Panel: Recent Labs  Lab 10/27/24 1726 10/27/24 2241 10/28/24 0547 10/29/24 0301 10/30/24 0809  NA 135 138 138 136 134*  K 4.5 4.3 4.4 4.7 4.4  CL 102 109 107 108 107  CO2 16* 17* 19* 19* 19*  GLUCOSE 390* 221* 111* 97 70  BUN 40* 38* 35* 34* 34*  CREATININE 3.10* 2.94* 2.64* 2.61* 2.82*  CALCIUM  9.6 9.1 9.3 9.1 9.1   Liver Function Tests: Recent Labs  Lab 10/27/24 1726 10/28/24 0547 10/29/24 0301 10/30/24 0809  AST 18 14* 14* 15  ALT 20 15 13 11   ALKPHOS 110 93 93 87  BILITOT 0.3 0.4 0.5 0.4  PROT 6.6 5.6* 5.3* 5.0*  ALBUMIN 3.9 3.4* 3.0* 2.8*   Recent Labs  Lab 10/27/24 1726 10/29/24 0301  LIPASE  255* 592*   No results for input(s): AMMONIA in the last 168 hours. CBC: Recent Labs  Lab 10/27/24 1726 10/28/24 0547 10/29/24 0301 10/30/24 0809  WBC 13.2* 13.0* 11.8* 8.6  NEUTROABS  --   --   --  3.3  HGB 13.2 12.5* 11.7* 10.9*  HCT 38.9* 37.2* 34.5* 32.9*  MCV 83.1 83.4 83.1 85.9  PLT 228 219 192 153   Cardiac Enzymes: No results for input(s): CKTOTAL, CKMB, CKMBINDEX, TROPONINI in the last 168 hours. BNP: BNP (last 3 results) No results for input(s): BNP in the last 8760 hours.  ProBNP (last 3 results) No results for input(s): PROBNP in the last 8760 hours.  CBG: Recent Labs  Lab 10/29/24 1127 10/29/24 1701 10/29/24 2123 10/30/24 0625 10/30/24 1155  GLUCAP 138* 157* 148* 84 125*    Signed:  Colen Grimes MD   [facility] 10/30/2024, 3:44 PM      [1]  Allergies Allergen Reactions   Ace Inhibitors Cough   Aricept [Donepezil] Diarrhea    Nightmares    Cialis [Tadalafil] Other (See Comments)  Severe headache   Crestor  [Rosuvastatin ] Other (See Comments)    Myalgias    Diovan [Valsartan] Other (See Comments)    stopped 10/18 by MD Douglass for increase in creatinine.   Fibrates Other (See Comments)    Unknown reaction   Fosinopril Cough   Influenza Virus Vaccine Other (See Comments)    Flu-like symptoms    Roxicodone  [Oxycodone ] Other (See Comments)    Altered mental states   Tricor [Fenofibrate] Rash

## 2024-10-30 NOTE — Progress Notes (Signed)
 Mobility Specialist: Progress Note   10/30/24 1500  Mobility  Activity Ambulated with assistance  Level of Assistance Contact guard assist, steadying assist  Assistive Device Cane  Distance Ambulated (ft) 125 ft  Activity Response Tolerated well  Mobility Referral Yes  Mobility visit 1 Mobility  Mobility Specialist Start Time (ACUTE ONLY) 0940  Mobility Specialist Stop Time (ACUTE ONLY) 1000  Mobility Specialist Time Calculation (min) (ACUTE ONLY) 20 min    Pt received in bed, agreeable to mobility session. SV for bed mobility. Light minA for STS, CGA for ambulation with SPC. Mild unsteadiness but no overt LOB. Returned to room. SpO2 WFL on RA. Sat on a straight back chair during linen change. Heavy minA for STS from straight back chair. Returned to room. Left in bed with all needs met, call bell in reach.   Ileana Lute Mobility Specialist Please contact via SecureChat or Rehab office at [phone]

## 2024-11-09 LAB — LAB REPORT - SCANNED: EGFR: 21

## 2024-11-30 ENCOUNTER — Other Ambulatory Visit: Payer: Self-pay

## 2024-11-30 ENCOUNTER — Observation Stay (HOSPITAL_COMMUNITY)
Admission: EM | Admit: 2024-11-30 | Source: Home / Self Care | Attending: Emergency Medicine | Admitting: Emergency Medicine

## 2024-11-30 ENCOUNTER — Encounter (HOSPITAL_COMMUNITY): Payer: Self-pay

## 2024-11-30 DIAGNOSIS — R739 Hyperglycemia, unspecified: Secondary | ICD-10-CM

## 2024-11-30 DIAGNOSIS — L719 Rosacea, unspecified: Secondary | ICD-10-CM | POA: Diagnosis present

## 2024-11-30 DIAGNOSIS — K859 Acute pancreatitis without necrosis or infection, unspecified: Secondary | ICD-10-CM | POA: Diagnosis present

## 2024-11-30 DIAGNOSIS — N179 Acute kidney failure, unspecified: Principal | ICD-10-CM

## 2024-11-30 DIAGNOSIS — E785 Hyperlipidemia, unspecified: Secondary | ICD-10-CM | POA: Diagnosis present

## 2024-11-30 DIAGNOSIS — E1165 Type 2 diabetes mellitus with hyperglycemia: Secondary | ICD-10-CM

## 2024-11-30 DIAGNOSIS — E872 Acidosis, unspecified: Secondary | ICD-10-CM | POA: Diagnosis present

## 2024-11-30 DIAGNOSIS — D631 Anemia in chronic kidney disease: Secondary | ICD-10-CM | POA: Diagnosis present

## 2024-11-30 DIAGNOSIS — I441 Atrioventricular block, second degree: Secondary | ICD-10-CM | POA: Diagnosis present

## 2024-11-30 DIAGNOSIS — I10 Essential (primary) hypertension: Secondary | ICD-10-CM | POA: Diagnosis present

## 2024-11-30 LAB — COMPREHENSIVE METABOLIC PANEL WITH GFR
ALT: 12 U/L (ref 0–44)
AST: 16 U/L (ref 15–41)
Albumin: 3.7 g/dL (ref 3.5–5.0)
Alkaline Phosphatase: 127 U/L — ABNORMAL HIGH (ref 38–126)
Anion gap: 16 — ABNORMAL HIGH (ref 5–15)
BUN: 48 mg/dL — ABNORMAL HIGH (ref 8–23)
CO2: 16 mmol/L — ABNORMAL LOW (ref 22–32)
Calcium: 9.8 mg/dL (ref 8.9–10.3)
Chloride: 104 mmol/L (ref 98–111)
Creatinine, Ser: 3.17 mg/dL — ABNORMAL HIGH (ref 0.61–1.24)
GFR, Estimated: 18 mL/min — ABNORMAL LOW
Glucose, Bld: 403 mg/dL — ABNORMAL HIGH (ref 70–99)
Potassium: 5 mmol/L (ref 3.5–5.1)
Sodium: 135 mmol/L (ref 135–145)
Total Bilirubin: 0.4 mg/dL (ref 0.0–1.2)
Total Protein: 6.9 g/dL (ref 6.5–8.1)

## 2024-11-30 LAB — CBC WITH DIFFERENTIAL/PLATELET
Abs Immature Granulocytes: 0.04 10*3/uL (ref 0.00–0.07)
Basophils Absolute: 0 10*3/uL (ref 0.0–0.1)
Basophils Relative: 0 %
Eosinophils Absolute: 0.4 10*3/uL (ref 0.0–0.5)
Eosinophils Relative: 3 %
HCT: 38 % — ABNORMAL LOW (ref 39.0–52.0)
Hemoglobin: 12.4 g/dL — ABNORMAL LOW (ref 13.0–17.0)
Immature Granulocytes: 0 %
Lymphocytes Relative: 34 %
Lymphs Abs: 4.1 10*3/uL — ABNORMAL HIGH (ref 0.7–4.0)
MCH: 28.8 pg (ref 26.0–34.0)
MCHC: 32.6 g/dL (ref 30.0–36.0)
MCV: 88.4 fL (ref 80.0–100.0)
Monocytes Absolute: 0.8 10*3/uL (ref 0.1–1.0)
Monocytes Relative: 7 %
Neutro Abs: 6.7 10*3/uL (ref 1.7–7.7)
Neutrophils Relative %: 56 %
Platelets: 271 10*3/uL (ref 150–400)
RBC: 4.3 MIL/uL (ref 4.22–5.81)
RDW: 15.4 % (ref 11.5–15.5)
WBC: 12 10*3/uL — ABNORMAL HIGH (ref 4.0–10.5)
nRBC: 0 % (ref 0.0–0.2)

## 2024-11-30 NOTE — ED Provider Triage Note (Signed)
 Emergency Medicine Provider Triage Evaluation Note  Keith Drake , a 89 y.o. male  was evaluated in triage.  Pt complains of essentially nothing.  He was sent here from his physician due to abnormal labs.  Review of Systems  Positive: Occasional abdominal pain none currently Negative: Syncope, chest pain  Physical Exam  BP (!) 153/82   Pulse 78   Temp 98.9 F (37.2 C) (Oral)   Resp 18   Ht 1.778 m (5' 10)   Wt 81.6 kg   SpO2 98%   BMI 25.83 kg/m  Gen:   Awake, no distress elderly male speaking clearly Resp:  Normal effort no increased work of breathing MSK:   Moves extremities without difficulty no deformity Other:  Neuro grossly intact  Medical Decision Making  Medically screening exam initiated at 3:04 PM.  Appropriate orders placed.  Tyner Codner was informed that the remainder of the evaluation will be completed by another provider, this initial triage assessment does not replace that evaluation, and the importance of remaining in the ED until their evaluation is complete.   Garrick Charleston, MD 11/30/24 [phone]

## 2024-11-30 NOTE — ED Triage Notes (Signed)
 Patient sent to ED by PCP for abnormal labs. Elevated potassium, calcium  and something with his kidneys.  He looks pale on arrival to ED but is A&Ox4 and talking on arrival.   CBG is 471 he is diabetic  184/88 72HR

## 2024-12-01 ENCOUNTER — Emergency Department (HOSPITAL_COMMUNITY)

## 2024-12-01 ENCOUNTER — Encounter (HOSPITAL_COMMUNITY): Payer: Self-pay | Admitting: Internal Medicine

## 2024-12-01 DIAGNOSIS — E1165 Type 2 diabetes mellitus with hyperglycemia: Secondary | ICD-10-CM

## 2024-12-01 LAB — BASIC METABOLIC PANEL WITH GFR
Anion gap: 15 (ref 5–15)
BUN: 49 mg/dL — ABNORMAL HIGH (ref 8–23)
CO2: 14 mmol/L — ABNORMAL LOW (ref 22–32)
Calcium: 9.1 mg/dL (ref 8.9–10.3)
Chloride: 105 mmol/L (ref 98–111)
Creatinine, Ser: 2.49 mg/dL — ABNORMAL HIGH (ref 0.61–1.24)
GFR, Estimated: 24 mL/min — ABNORMAL LOW
Glucose, Bld: 190 mg/dL — ABNORMAL HIGH (ref 70–99)
Potassium: 4.4 mmol/L (ref 3.5–5.1)
Sodium: 134 mmol/L — ABNORMAL LOW (ref 135–145)

## 2024-12-01 LAB — CBG MONITORING, ED
Glucose-Capillary: 337 mg/dL — ABNORMAL HIGH (ref 70–99)
Glucose-Capillary: 111 mg/dL — ABNORMAL HIGH (ref 70–99)
Glucose-Capillary: 117 mg/dL — ABNORMAL HIGH (ref 70–99)
Glucose-Capillary: 232 mg/dL — ABNORMAL HIGH (ref 70–99)
Glucose-Capillary: 69 mg/dL — ABNORMAL LOW (ref 70–99)

## 2024-12-01 LAB — URINALYSIS, ROUTINE W REFLEX MICROSCOPIC
Bilirubin Urine: NEGATIVE
Glucose, UA: >=500 mg/dL — AB
Hgb urine dipstick: NEGATIVE
Ketones, ur: NEGATIVE mg/dL
Leukocytes,Ua: NEGATIVE
Nitrite: NEGATIVE
Protein, ur: 100 mg/dL — AB
Specific Gravity, Urine: 1.01 (ref 1.005–1.030)
pH: 5 (ref 5.0–8.0)

## 2024-12-01 LAB — GLUCOSE, CAPILLARY
Glucose-Capillary: 67 mg/dL — ABNORMAL LOW (ref 70–99)
Glucose-Capillary: 116 mg/dL — ABNORMAL HIGH (ref 70–99)
Glucose-Capillary: 195 mg/dL — ABNORMAL HIGH (ref 70–99)

## 2024-12-01 LAB — CBC
HCT: 33.7 % — ABNORMAL LOW (ref 39.0–52.0)
Hemoglobin: 10.7 g/dL — ABNORMAL LOW (ref 13.0–17.0)
MCH: 28.2 pg (ref 26.0–34.0)
MCHC: 31.8 g/dL (ref 30.0–36.0)
MCV: 88.9 fL (ref 80.0–100.0)
Platelets: 202 10*3/uL (ref 150–400)
RBC: 3.79 MIL/uL — ABNORMAL LOW (ref 4.22–5.81)
RDW: 15.1 % (ref 11.5–15.5)
WBC: 10.8 10*3/uL — ABNORMAL HIGH (ref 4.0–10.5)
nRBC: 0 % (ref 0.0–0.2)

## 2024-12-01 LAB — CREATININE, SERUM
Creatinine, Ser: 2.82 mg/dL — ABNORMAL HIGH (ref 0.61–1.24)
GFR, Estimated: 21 mL/min — ABNORMAL LOW

## 2024-12-01 LAB — LIPASE, BLOOD: Lipase: 588 U/L — ABNORMAL HIGH (ref 11–51)

## 2024-12-01 MED ORDER — ALLOPURINOL 100 MG PO TABS
100.0000 mg | ORAL_TABLET | Freq: Every day | ORAL | Status: AC
  Administered 2024-12-01: 100 mg via ORAL
  Filled 2024-12-01: qty 1

## 2024-12-01 MED ORDER — VITAMIN B-12 1000 MCG PO TABS
1000.0000 ug | ORAL_TABLET | Freq: Every day | ORAL | Status: AC
  Administered 2024-12-01: 1000 ug via ORAL
  Filled 2024-12-01: qty 1

## 2024-12-01 MED ORDER — LACTATED RINGERS IV BOLUS
1000.0000 mL | Freq: Once | INTRAVENOUS | Status: AC
  Administered 2024-12-01: 1000 mL via INTRAVENOUS

## 2024-12-01 MED ORDER — AMLODIPINE BESYLATE 10 MG PO TABS
10.0000 mg | ORAL_TABLET | Freq: Every day | ORAL | Status: AC
  Administered 2024-12-01: 10 mg via ORAL
  Filled 2024-12-01: qty 2

## 2024-12-01 MED ORDER — INSULIN GLARGINE-YFGN 100 UNIT/ML ~~LOC~~ SOLN
20.0000 [IU] | Freq: Every day | SUBCUTANEOUS | Status: AC
  Administered 2024-12-01: 20 [IU] via SUBCUTANEOUS
  Filled 2024-12-01 (×2): qty 0.2

## 2024-12-01 MED ORDER — ASPIRIN 81 MG PO TBEC
81.0000 mg | DELAYED_RELEASE_TABLET | Freq: Every day | ORAL | Status: AC
  Administered 2024-12-01: 81 mg via ORAL
  Filled 2024-12-01: qty 1

## 2024-12-01 MED ORDER — HEPARIN SODIUM (PORCINE) 5000 UNIT/ML IJ SOLN
5000.0000 [IU] | Freq: Three times a day (TID) | INTRAMUSCULAR | Status: AC
  Administered 2024-12-01 (×2): 5000 [IU] via SUBCUTANEOUS
  Filled 2024-12-01 (×2): qty 1

## 2024-12-01 MED ORDER — MINOCYCLINE HCL 50 MG PO CAPS
200.0000 mg | ORAL_CAPSULE | Freq: Two times a day (BID) | ORAL | Status: AC
  Administered 2024-12-01 (×2): 200 mg via ORAL
  Filled 2024-12-01 (×3): qty 4

## 2024-12-01 MED ORDER — TAMSULOSIN HCL 0.4 MG PO CAPS
0.4000 mg | ORAL_CAPSULE | Freq: Every day | ORAL | Status: AC
  Administered 2024-12-01: 0.4 mg via ORAL
  Filled 2024-12-01: qty 1

## 2024-12-01 MED ORDER — INSULIN ASPART 100 UNIT/ML IJ SOLN
0.0000 [IU] | Freq: Three times a day (TID) | INTRAMUSCULAR | Status: AC
  Administered 2024-12-01: 3 [IU] via SUBCUTANEOUS
  Filled 2024-12-01: qty 3

## 2024-12-01 MED ORDER — INSULIN ASPART 100 UNIT/ML IJ SOLN
4.0000 [IU] | Freq: Three times a day (TID) | INTRAMUSCULAR | Status: AC
  Administered 2024-12-01 (×2): 4 [IU] via SUBCUTANEOUS
  Filled 2024-12-01: qty 4

## 2024-12-01 MED ORDER — INSULIN ASPART 100 UNIT/ML IV SOLN
10.0000 [IU] | Freq: Once | INTRAVENOUS | Status: AC
  Administered 2024-12-01: 10 [IU] via INTRAVENOUS
  Filled 2024-12-01: qty 10

## 2024-12-01 MED ORDER — LACTATED RINGERS IV SOLN: INTRAVENOUS | Status: AC

## 2024-12-01 MED ORDER — SODIUM BICARBONATE 650 MG PO TABS
650.0000 mg | ORAL_TABLET | Freq: Every day | ORAL | Status: AC
  Administered 2024-12-01: 650 mg via ORAL
  Filled 2024-12-01: qty 1

## 2024-12-01 MED ORDER — SODIUM CHLORIDE 0.9 % IV BOLUS
1000.0000 mL | Freq: Once | INTRAVENOUS | Status: AC
  Administered 2024-12-01: 1000 mL via INTRAVENOUS

## 2024-12-01 MED ORDER — PANTOPRAZOLE SODIUM 40 MG PO TBEC
40.0000 mg | DELAYED_RELEASE_TABLET | Freq: Every day | ORAL | Status: AC
  Administered 2024-12-01: 40 mg via ORAL
  Filled 2024-12-01: qty 1

## 2024-12-01 NOTE — H&P (Addendum)
 " History and Physical    Keith Drake [name] DOB: August 05, 1935 DOA: 11/30/2024  Patient coming from: Home.  Chief Complaint: Abnormal labs.  HPI: Keith Drake is a 89 y.o. male with history of diabetes mellitus type 2, complete heart block status post pacemaker placement, hypertension, chronic kidney disease stage III, recently admitted last month for pancreatitis cause was not clear was referred to the ER by patient's urologist after labs were found to be abnormal.  Per patient's son who lives next to patient's house patient has been having some abdominal discomfort which is persistent since his discharge last month.  Patient was admitted last month for cryptogenic pancreatitis cause of which was not clear.  Has had prior history of pancreatitis about a year ago before the last attack last month.  Patient does not drink alcohol.  Patient states he might have missed his insulin  dose yesterday because he was not feeling well.  Complains of abdominal pain mostly in the lower quadrant.  He had follow-up with the urologist when labs were done and was found to be abnormal and was referred to the ER.  Denies any chest pain or shortness of breath.  ED Course: In the ER patient's labs showed blood glucose of 403 bicarb of 16 anion gap of 16 WBC 12 hemoglobin 12.4 lipase of 588 CT abdomen pelvis does not show any acute.  Patient was given fluid bolus insulin  10 units and admitted for uncontrolled diabetes with early DKA and abdominal pain concerning for acute on chronic pancreatitis.  And is mildly elevated from baseline and is around 3.1.  Review of Systems: As per HPI, rest all negative.   Past Medical History:  Diagnosis Date   Anemia    Arthritis    OCC LEG PAIN   Bladder cancer (HCC) TCC   History of bladder carcinoma; S/P BCG TX'S ; never treated for cancer; it was a spot on my bladder (07/20/2018)   CKD (chronic kidney disease), stage III (HCC)    History of gout    on daily RX  (07/20/2018)   History of kidney stones    Hyperlipidemia    Hypertension    Impaired hearing BILATERAL    OCCASIONAL WEARS HEARING AIDS   Mobitz I    thelbert 07/20/2018   Pneumonia  ~ 1980s X 1   Presence of permanent cardiac pacemaker 07/20/2018   Right bundle branch block    Spondylolisthesis    Type II diabetes mellitus (HCC)     Past Surgical History:  Procedure Laterality Date   COLONOSCOPY  03/2004   CYSTO / RESECTIONAL BLADDER BX'S  05-01-2011   CYSTOSCOPY WITH BIOPSY  10/05/2011   Procedure: CYSTOSCOPY WITH BIOPSY;  Surgeon: Oneil JAYSON Rafter, MD;  Location[facility];  Service: Urology;  Laterality: N/A;  BLADDER BIOPSY    CYSTOSCOPY WITH LITHOLAPAXY     EXCISIONAL HEMORRHOIDECTOMY  1980s   INSERT / REPLACE / REMOVE PACEMAKER  07/20/2018   Implantation of new dual chamber permanent pacemaker   PACEMAKER IMPLANT N/A 07/20/2018   Procedure: PACEMAKER IMPLANT;  Surgeon: Francyne Headland, MD;  [facility];  Service: Cardiovascular;  Laterality: N/A;   REFRACTIVE SURGERY Bilateral    TRANSURETHRAL RESECTION OF BLADDER TUMOR  03-02-2011   US  ECHOCARDIOGRAPHY  07-11-2009   EF 55-60%     reports that he has quit smoking. His smoking use included cigars. He has never used smokeless tobacco. He reports that he does not currently use alcohol. He  reports that he does not use drugs.  Allergies[1]  Family History  Problem Relation Age of Onset   Colon cancer Brother    Cancer Brother    Cancer Brother    Diabetes Sister     Prior to Admission medications  Medication Sig Start Date End Date Taking? Authorizing Provider  allopurinol  (ZYLOPRIM ) 100 MG tablet Take 100 mg by mouth daily. 07/07/18   [provider]  amLODipine  (NORVASC ) 10 MG tablet Take 10 mg by mouth daily.  12/09/15   [provider]  aspirin  81 MG tablet Take 81 mg by mouth daily.    [provider]  clindamycin (CLEOCIN T) 1 % lotion Apply 1 application   topically 2 (two) times daily as needed (skin irritation).    [provider]  cyanocobalamin  1000 MCG tablet Take 1 tablet (1,000 mcg total) by mouth daily. 07/21/23   Caleen Qualia, MD  esomeprazole  (NEXIUM ) 20 MG capsule Take 1 capsule (20 mg total) by mouth daily. 07/21/23 01/06/25  Amin, Sumayya, MD  insulin  lispro (HUMALOG  KWIKPEN) 100 UNIT/ML KwikPen Inject 4 Units into the skin 3 (three) times daily. Patient taking differently: Inject 8 Units into the skin 2 (two) times daily with a meal. 08/11/23   Cindy Garnette POUR, MD  minocycline  (MINOCIN ) 100 MG capsule Take 200 mg by mouth 2 (two) times daily. 03/05/24   [provider]  sodium bicarbonate  650 MG tablet Take 1 tablet (650 mg total) by mouth daily. 10/31/24   Samtani, Jai-Gurmukh, MD  tamsulosin  (FLOMAX ) 0.4 MG CAPS capsule Take 1 capsule (0.4 mg total) by mouth daily after supper. Patient taking differently: Take 0.8 mg by mouth daily. 03/28/24   Elgergawy, Brayton RAMAN, MD  TRESIBA  FLEXTOUCH 100 UNIT/ML FlexTouch Pen Inject 20 Units into the skin daily. Patient taking differently: Inject 15-30 Units into the skin daily. 03/28/24   Elgergawy, Brayton RAMAN, MD    Physical Exam: Constitutional: Moderately built and nourished. Vitals:   11/30/24 1416 11/30/24 2115 12/01/24 0127  BP: (!) 153/82 (!) 139/94 (!) 154/84  Pulse: 78 73 75  Resp: 18 16 20   Temp: 98.9 F (37.2 C) 98 F (36.7 C) 98.9 F (37.2 C)  TempSrc: Oral  Oral  SpO2: 98% 100% 99%  Weight: 81.6 kg    Height: 5' 10 (1.778 m)     Eyes: Anicteric no pallor. ENMT: No discharge from the ears eyes nose or mouth. Neck: No mass felt.  No neck rigidity. Respiratory: No rhonchi or crepitations. Cardiovascular: S1-S2 heard. Abdomen: Soft nontender bowel sound present. Musculoskeletal: No edema. Skin: No rash. Neurologic: Alert awake oriented time place and person.  Moves all extremities. Psychiatric: Appears normal.  Normal affect.   Labs on Admission: I have  personally reviewed following labs and imaging studies  CBC: Recent Labs  Lab 11/30/24 1510  WBC 12.0*  NEUTROABS 6.7  HGB 12.4*  HCT 38.0*  MCV 88.4  PLT 271   Basic Metabolic Panel: Recent Labs  Lab 11/30/24 1510  NA 135  K 5.0  CL 104  CO2 16*  GLUCOSE 403*  BUN 48*  CREATININE 3.17*  CALCIUM  9.8   GFR: Estimated Creatinine Clearance: 17 mL/min (A) (by C-G formula based on SCr of 3.17 mg/dL (H)). Liver Function Tests: Recent Labs  Lab 11/30/24 1510  AST 16  ALT 12  ALKPHOS 127*  BILITOT 0.4  PROT 6.9  ALBUMIN 3.7   Recent Labs  Lab 12/01/24 0143  LIPASE 588*   No results  for input(s): AMMONIA in the last 168 hours. Coagulation Profile: No results for input(s): INR, PROTIME in the last 168 hours. Cardiac Enzymes: No results for input(s): CKTOTAL, CKMB, CKMBINDEX, TROPONINI in the last 168 hours. BNP (last 3 results) No results for input(s): PROBNP in the last 8760 hours. HbA1C: No results for input(s): HGBA1C in the last 72 hours. CBG: Recent Labs  Lab 12/01/24 0156  GLUCAP 337*   Lipid Profile: No results for input(s): CHOL, HDL, LDLCALC, TRIG, CHOLHDL, LDLDIRECT in the last 72 hours. Thyroid Function Tests: No results for input(s): TSH, T4TOTAL, FREET4, T3FREE, THYROIDAB in the last 72 hours. Anemia Panel: No results for input(s): VITAMINB12, FOLATE, FERRITIN, TIBC, IRON, RETICCTPCT in the last 72 hours. Urine analysis:    Component Value Date/Time   COLORURINE YELLOW 10/27/2024 2240   APPEARANCEUR CLEAR 10/27/2024 2240   LABSPEC 1.020 10/27/2024 2240   PHURINE 6.0 10/27/2024 2240   GLUCOSEU >=500 (A) 10/27/2024 2240   HGBUR TRACE (A) 10/27/2024 2240   BILIRUBINUR NEGATIVE 10/27/2024 2240   KETONESUR NEGATIVE 10/27/2024 2240   PROTEINUR 100 (A) 10/27/2024 2240   NITRITE NEGATIVE 10/27/2024 2240   LEUKOCYTESUR NEGATIVE 10/27/2024 2240   Sepsis  Labs: @LABRCNTIP (procalcitonin:4,lacticidven:4) )No results found for this or any previous visit (from the past 240 hours).   Radiological Exams on Admission: CT ABDOMEN PELVIS WO CONTRAST Result Date: 12/01/2024 EXAM: CT ABDOMEN AND PELVIS WITHOUT CONTRAST 12/01/2024 01:13:17 AM TECHNIQUE: CT of the abdomen and pelvis was performed without the administration of intravenous contrast. Multiplanar reformatted images are provided for review. Automated exposure control, iterative reconstruction, and/or weight-based adjustment of the mA/kV was utilized to reduce the radiation dose to as low as reasonably achievable. COMPARISON: 10/27/2024 CLINICAL HISTORY: Abdominal pain, acute, nonlocalized. FINDINGS: LOWER CHEST: No acute abnormality. LIVER: Subcentimeter hepatic cysts. GALLBLADDER AND BILE DUCTS: Gallbladder is unremarkable. No biliary ductal dilatation. SPLEEN: No acute abnormality. PANCREAS: Faint peripancreatic stranding (image 26), chronic, suggesting chronic pancreatitis. ADRENAL GLANDS: No acute abnormality. KIDNEYS, URETERS AND BLADDER: Simple bilateral renal cysts, measuring up to 2.2 cm, benign (Bosniak 1). Per consensus, no follow-up is needed for simple Bosniak type 1 and 2 renal cysts, unless the patient has a malignancy history or risk factors. Punctate nonobstructing right lower pole renal calculus. No hydronephrosis. No perinephric or periureteral stranding. Mildly trabeculated bladder. GI AND BOWEL: Stomach demonstrates no acute abnormality. There is no bowel obstruction. PERITONEUM AND RETROPERITONEUM: No ascites. No free air. VASCULATURE: Atherosclerotic calcifications of the abdominal aorta and branch vessels. LYMPH NODES: Stable 12 mm short axis right inguinal node, likely reactive. REPRODUCTIVE ORGANS: Mild prostatomegaly. BONES AND SOFT TISSUES: PLIF at L4-L5. Degenerative disc disease, most prominent at L3-L4 and L5-S1. No acute osseous abnormality. No focal soft tissue abnormality.  IMPRESSION: 1. No acute findings in the abdomen or pelvis. 2. Aortic Atherosclerosis (ICD10-I70.0). Electronically signed by: Pinkie Pebbles MD 12/01/2024 01:18 AM EST RP Workstation: [id number]     Assessment/Plan Principal Problem:   Acute pancreatitis Active Problems:   Essential hypertension   Hyperlipidemia   Second degree AV block, Mobitz type I   Anemia in chronic renal disease   Rosacea   Metabolic acidosis   Uncontrolled type 2 diabetes mellitus with hyperglycemia, with long-term current use of insulin  (HCC)    Uncontrolled diabetes mellitus type 2 with hyperglycemia in early DKA last hemoglobin A1c last month was 8.8.  Patient stated he might have missed his dose of insulin  yesterday because he was not feeling well.  Patient was given fluid bolus  and will start his home dose of Tresiba  20 units with 4 units premeals.  Recheck metabolic panel in a few hours to make sure patient is not going into overt DKA. Abdominal pain likely from acute on chronic pancreatitis.  CT abdomen is unremarkable.  Lipase is elevated.  Check triglycerides.  Full liquid diet.  Advance as tolerated.  Has had pancreatitis last month also.  Denies drinking alcohol. Acute on chronic kidney disease stage III creatinine mildly elevated from baseline.  Receiving fluids.  Follow metabolic panel.  Takes bicarb. Complete heart block status post pacemaker placement. Hypertension on amlodipine . History of gout on allopurinol . History of roasacea on minocycline . BPH on tamsulosin .  DVT prophylaxis: Heparin . Code Status: Full code. Family Communication: Discussed with patient's son. Disposition Plan: Medical floor. Consults called: None. Admission status: Observation.         [1]  Allergies Allergen Reactions   Ace Inhibitors Cough   Aricept [Donepezil] Diarrhea    Nightmares    Cialis [Tadalafil] Other (See Comments)    Severe headache   Crestor  [Rosuvastatin ] Other (See Comments)    Myalgias     Diovan [Valsartan] Other (See Comments)    stopped 10/18 by MD Douglass for increase in creatinine.   Fibrates Other (See Comments)    Unknown reaction   Fosinopril Cough   Influenza Virus Vaccine Other (See Comments)    Flu-like symptoms    Roxicodone  [Oxycodone ] Other (See Comments)    Altered mental states   Tricor [Fenofibrate] Rash   "

## 2024-12-01 NOTE — Progress Notes (Signed)
 New Admission Note:   Arrival Method: w/c Mental Orientation: aa+ox4 Telemetry: box 14 Assessment: Completed Skin: surgical incision to right side nose IV: SL Pain: denies Tubes: n/a Safety Measures: Safety Fall Prevention Plan has been given, discussed and signed Admission: Completed [facility] Orientation: Patient has been orientated to the room, unit and staff.  Family: not present  Orders have been reviewed and implemented. Will continue to monitor the patient. Call light has been placed within reach and bed alarm has been activated.   Doyal Sias, RN

## 2024-12-01 NOTE — ED Provider Notes (Signed)
 " [facility] Emergency Department Provider Note MRN:  [id number]  Arrival date & time: 12/01/24     Chief Complaint   Abnormal labs History of Present Illness   Keith Drake is a 89 y.o. year-old male with a history of hypertension, CKD, bladder cancer presenting to the ED with chief complaint of abnormal labs.  Advised to come here by primary care doctor's office for abnormal labs.  Worsening renal function.  Patient's only symptom recently has been 1 month of lower abdominal pain triggered by drinking water .  Denies fever, no cough, no chest pain or shortness of breath, no upper abdominal pain.  Endorses a history of pancreatitis.  Review of Systems  A thorough review of systems was obtained and all systems are negative except as noted in the HPI and PMH.   Patient's Health History    Past Medical History:  Diagnosis Date   Anemia    Arthritis    OCC LEG PAIN   Bladder cancer (HCC) TCC   History of bladder carcinoma; S/P BCG TX'S ; never treated for cancer; it was a spot on my bladder (07/20/2018)   CKD (chronic kidney disease), stage III (HCC)    History of gout    on daily RX (07/20/2018)   History of kidney stones    Hyperlipidemia    Hypertension    Impaired hearing BILATERAL    OCCASIONAL WEARS HEARING AIDS   Mobitz I    thelbert 07/20/2018   Pneumonia  ~ 1980s X 1   Presence of permanent cardiac pacemaker 07/20/2018   Right bundle branch block    Spondylolisthesis    Type II diabetes mellitus (HCC)     Past Surgical History:  Procedure Laterality Date   COLONOSCOPY  03/2004   CYSTO / RESECTIONAL BLADDER BX'S  05-01-2011   CYSTOSCOPY WITH BIOPSY  10/05/2011   Procedure: CYSTOSCOPY WITH BIOPSY;  Surgeon: Oneil JAYSON Rafter, MD;  Location: [facility];  Service: Urology;  Laterality: N/A;  BLADDER BIOPSY    CYSTOSCOPY WITH LITHOLAPAXY     EXCISIONAL HEMORRHOIDECTOMY  1980s   INSERT / REPLACE / REMOVE PACEMAKER  07/20/2018    Implantation of new dual chamber permanent pacemaker   PACEMAKER IMPLANT N/A 07/20/2018   Procedure: PACEMAKER IMPLANT;  Surgeon: Francyne Headland, MD;  [facility];  Service: Cardiovascular;  Laterality: N/A;   REFRACTIVE SURGERY Bilateral    TRANSURETHRAL RESECTION OF BLADDER TUMOR  03-02-2011   US  ECHOCARDIOGRAPHY  07-11-2009   EF 55-60%    Family History  Problem Relation Age of Onset   Colon cancer Brother    Cancer Brother    Cancer Brother    Diabetes Sister     Social History   Socioeconomic History   Marital status: Divorced    Spouse name: Not on file   Number of children: Not on file   Years of education: Not on file   Highest education level: Not on file  Occupational History   Not on file  Tobacco Use   Smoking status: Former    Types: Cigars   Smokeless tobacco: Never  Vaping Use   Vaping status: Never Used  Substance and Sexual Activity   Alcohol use: Not Currently    Comment: 08/12/18 1 beer/year   Drug use: Never   Sexual activity: Not Currently  Other Topics Concern   Not on file  Social History Narrative   Not on file   Social Drivers of Health  Tobacco Use: Medium Risk (11/30/2024)   Patient History    Smoking Tobacco Use: Former    Smokeless Tobacco Use: Never    Passive Exposure: Not on Actuary Strain: Not on file  Food Insecurity: No Food Insecurity (03/25/2024)   Hunger Vital Sign    Worried About Running Out of Food in the Last Year: Never true    Ran Out of Food in the Last Year: Never true  Transportation Needs: No Transportation Needs (03/25/2024)   PRAPARE - Administrator, Civil Service (Medical): No    Lack of Transportation (Non-Medical): No  Physical Activity: Not on file  Stress: Not on file  Social Connections: Socially Isolated (03/25/2024)   Social Connection and Isolation Panel    Frequency of Communication with Friends and Family: More than three times a week    Frequency of  Social Gatherings with Friends and Family: More than three times a week    Attends Religious Services: Never    Database Administrator or Organizations: No    Attends Banker Meetings: Patient declined    Marital Status: Divorced  Catering Manager Violence: Not At Risk (03/25/2024)   Humiliation, Afraid, Rape, and Kick questionnaire    Fear of Current or Ex-Partner: No    Emotionally Abused: No    Physically Abused: No    Sexually Abused: No  Depression (PHQ2-9): Not on file  Alcohol Screen: Not on file  Housing: Unknown (03/25/2024)   Housing Stability Vital Sign    Unable to Pay for Housing in the Last Year: No    Number of Times Moved in the Last Year: Not on file    Homeless in the Last Year: No  Utilities: Not At Risk (03/25/2024)   AHC Utilities    Threatened with loss of utilities: No  Health Literacy: Not on file     Physical Exam   Vitals:   11/30/24 2115 12/01/24 0127  BP: (!) 139/94 (!) 154/84  Pulse: 73 75  Resp: 16 20  Temp: 98 F (36.7 C) 98.9 F (37.2 C)  SpO2: 100% 99%    CONSTITUTIONAL: Well-appearing, NAD NEURO/PSYCH:  Alert and oriented x 3, no focal deficits EYES:  eyes equal and reactive ENT/NECK:  no LAD, no JVD CARDIO: Regular rate, well-perfused, normal S1 and S2 PULM:  CTAB no wheezing or rhonchi GI/GU:  non-distended, non-tender MSK/SPINE:  No gross deformities, no edema SKIN:  no rash, atraumatic   *Additional and/or pertinent findings included in MDM below  Diagnostic and Interventional Summary    EKG Interpretation Date/Time:    Ventricular Rate:    PR Interval:    QRS Duration:    QT Interval:    QTC Calculation:   R Axis:      Text Interpretation:         Labs Reviewed  COMPREHENSIVE METABOLIC PANEL WITH GFR - Abnormal; Notable for the following components:      Result Value   CO2 16 (*)    Glucose, Bld 403 (*)    BUN 48 (*)    Creatinine, Ser 3.17 (*)    Alkaline Phosphatase 127 (*)    GFR, Estimated 18  (*)    Anion gap 16 (*)    All other components within normal limits  CBC WITH DIFFERENTIAL/PLATELET - Abnormal; Notable for the following components:   WBC 12.0 (*)    Hemoglobin 12.4 (*)    HCT 38.0 (*)    Lymphs  Abs 4.1 (*)    All other components within normal limits  URINALYSIS, ROUTINE W REFLEX MICROSCOPIC  LIPASE, BLOOD    CT ABDOMEN PELVIS WO CONTRAST  Final Result      Medications  sodium chloride  0.9 % bolus 1,000 mL (has no administration in time range)  insulin  aspart (novoLOG ) injection 10 Units (has no administration in time range)     Procedures  /  Critical Care Procedures  ED Course and Medical Decision Making  Initial Impression and Ddx With lower abdominal pain worse with fluid intake question of urinary outflow obstruction especially given the report of worsening kidney function.  Obtaining CT abdomen.  Patient also hyperglycemic, question DKA.  Past medical/surgical history that increases complexity of ED encounter: CKD  Interpretation of Diagnostics I personally reviewed the Laboratory Testing and my interpretation is as follows: Gap Acidosis with hyperglycemia could suggest DKA  Worsening renal function  Patient Reassessment and Ultimate Disposition/Management     CT abdomen obtained, nonacute.  Consulting medicine for admission.  Patient management required discussion with the following services or consulting groups:  Hospitalist Service  Complexity of Problems Addressed Acute illness or injury that poses threat of life of bodily function  Additional Data Reviewed and Analyzed Further history obtained from: Further history from spouse/family member  Additional Factors Impacting ED Encounter Risk Consideration of hospitalization  Ozell HERO. Theadore, MD [facility] Emergency Medicine [facility] [email or web address]  Final Clinical Impressions(s) / ED Diagnoses     ICD-10-CM   1. AKI (acute kidney injury)  N17.9     2.  Hyperglycemia  R73.9       ED Discharge Orders     None        Discharge Instructions Discussed with and Provided to Patient:   Discharge Instructions   None      Theadore Ozell HERO, MD 12/01/24 0144  "

## 2024-12-01 NOTE — Plan of Care (Signed)
 PLAN OF CARE NOTE   Keith Drake is a 89 y.o. male with history of diabetes mellitus type 2, complete heart block status post pacemaker placement, hypertension, chronic kidney disease stage III, recently admitted last month for pancreatitis cause was not clear was referred to the ER by patient's urologist after labs were found to be abnormal.   Patient presented with creatinine of 3.17 (baseline appears to be 2.5-3), hemoglobin 10.8 (baseline 11-12). Patient complains of some abdominal discomfort. CT of the abdomen and pelvis done on admission shows findings consistent with chronic pancreatitis, mild prostatomegaly.  No hydronephrosis.  Patient's labs are improving with IV fluids. I called and spoke with the patient's son, Keith Drake.  [facility] due to abnormal labs which included elevated creatinine, elevated potassium, calcium . I updated his son on his current status and plan of care. Noted that his creatinine is back to baseline.  - Plan to continue IV fluids overnight, recheck labs in a.m. and likely discharge home in AM.  MDALA-GAUSI, GOLDEN PILLOW, MD 12/01/2024 3:43 PM

## 2024-12-01 NOTE — ED Notes (Signed)
 Patient transported to CT

## 2024-12-01 NOTE — ED Notes (Signed)
 [facility] unit called to notify of patient coming up to unit.

## 2024-12-01 NOTE — Plan of Care (Signed)
   Problem: Education: Goal: Knowledge of General Education information will improve Description: Including pain rating scale, medication(s)/side effects and non-pharmacologic comfort measures Outcome: Progressing   Problem: Clinical Measurements: Goal: Respiratory complications will improve Outcome: Progressing   Problem: Activity: Goal: Risk for activity intolerance will decrease Outcome: Progressing

## 2024-12-01 NOTE — Plan of Care (Signed)

## 2025-01-18 ENCOUNTER — Encounter

## 2025-04-19 ENCOUNTER — Encounter

## 2025-07-19 ENCOUNTER — Encounter

## 2025-10-22 ENCOUNTER — Encounter

## 2026-01-17 ENCOUNTER — Encounter
